# Patient Record
Sex: Male | Born: 1945 | Race: White | Hispanic: No | Marital: Married | Smoking: Former smoker
Health system: Southern US, Community
[De-identification: ages and names within clinical notes are randomized; demographics above are authoritative.]

## PROBLEM LIST (undated history)

## (undated) DIAGNOSIS — M1990 Unspecified osteoarthritis, unspecified site: Secondary | ICD-10-CM

## (undated) DIAGNOSIS — I639 Cerebral infarction, unspecified: Secondary | ICD-10-CM

## (undated) DIAGNOSIS — R519 Headache, unspecified: Secondary | ICD-10-CM

## (undated) DIAGNOSIS — J45909 Unspecified asthma, uncomplicated: Secondary | ICD-10-CM

## (undated) DIAGNOSIS — H269 Unspecified cataract: Secondary | ICD-10-CM

## (undated) DIAGNOSIS — T7840XA Allergy, unspecified, initial encounter: Secondary | ICD-10-CM

## (undated) HISTORY — PX: SHOULDER ARTHROSCOPY: SHX128

## (undated) HISTORY — PX: SKIN GRAFT FULL THICKNESS TRUNK: SUR1300

## (undated) HISTORY — DX: Unspecified asthma, uncomplicated: J45.909

## (undated) HISTORY — DX: Allergy, unspecified, initial encounter: T78.40XA

## (undated) HISTORY — DX: Unspecified cataract: H26.9

## (undated) HISTORY — DX: Cerebral infarction, unspecified: I63.9

## (undated) HISTORY — DX: Unspecified osteoarthritis, unspecified site: M19.90

## (undated) HISTORY — DX: Headache, unspecified: R51.9

## (undated) HISTORY — PX: VASECTOMY: SHX75

## (undated) HISTORY — PX: EYE SURGERY: SHX253

## (undated) HISTORY — PX: FRACTURE SURGERY: SHX138

---

## 2003-11-28 HISTORY — PX: BACK SURGERY: SHX140

## 2013-12-08 DIAGNOSIS — L84 Corns and callosities: Secondary | ICD-10-CM | POA: Diagnosis not present

## 2013-12-08 DIAGNOSIS — B351 Tinea unguium: Secondary | ICD-10-CM | POA: Diagnosis not present

## 2014-01-16 DIAGNOSIS — Z125 Encounter for screening for malignant neoplasm of prostate: Secondary | ICD-10-CM | POA: Diagnosis not present

## 2014-01-16 DIAGNOSIS — Z139 Encounter for screening, unspecified: Secondary | ICD-10-CM | POA: Diagnosis not present

## 2014-01-16 DIAGNOSIS — J310 Chronic rhinitis: Secondary | ICD-10-CM | POA: Diagnosis not present

## 2014-01-16 DIAGNOSIS — IMO0002 Reserved for concepts with insufficient information to code with codable children: Secondary | ICD-10-CM | POA: Diagnosis not present

## 2014-01-16 DIAGNOSIS — Z0000 Encounter for general adult medical examination without abnormal findings: Secondary | ICD-10-CM | POA: Diagnosis not present

## 2014-01-16 DIAGNOSIS — J45909 Unspecified asthma, uncomplicated: Secondary | ICD-10-CM | POA: Diagnosis not present

## 2014-01-16 DIAGNOSIS — D126 Benign neoplasm of colon, unspecified: Secondary | ICD-10-CM | POA: Diagnosis not present

## 2014-01-27 DIAGNOSIS — Z872 Personal history of diseases of the skin and subcutaneous tissue: Secondary | ICD-10-CM | POA: Diagnosis not present

## 2014-01-27 DIAGNOSIS — L723 Sebaceous cyst: Secondary | ICD-10-CM | POA: Diagnosis not present

## 2014-01-27 DIAGNOSIS — J45909 Unspecified asthma, uncomplicated: Secondary | ICD-10-CM | POA: Diagnosis not present

## 2014-01-27 DIAGNOSIS — D1801 Hemangioma of skin and subcutaneous tissue: Secondary | ICD-10-CM | POA: Diagnosis not present

## 2014-02-09 DIAGNOSIS — B351 Tinea unguium: Secondary | ICD-10-CM | POA: Diagnosis not present

## 2014-02-09 DIAGNOSIS — L84 Corns and callosities: Secondary | ICD-10-CM | POA: Diagnosis not present

## 2014-02-09 DIAGNOSIS — I70209 Unspecified atherosclerosis of native arteries of extremities, unspecified extremity: Secondary | ICD-10-CM | POA: Diagnosis not present

## 2014-02-20 DIAGNOSIS — H35369 Drusen (degenerative) of macula, unspecified eye: Secondary | ICD-10-CM | POA: Diagnosis not present

## 2014-02-20 DIAGNOSIS — H2510 Age-related nuclear cataract, unspecified eye: Secondary | ICD-10-CM | POA: Diagnosis not present

## 2014-02-20 DIAGNOSIS — H3530 Unspecified macular degeneration: Secondary | ICD-10-CM | POA: Diagnosis not present

## 2014-04-13 DIAGNOSIS — I70209 Unspecified atherosclerosis of native arteries of extremities, unspecified extremity: Secondary | ICD-10-CM | POA: Diagnosis not present

## 2014-04-13 DIAGNOSIS — J45909 Unspecified asthma, uncomplicated: Secondary | ICD-10-CM | POA: Diagnosis not present

## 2014-04-13 DIAGNOSIS — L84 Corns and callosities: Secondary | ICD-10-CM | POA: Diagnosis not present

## 2014-04-13 DIAGNOSIS — L905 Scar conditions and fibrosis of skin: Secondary | ICD-10-CM | POA: Diagnosis not present

## 2014-04-13 DIAGNOSIS — L723 Sebaceous cyst: Secondary | ICD-10-CM | POA: Diagnosis not present

## 2014-04-13 DIAGNOSIS — B351 Tinea unguium: Secondary | ICD-10-CM | POA: Diagnosis not present

## 2014-06-15 DIAGNOSIS — I70209 Unspecified atherosclerosis of native arteries of extremities, unspecified extremity: Secondary | ICD-10-CM | POA: Diagnosis not present

## 2014-06-15 DIAGNOSIS — B351 Tinea unguium: Secondary | ICD-10-CM | POA: Diagnosis not present

## 2014-06-15 DIAGNOSIS — L84 Corns and callosities: Secondary | ICD-10-CM | POA: Diagnosis not present

## 2014-07-09 DIAGNOSIS — J45909 Unspecified asthma, uncomplicated: Secondary | ICD-10-CM | POA: Diagnosis not present

## 2014-07-09 DIAGNOSIS — Z0000 Encounter for general adult medical examination without abnormal findings: Secondary | ICD-10-CM | POA: Diagnosis not present

## 2014-08-04 DIAGNOSIS — J45909 Unspecified asthma, uncomplicated: Secondary | ICD-10-CM | POA: Diagnosis not present

## 2014-08-18 DIAGNOSIS — B351 Tinea unguium: Secondary | ICD-10-CM | POA: Diagnosis not present

## 2014-08-18 DIAGNOSIS — M79609 Pain in unspecified limb: Secondary | ICD-10-CM | POA: Diagnosis not present

## 2014-09-12 DIAGNOSIS — Z23 Encounter for immunization: Secondary | ICD-10-CM | POA: Diagnosis not present

## 2014-10-20 DIAGNOSIS — B351 Tinea unguium: Secondary | ICD-10-CM | POA: Diagnosis not present

## 2014-10-20 DIAGNOSIS — M79675 Pain in left toe(s): Secondary | ICD-10-CM | POA: Diagnosis not present

## 2014-10-20 DIAGNOSIS — M79674 Pain in right toe(s): Secondary | ICD-10-CM | POA: Diagnosis not present

## 2014-12-22 DIAGNOSIS — M79674 Pain in right toe(s): Secondary | ICD-10-CM | POA: Diagnosis not present

## 2014-12-22 DIAGNOSIS — B351 Tinea unguium: Secondary | ICD-10-CM | POA: Diagnosis not present

## 2014-12-22 DIAGNOSIS — M79675 Pain in left toe(s): Secondary | ICD-10-CM | POA: Diagnosis not present

## 2015-01-18 DIAGNOSIS — Z125 Encounter for screening for malignant neoplasm of prostate: Secondary | ICD-10-CM | POA: Diagnosis not present

## 2015-01-18 DIAGNOSIS — Z0000 Encounter for general adult medical examination without abnormal findings: Secondary | ICD-10-CM | POA: Diagnosis not present

## 2015-01-18 DIAGNOSIS — Z139 Encounter for screening, unspecified: Secondary | ICD-10-CM | POA: Diagnosis not present

## 2015-01-18 DIAGNOSIS — D126 Benign neoplasm of colon, unspecified: Secondary | ICD-10-CM | POA: Diagnosis not present

## 2015-01-18 DIAGNOSIS — M5417 Radiculopathy, lumbosacral region: Secondary | ICD-10-CM | POA: Diagnosis not present

## 2015-01-18 DIAGNOSIS — Z23 Encounter for immunization: Secondary | ICD-10-CM | POA: Diagnosis not present

## 2015-01-18 DIAGNOSIS — J4530 Mild persistent asthma, uncomplicated: Secondary | ICD-10-CM | POA: Diagnosis not present

## 2015-02-22 DIAGNOSIS — H35363 Drusen (degenerative) of macula, bilateral: Secondary | ICD-10-CM | POA: Diagnosis not present

## 2015-02-23 DIAGNOSIS — B351 Tinea unguium: Secondary | ICD-10-CM | POA: Diagnosis not present

## 2015-02-23 DIAGNOSIS — M79675 Pain in left toe(s): Secondary | ICD-10-CM | POA: Diagnosis not present

## 2015-02-23 DIAGNOSIS — M79674 Pain in right toe(s): Secondary | ICD-10-CM | POA: Diagnosis not present

## 2015-02-25 DIAGNOSIS — K635 Polyp of colon: Secondary | ICD-10-CM | POA: Diagnosis not present

## 2015-02-25 DIAGNOSIS — D124 Benign neoplasm of descending colon: Secondary | ICD-10-CM | POA: Diagnosis not present

## 2015-02-25 DIAGNOSIS — D120 Benign neoplasm of cecum: Secondary | ICD-10-CM | POA: Diagnosis not present

## 2015-02-25 DIAGNOSIS — Z86010 Personal history of colonic polyps: Secondary | ICD-10-CM | POA: Diagnosis not present

## 2015-02-25 DIAGNOSIS — K648 Other hemorrhoids: Secondary | ICD-10-CM | POA: Diagnosis not present

## 2015-02-25 DIAGNOSIS — D123 Benign neoplasm of transverse colon: Secondary | ICD-10-CM | POA: Diagnosis not present

## 2015-05-04 DIAGNOSIS — B353 Tinea pedis: Secondary | ICD-10-CM | POA: Diagnosis not present

## 2015-05-04 DIAGNOSIS — B351 Tinea unguium: Secondary | ICD-10-CM | POA: Diagnosis not present

## 2015-05-04 DIAGNOSIS — M79675 Pain in left toe(s): Secondary | ICD-10-CM | POA: Diagnosis not present

## 2015-05-04 DIAGNOSIS — M79674 Pain in right toe(s): Secondary | ICD-10-CM | POA: Diagnosis not present

## 2015-07-06 DIAGNOSIS — M79675 Pain in left toe(s): Secondary | ICD-10-CM | POA: Diagnosis not present

## 2015-07-06 DIAGNOSIS — M79674 Pain in right toe(s): Secondary | ICD-10-CM | POA: Diagnosis not present

## 2015-07-06 DIAGNOSIS — B351 Tinea unguium: Secondary | ICD-10-CM | POA: Diagnosis not present

## 2015-07-06 DIAGNOSIS — B353 Tinea pedis: Secondary | ICD-10-CM | POA: Diagnosis not present

## 2015-08-17 DIAGNOSIS — H60501 Unspecified acute noninfective otitis externa, right ear: Secondary | ICD-10-CM | POA: Diagnosis not present

## 2015-08-18 DIAGNOSIS — Z23 Encounter for immunization: Secondary | ICD-10-CM | POA: Diagnosis not present

## 2015-09-01 DIAGNOSIS — Z139 Encounter for screening, unspecified: Secondary | ICD-10-CM | POA: Diagnosis not present

## 2015-09-01 DIAGNOSIS — Z6835 Body mass index (BMI) 35.0-35.9, adult: Secondary | ICD-10-CM | POA: Diagnosis not present

## 2015-09-01 DIAGNOSIS — H6121 Impacted cerumen, right ear: Secondary | ICD-10-CM | POA: Diagnosis not present

## 2015-09-01 DIAGNOSIS — H60331 Swimmer's ear, right ear: Secondary | ICD-10-CM | POA: Diagnosis not present

## 2015-10-25 ENCOUNTER — Ambulatory Visit (INDEPENDENT_AMBULATORY_CARE_PROVIDER_SITE_OTHER): Payer: Medicare Other

## 2015-10-25 ENCOUNTER — Encounter: Payer: Self-pay | Admitting: Family Medicine

## 2015-10-25 ENCOUNTER — Ambulatory Visit (INDEPENDENT_AMBULATORY_CARE_PROVIDER_SITE_OTHER): Payer: Medicare Other | Admitting: Family Medicine

## 2015-10-25 VITALS — BP 106/79 | HR 73 | Temp 98.3°F | Ht 70.0 in | Wt 252.4 lb

## 2015-10-25 DIAGNOSIS — M1711 Unilateral primary osteoarthritis, right knee: Secondary | ICD-10-CM | POA: Diagnosis not present

## 2015-10-25 DIAGNOSIS — M179 Osteoarthritis of knee, unspecified: Secondary | ICD-10-CM | POA: Diagnosis not present

## 2015-10-25 DIAGNOSIS — N400 Enlarged prostate without lower urinary tract symptoms: Secondary | ICD-10-CM | POA: Insufficient documentation

## 2015-10-25 DIAGNOSIS — K219 Gastro-esophageal reflux disease without esophagitis: Secondary | ICD-10-CM | POA: Insufficient documentation

## 2015-10-25 DIAGNOSIS — M7522 Bicipital tendinitis, left shoulder: Secondary | ICD-10-CM | POA: Diagnosis not present

## 2015-10-25 DIAGNOSIS — J45909 Unspecified asthma, uncomplicated: Secondary | ICD-10-CM | POA: Insufficient documentation

## 2015-10-25 IMAGING — CR DG KNEE 1-2V*R*
2 series · 2 of 2 positions shown · non-contrast
Comparison: None.

CLINICAL DATA: Right knee pain.  No known injury.

EXAM:
RIGHT KNEE - 1-2 VIEW

[view not recorded (1 of 2)]
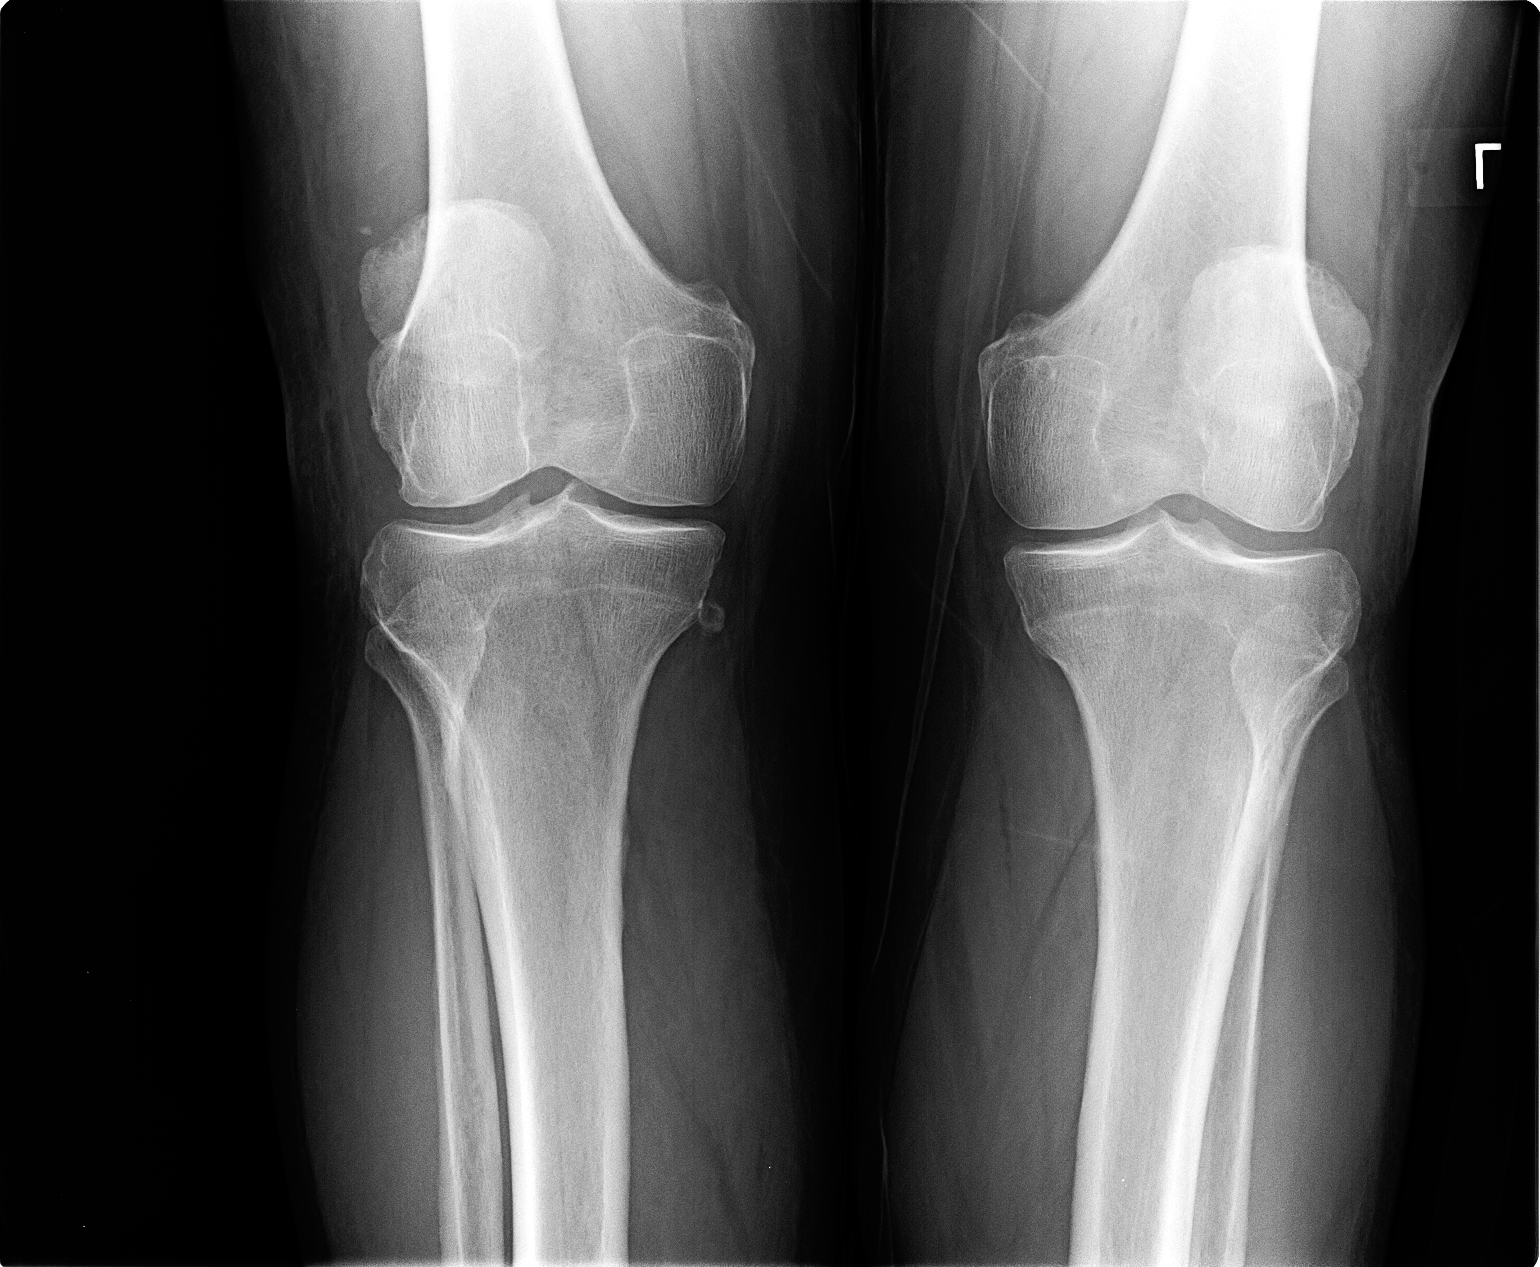

[view not recorded (2 of 2)]
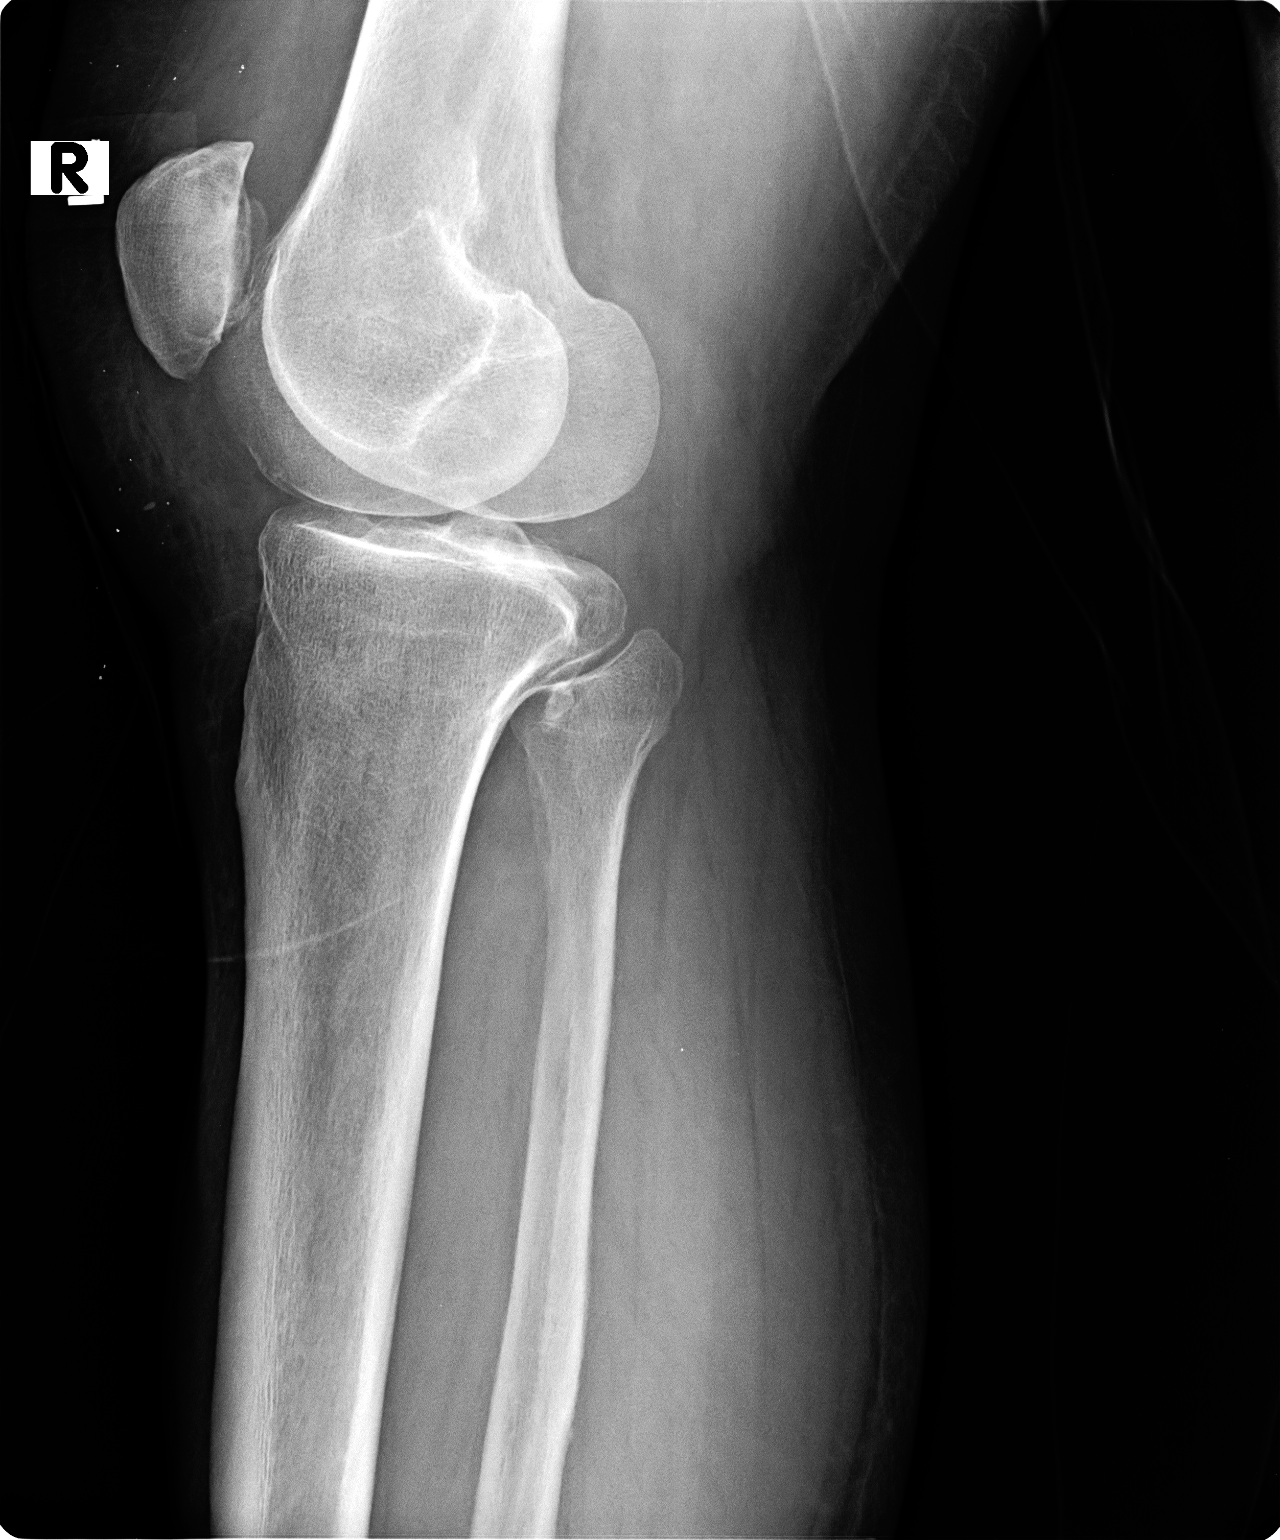

[2 of 2 positions shown; findings below may reference images not displayed]

FINDINGS: No fracture. No bone lesion. The femoral tibial joint space
compartments are relatively well preserved.

There is mild subchondral sclerosis and cystic change along the
dorsal margin of the patella. Mild edema suggested in the
infrapatellar fat.

No convincing joint effusion. Nonspecific calcification lies along
the posterior medial margin of the proximal tibia, likely vascular.
IMPRESSION: 1. Mild arthropathic changes of the patellofemoral joint space
compartment.
2. No fracture or bone lesion.  No convincing joint effusion.

## 2015-10-25 MED ORDER — METHYLPREDNISOLONE ACETATE 80 MG/ML IJ SUSP
40.0000 mg | Freq: Once | INTRAMUSCULAR | Status: AC
  Administered 2015-10-25: 40 mg via INTRAMUSCULAR

## 2015-10-25 NOTE — Progress Notes (Signed)
BP 106/79 mmHg  Pulse 73  Temp(Src) 98.3 F (36.8 C) (Oral)  Ht 5\' 10"  (1.778 m)  Wt 252 lb 6.4 oz (114.488 kg)  BMI 36.22 kg/m2   Subjective:    [name], male    DOB: [birth year], 69 y.o.   MRN: [id number]  HPI: Craig Hurley is a 69 y.o. male presenting on 10/25/2015 for Establish Care; Knee Pain; and Shoulder Pain   HPI Right knee pain Patient has had off and on recurrent right knee pain that is been worsening more recently. He had about 10 years ago an arthroscopic procedure on that knee for his meniscus. Since that point he has been having troubles with his knee off and on. Now he is having troubles with ambulating and pressure behind his knee and on the lateral side of his knee. The pain is worse with going down stairs or getting up and down from a sitting position. He denies any fevers or chills or skin changes.  Shoulder pain Patient has anterior left shoulder pain that started 1 week ago and was worsening and had trouble lifting his arm up over his head. He was taking his Celebrex and or meloxicam and it has improved. He denies any skin injuries use, denies any fevers or chills.  Relevant past medical, surgical, family and social history reviewed and updated as indicated. Interim medical history since our last visit reviewed. Allergies and medications reviewed and updated.  Review of Systems  Constitutional: Negative for fever and chills.  HENT: Negative for ear discharge and ear pain.   Eyes: Negative for discharge and visual disturbance.  Respiratory: Negative for shortness of breath and wheezing.   Cardiovascular: Negative for chest pain and leg swelling.  Gastrointestinal: Negative for abdominal pain, diarrhea and constipation.  Genitourinary: Negative for difficulty urinating.  Musculoskeletal: Positive for myalgias, joint swelling and arthralgias. Negative for back pain and gait problem.  Skin: Negative for color change and rash.    Neurological: Negative for syncope, light-headedness and headaches.  All other systems reviewed and are negative.   Per HPI unless specifically indicated above  Social History   Social History  . Marital Status: Married    Spouse Name: N/A  . Number of Children: N/A  . Years of Education: N/A   Occupational History  . Not on file.   Social History Main Topics  . Smoking status: Current Some Day Smoker    Types: Cigars  . Smokeless tobacco: Current User    Types: Snuff  . Alcohol Use: No  . Drug Use: No  . Sexual Activity: Yes   Other Topics Concern  . Not on file   Social History Narrative  . No narrative on file    Past Surgical History  Procedure Laterality Date  . Fracture surgery Right   . Shoulder arthroscopy Right   . Back surgery  2005    lumbar  . Vasectomy      Family History  Problem Relation Age of Onset  . Adopted: Yes  . Cancer Mother     colon cancer      Medication List       This list is accurate as of: 10/25/15  9:28 AM.  Always use your most recent med list.               albuterol 108 (90 BASE) MCG/ACT inhaler  Commonly known as:  PROVENTIL HFA;VENTOLIN HFA  Inhale 2 puffs into the lungs every 4 (four) hours as  needed for wheezing or shortness of breath.     cetirizine 10 MG tablet  Commonly known as:  ZYRTEC  Take 10 mg by mouth daily.     doxazosin 4 MG tablet  Commonly known as:  CARDURA  Take 4 mg by mouth daily.     EYE VITAMINS PO  Take by mouth.     fluticasone 50 MCG/ACT nasal spray  Commonly known as:  FLONASE  Place 2 sprays into both nostrils daily.     Fluticasone-Salmeterol 250-50 MCG/DOSE Aepb  Commonly known as:  ADVAIR  Inhale 1 puff into the lungs 2 (two) times daily.     meloxicam 15 MG tablet  Commonly known as:  MOBIC  Take 15 mg by mouth daily.     montelukast 10 MG tablet  Commonly known as:  SINGULAIR  Take 10 mg by mouth at bedtime.     omeprazole 20 MG capsule  Commonly known as:   PRILOSEC  Take 20 mg by mouth daily.     vitamin A 8000 UNIT capsule  Take 8,000 Units by mouth daily.           Objective:    BP 106/79 mmHg  Pulse 73  Temp(Src) 98.3 F (36.8 C) (Oral)  Ht 5\' 10"  (1.778 m)  Wt 252 lb 6.4 oz (114.488 kg)  BMI 36.22 kg/m2  Wt Readings from Last 3 Encounters:  10/25/15 252 lb 6.4 oz (114.488 kg)    Physical Exam  Constitutional: He is oriented to person, place, and time. He appears well-developed and well-nourished. No distress.  Eyes: Conjunctivae and EOM are normal. Pupils are equal, round, and reactive to light. Right eye exhibits no discharge. No scleral icterus.  Neck: Neck supple. No thyromegaly present.  Cardiovascular: Normal rate, regular rhythm, normal heart sounds and intact distal pulses.   No murmur heard. Pulmonary/Chest: Effort normal and breath sounds normal. No respiratory distress. He has no wheezes.  Musculoskeletal: Normal range of motion. He exhibits no edema.       Right shoulder: He exhibits normal range of motion, no tenderness, no bony tenderness, no swelling, no effusion, no crepitus, no deformity, normal pulse and normal strength.       Left shoulder: He exhibits tenderness (tenderness over the long head of the biceps tendon anteriorly.). He exhibits normal range of motion, no bony tenderness, no swelling, no effusion, no deformity, no spasm and normal pulse.  Lymphadenopathy:    He has no cervical adenopathy.  Neurological: He is alert and oriented to person, place, and time. Coordination normal.  Skin: Skin is warm and dry. No rash noted. He is not diaphoretic.  Psychiatric: He has a normal mood and affect. His behavior is normal.  Vitals reviewed.   Knee x-ray: Bilateral knee showed joint space narrowing and osteophytes consistent with osteoarthritis. Narrowing is worse on medial aspect.  Knee injection: Risk factors of bleeding and infection discussed with patient and patient is agreeable towards injection.  Patient prepped with Betadine. Lateral approach towards injection used. Injected 40 mg of Depo-Medrol and 1 mL of 2% lidocaine. Patient tolerated procedure well and no side effects from noted. Minimal to no bleeding. Simple bandage applied after.  No results found for this or any previous visit.    Assessment & Plan:   Problem List Items Addressed This Visit    None    Visit Diagnoses    Osteoarthritis of right knee, unspecified osteoarthritis type    -  Primary  We'll do x-ray, if no major abnormalities we'll do an injection.    Relevant Medications    meloxicam (MOBIC) 15 MG tablet    methylPREDNISolone acetate (DEPO-MEDROL) injection 40 mg    Other Relevant Orders    DG Knee 1-2 Views Right    Biceps tendinitis, left        Biceps tendinitis has improved, continue taking anti-inflammatory        Follow up plan: Return in about 2 weeks (around 11/08/2015), or if symptoms worsen or fail to improve, for Establish care and do screening labs, come in fasting.  Caryl Pina, MD [facility] 10/25/2015, 9:28 AM

## 2015-11-08 ENCOUNTER — Ambulatory Visit (INDEPENDENT_AMBULATORY_CARE_PROVIDER_SITE_OTHER): Payer: Medicare Other | Admitting: Family Medicine

## 2015-11-08 ENCOUNTER — Encounter: Payer: Self-pay | Admitting: Family Medicine

## 2015-11-08 VITALS — BP 107/73 | HR 81 | Temp 97.4°F | Ht 70.0 in | Wt 244.4 lb

## 2015-11-08 DIAGNOSIS — E785 Hyperlipidemia, unspecified: Secondary | ICD-10-CM

## 2015-11-08 DIAGNOSIS — K219 Gastro-esophageal reflux disease without esophagitis: Secondary | ICD-10-CM | POA: Diagnosis not present

## 2015-11-08 DIAGNOSIS — N400 Enlarged prostate without lower urinary tract symptoms: Secondary | ICD-10-CM | POA: Diagnosis not present

## 2015-11-08 DIAGNOSIS — B370 Candidal stomatitis: Secondary | ICD-10-CM

## 2015-11-08 DIAGNOSIS — Z125 Encounter for screening for malignant neoplasm of prostate: Secondary | ICD-10-CM

## 2015-11-08 DIAGNOSIS — R7309 Other abnormal glucose: Secondary | ICD-10-CM | POA: Diagnosis not present

## 2015-11-08 DIAGNOSIS — J439 Emphysema, unspecified: Secondary | ICD-10-CM | POA: Diagnosis not present

## 2015-11-08 DIAGNOSIS — Z1159 Encounter for screening for other viral diseases: Secondary | ICD-10-CM | POA: Diagnosis not present

## 2015-11-08 DIAGNOSIS — M1711 Unilateral primary osteoarthritis, right knee: Secondary | ICD-10-CM

## 2015-11-08 DIAGNOSIS — M179 Osteoarthritis of knee, unspecified: Secondary | ICD-10-CM | POA: Diagnosis not present

## 2015-11-08 MED ORDER — CETIRIZINE HCL 10 MG PO TABS: 10.0000 mg | ORAL_TABLET | Freq: Every day | ORAL | Status: DC

## 2015-11-08 MED ORDER — MONTELUKAST SODIUM 10 MG PO TABS: 10.0000 mg | ORAL_TABLET | Freq: Every day | ORAL | Status: DC

## 2015-11-08 MED ORDER — FLUTICASONE-SALMETEROL 250-50 MCG/DOSE IN AEPB: 1.0000 | INHALATION_SPRAY | Freq: Two times a day (BID) | RESPIRATORY_TRACT | Status: DC

## 2015-11-08 MED ORDER — DOXAZOSIN MESYLATE 4 MG PO TABS: 4.0000 mg | ORAL_TABLET | Freq: Every day | ORAL | Status: DC

## 2015-11-08 MED ORDER — FLUTICASONE PROPIONATE 50 MCG/ACT NA SUSP: 2.0000 | Freq: Every day | NASAL | Status: DC

## 2015-11-08 MED ORDER — OMEPRAZOLE 20 MG PO CPDR: 20.0000 mg | DELAYED_RELEASE_CAPSULE | Freq: Every day | ORAL | Status: DC

## 2015-11-08 MED ORDER — FLUCONAZOLE 100 MG PO TABS: 100.0000 mg | ORAL_TABLET | Freq: Every day | ORAL | Status: DC

## 2015-11-08 MED ORDER — ALBUTEROL SULFATE HFA 108 (90 BASE) MCG/ACT IN AERS: 2.0000 | INHALATION_SPRAY | RESPIRATORY_TRACT | Status: DC | PRN

## 2015-11-08 MED ORDER — CELECOXIB 200 MG PO CAPS: 200.0000 mg | ORAL_CAPSULE | Freq: Every day | ORAL | Status: DC

## 2015-11-08 NOTE — Progress Notes (Signed)
BP 107/73 mmHg  Pulse 81  Temp(Src) 97.4 F (36.3 C) (Oral)  Ht 5' 10"  (1.778 m)  Wt 244 lb 6.4 oz (110.859 kg)  BMI 35.07 kg/m2   Subjective:    [name], male    DOB: 21-Sep-1946, 69 y.o.   MRN: [id number]  HPI: Craig Hurley is a 69 y.o. male presenting on 11/08/2015 for Establish Care and Labwork   HPI COPD Patient has been diagnosed with COPD/asthma about 5 years ago. He has been a chronic smoker his whole life. He has both Advair and rescue inhaler. Currently he is using his rescue inhaler less than once a month. He denies any regular nighttime symptoms or daytime wheezing or coughing spells. He does have a cough intermittently that is nonproductive but it does not limit him. He denies any shortness of breath or chest pain.  GERD He has had severe GERD in the past and has been on omeprazole for this. It has been controlled on omeprazole and he denies any symptoms of abdominal pain or heartburn currently.  BPH He takes doxazosin for his BPH he gets up twice a night to urinate but feels like his stream is doing really well and is not having issues with starting or stopping it. He does not feel like he can't empty his bladder.  Hyperlipidemia Patient has been diagnosed with hyperlipidemia in the past, he is not currently on any medications for it. He is due for a lab check today and we will check his levels and see where he is at.  Right knee pain Patient has persistent right knee pain that is improved after the injection but is still there all the time. The sharp pain is mostly resolved but the dull achy pain persists. He feels like he is at a point where he is ready to go see the orthopedic surgeon again. In his history he has had 2 arthroscopic procedures on that knee as well.  Mouth sores Patient did not know that he had sores in his mouth and does not feel any pain from this. He did not know they were there before exam.  Relevant past medical,  surgical, family and social history reviewed and updated as indicated. Interim medical history since our last visit reviewed. Allergies and medications reviewed and updated.  Review of Systems  Constitutional: Negative for fever and chills.  HENT: Negative for congestion, ear discharge, ear pain, mouth sores and sore throat.   Eyes: Negative for discharge and visual disturbance.  Respiratory: Negative for cough, chest tightness, shortness of breath and wheezing.   Cardiovascular: Negative for chest pain and leg swelling.  Gastrointestinal: Negative for abdominal pain, diarrhea and constipation.  Genitourinary: Negative for difficulty urinating.  Musculoskeletal: Positive for myalgias, joint swelling and arthralgias (some improvement after injection but still persists with dull achy pain). Negative for back pain and gait problem.  Skin: Negative for color change and rash.  Neurological: Negative for dizziness, syncope, light-headedness and headaches.  All other systems reviewed and are negative.   Per HPI unless specifically indicated above     Medication List       This list is accurate as of: 11/08/15  9:53 AM.  Always use your most recent med list.               acetaminophen 325 MG tablet  Commonly known as:  TYLENOL  Take 650 mg by mouth every 6 (six) hours as needed.     albuterol 108 (90  BASE) MCG/ACT inhaler  Commonly known as:  PROVENTIL HFA;VENTOLIN HFA  Inhale 2 puffs into the lungs every 4 (four) hours as needed for wheezing or shortness of breath.     celecoxib 200 MG capsule  Commonly known as:  CELEBREX  Take 1 capsule (200 mg total) by mouth daily.     cetirizine 10 MG tablet  Commonly known as:  ZYRTEC  Take 1 tablet (10 mg total) by mouth daily.     doxazosin 4 MG tablet  Commonly known as:  CARDURA  Take 1 tablet (4 mg total) by mouth daily.     EYE VITAMINS PO  Take by mouth.     fluconazole 100 MG tablet  Commonly known as:  DIFLUCAN  Take 1  tablet (100 mg total) by mouth daily. Take 2 on first day and then one per day     fluticasone 50 MCG/ACT nasal spray  Commonly known as:  FLONASE  Place 2 sprays into both nostrils daily.     Fluticasone-Salmeterol 250-50 MCG/DOSE Aepb  Commonly known as:  ADVAIR  Inhale 1 puff into the lungs 2 (two) times daily.     meloxicam 15 MG tablet  Commonly known as:  MOBIC  Take 15 mg by mouth daily.     montelukast 10 MG tablet  Commonly known as:  SINGULAIR  Take 1 tablet (10 mg total) by mouth at bedtime.     omeprazole 20 MG capsule  Commonly known as:  PRILOSEC  Take 1 capsule (20 mg total) by mouth daily.     vitamin A 8000 UNIT capsule  Take 8,000 Units by mouth daily.           Objective:    BP 107/73 mmHg  Pulse 81  Temp(Src) 97.4 F (36.3 C) (Oral)  Ht 5' 10"  (1.778 m)  Wt 244 lb 6.4 oz (110.859 kg)  BMI 35.07 kg/m2  Wt Readings from Last 3 Encounters:  11/08/15 244 lb 6.4 oz (110.859 kg)  10/25/15 252 lb 6.4 oz (114.488 kg)    Physical Exam  Constitutional: He is oriented to person, place, and time. He appears well-developed and well-nourished. No distress.  HENT:  Right Ear: Tympanic membrane, external ear and ear canal normal.  Left Ear: Tympanic membrane, external ear and ear canal normal.  Nose: Nose normal.  Mouth/Throat: Uvula is midline. Oropharyngeal exudate (appears to have oral thrush on his upper palate, likely from Advair.) present.  Has bilateral hearing aids  Eyes: Conjunctivae and EOM are normal. Pupils are equal, round, and reactive to light. Right eye exhibits no discharge. No scleral icterus.  Neck: Neck supple. No thyromegaly present.  Cardiovascular: Normal rate, regular rhythm, normal heart sounds and intact distal pulses.   No murmur heard. Pulmonary/Chest: Effort normal and breath sounds normal. No respiratory distress. He has no wheezes.  Musculoskeletal: Normal range of motion. He exhibits no edema.       Right shoulder: He  exhibits normal range of motion, no tenderness, no bony tenderness, no swelling, no effusion, no crepitus, no deformity, normal pulse and normal strength.       Left shoulder: He exhibits tenderness (tenderness over the long head of the biceps tendon anteriorly.). He exhibits normal range of motion, no bony tenderness, no swelling, no effusion, no deformity, no spasm and normal pulse.  Lymphadenopathy:    He has no cervical adenopathy.  Neurological: He is alert and oriented to person, place, and time. Coordination normal.  Skin: Skin is warm  and dry. No rash noted. He is not diaphoretic.  Psychiatric: He has a normal mood and affect. His behavior is normal.  Vitals reviewed.   No results found for this or any previous visit.    Assessment & Plan:   Problem List Items Addressed This Visit      Respiratory   COPD (chronic obstructive pulmonary disease) with emphysema (HCC)   Relevant Medications   montelukast (SINGULAIR) 10 MG tablet   Fluticasone-Salmeterol (ADVAIR) 250-50 MCG/DOSE AEPB   fluticasone (FLONASE) 50 MCG/ACT nasal spray   cetirizine (ZYRTEC) 10 MG tablet   albuterol (PROVENTIL HFA;VENTOLIN HFA) 108 (90 BASE) MCG/ACT inhaler     Digestive   GERD (gastroesophageal reflux disease)   Relevant Medications   omeprazole (PRILOSEC) 20 MG capsule     Genitourinary   BPH (benign prostatic hyperplasia)   Relevant Orders   PSA, total and free    Other Visit Diagnoses    Screening PSA (prostate specific antigen)    -  Primary    Hyperlipidemia        Relevant Medications    doxazosin (CARDURA) 4 MG tablet    Other Relevant Orders    CMP14+EGFR    Lipid panel    PSA, total and free    CBC with Differential/Platelet    Need for hepatitis C screening test        Relevant Orders    Hepatitis C antibody    Osteoarthritis of right knee, unspecified osteoarthritis type        Relevant Medications    acetaminophen (TYLENOL) 325 MG tablet    celecoxib (CELEBREX) 200 MG  capsule    Other Relevant Orders    Ambulatory referral to Orthopedic Surgery    Oral thrush        Relevant Medications    fluconazole (DIFLUCAN) 100 MG tablet         Follow up plan: Return in about 6 months (around 05/08/2016), or if symptoms worsen or fail to improve, for recheck COPD GERD.  Counseling provided for all of the vaccine components Orders Placed This Encounter  Procedures  . CMP14+EGFR  . Lipid panel  . PSA, total and free  . CBC with Differential/Platelet  . Hepatitis C antibody  . Ambulatory referral to [place] Tristina Sahagian, MD [facility] 11/08/2015, 9:53 AM

## 2015-11-09 DIAGNOSIS — M1711 Unilateral primary osteoarthritis, right knee: Secondary | ICD-10-CM | POA: Diagnosis not present

## 2015-11-09 DIAGNOSIS — M7631 Iliotibial band syndrome, right leg: Secondary | ICD-10-CM | POA: Diagnosis not present

## 2015-11-09 DIAGNOSIS — M7050 Other bursitis of knee, unspecified knee: Secondary | ICD-10-CM | POA: Diagnosis not present

## 2015-11-09 LAB — CMP14+EGFR
A/G RATIO: 1.7 (ref 1.1–2.5)
ALBUMIN: 4.2 g/dL (ref 3.5–4.8)
ALT: 11 IU/L (ref 0–44)
AST: 11 IU/L (ref 0–40)
Alkaline Phosphatase: 98 IU/L (ref 39–117)
BUN / CREAT RATIO: 18 (ref 10–22)
BUN: 18 mg/dL (ref 8–27)
Bilirubin Total: 0.4 mg/dL (ref 0.0–1.2)
CALCIUM: 9.2 mg/dL (ref 8.6–10.2)
CO2: 24 mmol/L (ref 18–29)
CREATININE: 0.98 mg/dL (ref 0.76–1.27)
Chloride: 97 mmol/L (ref 96–106)
GFR, EST AFRICAN AMERICAN: 90 mL/min/{1.73_m2} (ref >59)
GFR, EST NON AFRICAN AMERICAN: 78 mL/min/{1.73_m2} (ref >59)
GLOBULIN, TOTAL: 2.5 g/dL (ref 1.5–4.5)
Glucose: 108 mg/dL — ABNORMAL HIGH (ref 65–99)
Potassium: 4.7 mmol/L (ref 3.5–5.2)
SODIUM: 137 mmol/L (ref 134–144)
TOTAL PROTEIN: 6.7 g/dL (ref 6.0–8.5)

## 2015-11-09 LAB — LIPID PANEL
CHOL/HDL RATIO: 3.7 ratio (ref 0.0–5.0)
Cholesterol, Total: 185 mg/dL (ref 100–199)
HDL: 50 mg/dL (ref >39)
LDL CALC: 120 mg/dL — AB (ref 0–99)
TRIGLYCERIDES: 74 mg/dL (ref 0–149)
VLDL Cholesterol Cal: 15 mg/dL (ref 5–40)

## 2015-11-09 LAB — CBC WITH DIFFERENTIAL/PLATELET
Basophils Absolute: 0 10*3/uL (ref 0.0–0.2)
Basos: 0 %
EOS (ABSOLUTE): 0.1 10*3/uL (ref 0.0–0.4)
Eos: 1 %
HEMATOCRIT: 45.9 % (ref 37.5–51.0)
HEMOGLOBIN: 15.8 g/dL (ref 12.6–17.7)
IMMATURE GRANS (ABS): 0 10*3/uL (ref 0.0–0.1)
IMMATURE GRANULOCYTES: 0 %
LYMPHS ABS: 1.5 10*3/uL (ref 0.7–3.1)
Lymphs: 20 %
MCH: 28.3 pg (ref 26.6–33.0)
MCHC: 34.4 g/dL (ref 31.5–35.7)
MCV: 82 fL (ref 79–97)
MONOCYTES: 9 %
MONOS ABS: 0.7 10*3/uL (ref 0.1–0.9)
NEUTROS ABS: 5.2 10*3/uL (ref 1.4–7.0)
Neutrophils: 70 %
PLATELETS: 219 10*3/uL (ref 150–379)
RBC: 5.58 x10E6/uL (ref 4.14–5.80)
RDW: 14.8 % (ref 12.3–15.4)
WBC: 7.5 10*3/uL (ref 3.4–10.8)

## 2015-11-09 LAB — HEPATITIS C ANTIBODY: Hep C Virus Ab: <0.1 s/co ratio (ref 0.0–0.9)

## 2015-11-09 LAB — PSA, TOTAL AND FREE
PROSTATE SPECIFIC AG, SERUM: 1.7 ng/mL (ref 0.0–4.0)
PSA FREE PCT: 28.8 %
PSA, Free: 0.49 ng/mL

## 2015-11-10 ENCOUNTER — Telehealth: Payer: Self-pay

## 2015-11-10 NOTE — Telephone Encounter (Signed)
Insurance denied Celecoxib non formulary   Formulary are Naproxen, diclofenac and nabumetone

## 2015-11-10 NOTE — Telephone Encounter (Signed)
Send naproxen 250 mg 3 times a day when necessary. Send enough for a month with 1 refill

## 2015-11-11 LAB — HGB A1C W/O EAG: HEMOGLOBIN A1C: 5.9 % — AB (ref 4.8–5.6)

## 2015-11-11 LAB — SPECIMEN STATUS REPORT

## 2015-11-11 MED ORDER — NAPROXEN 250 MG PO TABS: 250.0000 mg | ORAL_TABLET | Freq: Three times a day (TID) | ORAL | Status: DC

## 2015-11-11 NOTE — Addendum Note (Signed)
Addended by: Marylin Crosby on: 11/11/2015 09:16 AM   Modules accepted: Orders

## 2015-11-23 ENCOUNTER — Ambulatory Visit: Payer: Medicare Other | Attending: Orthopedic Surgery | Admitting: Physical Therapy

## 2015-11-23 DIAGNOSIS — M25561 Pain in right knee: Secondary | ICD-10-CM | POA: Diagnosis present

## 2015-11-23 NOTE — Therapy (Signed)
[facility] [place], [place], [address] Phone: [phone]   Fax:  [phone]  Physical Therapy Evaluation  Patient Details  Name: Craig Hurley MRN: [id number] Date of Birth: [birth year] Referring Provider: Rod Can MD.  Encounter Date: 11/23/2015      PT End of Session - 11/23/15 1017    Visit Number 1   Number of Visits 12   Date for PT Re-Evaluation 01/04/16   PT Start Time 0945   Activity Tolerance Patient tolerated treatment well   Behavior During Therapy [facility] for tasks assessed/performed      Past Medical History  Diagnosis Date  . Asthma     Past Surgical History  Procedure Laterality Date  . Fracture surgery Right   . Shoulder arthroscopy Right   . Back surgery  2005    lumbar  . Vasectomy      There were no vitals filed for this visit.  Visit Diagnosis:  Right knee pain - Plan: PT plan of care cert/re-cert      Subjective Assessment - 11/23/15 0957    Subjective Pain at rest 3-4/10.   Patient Stated Goals Get out of pain.   Currently in Pain? Yes   Pain Score 4    Pain Location Knee   Pain Orientation Right   Pain Descriptors / Indicators Aching   Pain Type Chronic pain   Pain Onset More than a month ago   Pain Frequency Constant   Aggravating Factors  Prolonged driving makes it worse.   Pain Relieving Factors Rest.            [facility] PT Assessment - 11/23/15 0001    Assessment   Medical Diagnosis Pes Anersine bursitis.   Referring Provider Rod Can MD.   Onset Date/Surgical Date --  Ongoing.   Precautions   Precautions None   Restrictions   Weight Bearing Restrictions No   Balance Screen   Has the patient fallen in the past 6 months No   Has the patient had a decrease in activity level because of a fear of falling?  No   Is the patient reluctant to leave their home because of a fear of falling?  No   Home [employer] residence   Prior Function   Level of Independence Independent   Posture/Postural Control   Posture/Postural Control No significant limitations   ROM / Strength   AROM / PROM / Strength AROM;Strength   AROM   Overall AROM Comments Normal right knee active range of motion.   Strength   Overall Strength Comments Normal right LE strength.   Palpation   Palpation comment Tender to palpation over right lateral IT bursa and pain reported in area of right popliteal fossa.   Ambulation/Gait   Gait Comments WNL.                   [facility] Adult PT Treatment/Exercise - 11/23/15 0001    Modalities   Modalities Electrical Stimulation   Electrical Stimulation   Electrical Stimulation Location Right distal lateral knee.   Electrical Stimulation Action pre-mod e'stim x 20 minutes 80-150 HZ.   Electrical Stimulation Goals Pain                  PT Short Term Goals - 11/23/15 1014    PT SHORT TERM GOAL #1   Title Ind with a HEP.   Time 2   Period Weeks   Status New  PT Long Term Goals - 12-07-2015 1015    PT LONG TERM GOAL #1   Title Perform ADL's with pain not > 3/10.   Time 4   Period Weeks   Status New   PT LONG TERM GOAL #2   Title Go up and down a flight of stairs with pain not > 3/10.   Time 4   Period Weeks   Status New   PT LONG TERM GOAL #3   Title Drive 45 minutes with right knee pain-level not > 3-4/10.   Time 4   Period Weeks   Status New               Plan - Dec 07, 2015 0959    Clinical Impression Statement The patient has a long history of right knee pain.  His resting pain-level is a 4/10 today but can rise to a 6-7/10 after a long drive and then getting up.  He states he has had back problems in the past and feels some numbness on the outsode ofhis right knee at times.   Pt will benefit from skilled therapeutic intervention in order to improve on the following deficits Pain;Decreased activity tolerance   Rehab Potential Excellent   PT  Frequency 3x / week   PT Duration 4 weeks   PT Treatment/Interventions ADLs/Self Care Home Management;Electrical Stimulation;Cryotherapy;Moist Heat;Ultrasound;Therapeutic activities;Therapeutic exercise;Manual techniques;Patient/family education   PT Next Visit Plan IASTM/STW/M to right lateral distal knee f/b Combo e'stim/U/S and pain-free right quadriceps strengthening.   Consulted and Agree with Plan of Care Patient          G-Codes - 12-07-2015 1016    Functional Assessment Tool Used FOTO.   Functional Limitation Mobility: Walking and moving around   Mobility: Walking and Moving Around Current Status [phone]) At least 40 percent but less than 60 percent impaired, limited or restricted   Mobility: Walking and Moving Around Goal Status [phone]) At least 1 percent but less than 20 percent impaired, limited or restricted       Problem List Patient Active Problem List   Diagnosis Date Noted  . COPD (chronic obstructive pulmonary disease) with emphysema ([facility]) 11/08/2015  . BPH (benign prostatic hyperplasia) 10/25/2015  . GERD (gastroesophageal reflux disease) 10/25/2015    Jaydalee Bardwell, [place] MPT December 07, 2015, 10:33 AM  [facility] [place], [place], [address] Phone: [phone]   Fax:  [phone]  Name: Craig Hurley MRN: [id number] Date of Birth: [birth year]

## 2015-11-30 ENCOUNTER — Ambulatory Visit: Payer: Medicare Other | Attending: Orthopedic Surgery | Admitting: Physical Therapy

## 2015-11-30 DIAGNOSIS — M25561 Pain in right knee: Secondary | ICD-10-CM | POA: Diagnosis not present

## 2015-11-30 NOTE — Therapy (Signed)
[facility] [place], [place], [address] Phone: [phone]   Fax:  [phone]  Physical Therapy Treatment  Patient Details  Name: Craig Hurley MRN: [id number] Date of Birth: 11-28-1945 Referring Provider: Rod Can MD.  Encounter Date: 11/30/2015      PT End of Session - 11/30/15 1113    Visit Number 2   Number of Visits 12   Date for PT Re-Evaluation 01/04/16   PT Start Time 1030   PT Stop Time 1127   PT Time Calculation (min) 57 min   Activity Tolerance Patient tolerated treatment well   Behavior During Therapy [facility] for tasks assessed/performed      Past Medical History  Diagnosis Date  . Asthma     Past Surgical History  Procedure Laterality Date  . Fracture surgery Right   . Shoulder arthroscopy Right   . Back surgery  2005    lumbar  . Vasectomy      There were no vitals filed for this visit.  Visit Diagnosis:  Right knee pain      Subjective Assessment - 11/30/15 1110    Subjective Myt kne is sore to touch but is better.   Patient Stated Goals Get out of pain.   Pain Score 2    Pain Location Knee   Pain Orientation Right   Pain Descriptors / Indicators Aching   Pain Type Chronic pain   Pain Onset More than a month ago                         [facility] Adult PT Treatment/Exercise - 11/30/15 0001    Exercises   Exercises Knee/Hip   Knee/Hip Exercises: Aerobic   Nustep Level 3 x 15 minutes.   Modalities   Modalities Electrical Stimulation;Ultrasound   Electrical Stimulation   Electrical Stimulation Location Right distal lateral knee.   Electrical Stimulation Action Pre-mod constant at 80-150 HZ x 20 minutes.   Electrical Stimulation Goals Pain   Ultrasound   Ultrasound Location Right lateral distal knee.   Ultrasound Parameters 1.50 W/CM2 at 1.50 W/CM2 x 8 minutes.                  PT Short Term Goals - 11/23/15 1014    PT SHORT TERM GOAL #1   Title  Ind with a HEP.   Time 2   Period Weeks   Status New           PT Long Term Goals - 11/23/15 1015    PT LONG TERM GOAL #1   Title Perform ADL's with pain not > 3/10.   Time 4   Period Weeks   Status New   PT LONG TERM GOAL #2   Title Go up and down a flight of stairs with pain not > 3/10.   Time 4   Period Weeks   Status New   PT LONG TERM GOAL #3   Title Drive 45 minutes with right knee pain-level not > 3-4/10.   Time 4   Period Weeks   Status New               Problem List Patient Active Problem List   Diagnosis Date Noted  . COPD (chronic obstructive pulmonary disease) with emphysema ([place]) 11/08/2015  . BPH (benign prostatic hyperplasia) 10/25/2015  . GERD (gastroesophageal reflux disease) 10/25/2015    Glendel Jaggers, [place] MPT 11/30/2015, 11:47 AM  [facility] [address]  [place], [place], [address] Phone: [phone]   Fax:  [phone]  Name: Craig Hurley MRN: [id number] Date of Birth: Aug 15, 1946

## 2015-12-02 DIAGNOSIS — L110 Acquired keratosis follicularis: Secondary | ICD-10-CM | POA: Diagnosis not present

## 2015-12-02 DIAGNOSIS — M79672 Pain in left foot: Secondary | ICD-10-CM | POA: Diagnosis not present

## 2015-12-02 DIAGNOSIS — M2042 Other hammer toe(s) (acquired), left foot: Secondary | ICD-10-CM | POA: Diagnosis not present

## 2015-12-06 ENCOUNTER — Encounter: Payer: Medicare Other | Admitting: Physical Therapy

## 2015-12-09 ENCOUNTER — Ambulatory Visit: Payer: Medicare Other | Admitting: *Deleted

## 2015-12-09 DIAGNOSIS — M25561 Pain in right knee: Secondary | ICD-10-CM | POA: Diagnosis not present

## 2015-12-09 NOTE — Therapy (Signed)
[facility] [place], [place], [address] Phone: [phone]   Fax:  [phone]  Physical Therapy Treatment  Patient Details  Name: Craig Hurley MRN: [id number] Date of Birth: January 02, 1946 Referring Provider: Rod Can MD.  Encounter Date: 12/09/2015      PT End of Session - 12/09/15 1035    Visit Number 3   Number of Visits 12   Date for PT Re-Evaluation 01/04/16   PT Start Time 1030   PT Stop Time 1119   PT Time Calculation (min) 49 min      Past Medical History  Diagnosis Date  . Asthma     Past Surgical History  Procedure Laterality Date  . Fracture surgery Right   . Shoulder arthroscopy Right   . Back surgery  2005    lumbar  . Vasectomy      There were no vitals filed for this visit.  Visit Diagnosis:  Right knee pain      Subjective Assessment - 12/09/15 1033    Subjective Myt kne is sore to touch but is better. Did good after last Rx   Patient Stated Goals Get out of pain.   Currently in Pain? Yes   Pain Score 2    Pain Location Knee   Pain Orientation Right   Pain Descriptors / Indicators Aching   Pain Type Chronic pain   Pain Onset More than a month ago   Pain Frequency Constant   Aggravating Factors  prolonged driving   Pain Relieving Factors Rest, PT Rxs                         [facility] Adult PT Treatment/Exercise - 12/09/15 0001    Exercises   Exercises Knee/Hip   Knee/Hip Exercises: Aerobic   Nustep Level 4 x 15 minutes.   Modalities   Modalities Dispensing optician Stimulation Location Right distal lateral knee.premod x 15 mins 80-_0    Ultrasound   Ultrasound Location RT knee lateral  aspect   Ultrasound Parameters 1.5 w/cm2 x 10 mins   Ultrasound Goals Pain   Vasopneumatic   Number Minutes Vasopneumatic  15 minutes   Vasopnuematic Location  Knee   Vasopneumatic Pressure Medium   Vasopneumatic Temperature  36                  PT Short Term Goals - 12/09/15 1112    PT SHORT TERM GOAL #1   Title Ind with a HEP.   Time 2   Period Weeks   Status On-going           PT Long Term Goals - 12/09/15 1112    PT LONG TERM GOAL #1   Title Perform ADL's with pain not > 3/10.   Time 4   Period Weeks   Status Achieved   PT LONG TERM GOAL #2   Title Go up and down a flight of stairs with pain not > 3/10.   Period Weeks   Status Achieved   PT LONG TERM GOAL #3   Title Drive 45 minutes with right knee pain-level not > 3-4/10.   Time 4   Period Weeks   Status On-going               Plan - 12/09/15 1102    Clinical Impression Statement Pt did fairly well again with Rx and was less tender around Lateral aspect RT knee. Today's  pain level is the lowest it has been in a week. He feels he has Met 2 out of three LTGs today. Re-assess next week.   Pt will benefit from skilled therapeutic intervention in order to improve on the following deficits Pain;Decreased activity tolerance   Rehab Potential Excellent   PT Frequency 3x / week   PT Duration 4 weeks   PT Treatment/Interventions ADLs/Self Care Home Management;Electrical Stimulation;Cryotherapy;Moist Heat;Ultrasound;Therapeutic activities;Therapeutic exercise;Manual techniques;Patient/family education   PT Next Visit Plan IASTM/STW/M to right lateral distal knee f/b Combo e'stim/U/S and pain-free right quadriceps strengthening.   Consulted and Agree with Plan of Care Patient        Problem List Patient Active Problem List   Diagnosis Date Noted  . COPD (chronic obstructive pulmonary disease) with emphysema ([place]) 11/08/2015  . BPH (benign prostatic hyperplasia) 10/25/2015  . GERD (gastroesophageal reflux disease) 10/25/2015    RAMSEUR,CHRIS, PTA 12/09/2015, 11:19 AM  [facility] [place], [place], [address] Phone: [phone]   Fax:   [phone]  Name: Sammy Cassar MRN: [id number] Date of Birth: 05-01-1946

## 2015-12-14 ENCOUNTER — Ambulatory Visit: Payer: Medicare Other | Admitting: *Deleted

## 2015-12-14 DIAGNOSIS — M25561 Pain in right knee: Secondary | ICD-10-CM | POA: Diagnosis not present

## 2015-12-14 NOTE — Therapy (Signed)
[facility] [place], [place], [address] Phone: [phone]   Fax:  [phone]  Physical Therapy Treatment  Patient Details  Name: Craig Hurley MRN: [id number] Date of Birth: September 20, 1946 Referring Provider: Rod Can MD.  Encounter Date: 12/14/2015      PT End of Session - 12/14/15 1204    Visit Number 4   Number of Visits 12   Date for PT Re-Evaluation 01/04/16   PT Start Time 0945   PT Stop Time 1031   PT Time Calculation (min) 46 min      Past Medical History  Diagnosis Date  . Asthma     Past Surgical History  Procedure Laterality Date  . Fracture surgery Right   . Shoulder arthroscopy Right   . Back surgery  2005    lumbar  . Vasectomy      There were no vitals filed for this visit.  Visit Diagnosis:  Right knee pain      Subjective Assessment - 12/14/15 1209    Subjective RT knee was sore for a few days after last Rx Doing good now and am ready to DC   Pain Score 1    Pain Location Knee   Pain Orientation Right   Pain Descriptors / Indicators Aching   Pain Type Chronic pain   Pain Onset More than a month ago   Pain Frequency Intermittent   Aggravating Factors  Prolonged driving   Pain Relieving Factors PT Rxs                         [facility] Adult PT Treatment/Exercise - 12/14/15 0001    Exercises   Exercises Knee/Hip   Modalities   Modalities Electrical Stimulation;Ultrasound;Vasopneumatic   Electrical Stimulation   Electrical Stimulation Location Right distal lateral knee.premod x 15 mins 80-_0    Electrical Stimulation Goals Pain   Ultrasound   Ultrasound Location RT knee    Ultrasound Parameters 1.5 w/cm2 x 8 mins   Ultrasound Goals Pain   Vasopneumatic   Number Minutes Vasopneumatic  15 minutes   Vasopnuematic Location  Knee   Vasopneumatic Pressure Low   Vasopneumatic Temperature  36                  PT Short Term Goals - 12/14/15 1030     PT SHORT TERM GOAL #1   Title Ind with a HEP.   Period Weeks   Status Achieved           PT Long Term Goals - 12/14/15 1030    PT LONG TERM GOAL #1   Title Perform ADL's with pain not > 3/10.   Time 4   Period Weeks   Status Achieved   PT LONG TERM GOAL #2   Title Go up and down a flight of stairs with pain not > 3/10.   Period Weeks   Status Achieved   PT LONG TERM GOAL #3   Title Drive 45 minutes with right knee pain-level not > 3-4/10.   Time 4   Period Weeks   Status Achieved               Plan - 12/14/15 1207    Clinical Impression Statement Pt has met all goals and is ready to DC from PT   Pt will benefit from skilled therapeutic intervention in order to improve on the following deficits Pain;Decreased activity tolerance   Rehab Potential Excellent   PT  Frequency 3x / week   PT Duration 4 weeks   PT Treatment/Interventions ADLs/Self Care Home Management;Electrical Stimulation;Cryotherapy;Moist Heat;Ultrasound;Therapeutic activities;Therapeutic exercise;Manual techniques;Patient/family education   PT Next Visit Plan DC to home program   Consulted and Agree with Plan of Care Patient          G-Codes - 12-24-2015 January 19, 1210    Functional Assessment Tool Used  DC FOTO 39% limited and DC Gcode       Problem List Patient Active Problem List   Diagnosis Date Noted  . COPD (chronic obstructive pulmonary disease) with emphysema ([place]) 11/08/2015  . BPH (benign prostatic hyperplasia) 10/25/2015  . GERD (gastroesophageal reflux disease) 10/25/2015    Craig Hurley,Craig Hurley, Craig Hurley 12/24/2015, 12:13 PM  [facility] [place], [place], [address] Phone: [phone]   Fax:  [phone]  Name: Craig Hurley MRN: [id number] Date of Birth: Feb 06, 1946

## 2015-12-15 NOTE — Therapy (Signed)
[facility] [place], [place], [address] Phone: [phone]   Fax:  [phone]  Physical Therapy Treatment  Patient Details  Name: Craig Hurley MRN: [id number] Date of Birth: [birth year] Referring Provider: Rod Can MD.  Encounter Date: 12/14/2015      PT End of Session - 12/14/15 1204    Visit Number 4   Number of Visits 12   Date for PT Re-Evaluation 01/04/16   PT Start Time 0945   PT Stop Time 1031   PT Time Calculation (min) 46 min      Past Medical History  Diagnosis Date  . Asthma     Past Surgical History  Procedure Laterality Date  . Fracture surgery Right   . Shoulder arthroscopy Right   . Back surgery  2005    lumbar  . Vasectomy      There were no vitals filed for this visit.  Visit Diagnosis:  Right knee pain      Subjective Assessment - 12/14/15 1209    Subjective RT knee was sore for a few days after last Rx Doing good now and am ready to DC   Pain Score 1    Pain Location Knee   Pain Orientation Right   Pain Descriptors / Indicators Aching   Pain Type Chronic pain   Pain Onset More than a month ago   Pain Frequency Intermittent   Aggravating Factors  Prolonged driving   Pain Relieving Factors PT Rxs                                   PT Short Term Goals - 12/14/15 1030    PT SHORT TERM GOAL #1   Title Ind with a HEP.   Period Weeks   Status Achieved           PT Long Term Goals - 12/14/15 1030    PT LONG TERM GOAL #1   Title Perform ADL's with pain not > 3/10.   Time 4   Period Weeks   Status Achieved   PT LONG TERM GOAL #2   Title Go up and down a flight of stairs with pain not > 3/10.   Period Weeks   Status Achieved   PT LONG TERM GOAL #3   Title Drive 45 minutes with right knee pain-level not > 3-4/10.   Time 4   Period Weeks   Status Achieved               Plan - 12/14/15 1207    Clinical Impression Statement  Pt has met all goals and is ready to DC from PT   Pt will benefit from skilled therapeutic intervention in order to improve on the following deficits Pain;Decreased activity tolerance   Rehab Potential Excellent   PT Frequency 3x / week   PT Duration 4 weeks   PT Treatment/Interventions ADLs/Self Care Home Management;Electrical Stimulation;Cryotherapy;Moist Heat;Ultrasound;Therapeutic activities;Therapeutic exercise;Manual techniques;Patient/family education   PT Next Visit Plan DC to home program   Consulted and Agree with Plan of Care Patient          G-Codes - 01-14-2016 1336    Functional Assessment Tool Used  DC FOTO 39% limited and DC Gcode    Functional Limitation Mobility: Walking and moving around   Mobility: Walking and Moving Around Current Status ([id number]) At least 20 percent but less than 40 percent impaired, limited or restricted  Mobility: Walking and Moving Around Goal Status [phone]) At least 1 percent but less than 20 percent impaired, limited or restricted   Mobility: Walking and Moving Around Discharge Status [phone]) At least 20 percent but less than 40 percent impaired, limited or restricted      Problem List Patient Active Problem List   Diagnosis Date Noted  . COPD (chronic obstructive pulmonary disease) with emphysema ([place]) 11/08/2015  . BPH (benign prostatic hyperplasia) 10/25/2015  . GERD (gastroesophageal reflux disease) 10/25/2015   PHYSICAL THERAPY DISCHARGE SUMMARY  Visits from Start of Care: 4  Current functional level related to goals / functional outcomes: Please see above.   Remaining deficits: Al goals met.   Education / Equipment: HEP.  Plan: Patient agrees to discharge.  Patient goals were met. Patient is being discharged due to meeting the stated rehab goals.  ?????      Masson Nalepa, [place] MPT 12/15/2015, 1:37 PM  [facility] [address] [place], [place], [address] Phone: [phone]   Fax:   [phone]  Name: Craig Hurley MRN: [id number] Date of Birth: Aug 04, 1946

## 2016-01-18 ENCOUNTER — Telehealth: Payer: Self-pay | Admitting: Family Medicine

## 2016-01-28 DIAGNOSIS — Z0289 Encounter for other administrative examinations: Secondary | ICD-10-CM

## 2016-02-10 ENCOUNTER — Telehealth: Payer: Self-pay | Admitting: Family Medicine

## 2016-02-14 NOTE — Telephone Encounter (Signed)
Ready, pt aware 

## 2016-02-15 MED ORDER — MELOXICAM 15 MG PO TABS: 15.0000 mg | ORAL_TABLET | Freq: Every day | ORAL | Status: DC

## 2016-05-02 ENCOUNTER — Other Ambulatory Visit: Payer: Self-pay | Admitting: Family Medicine

## 2016-05-06 ENCOUNTER — Encounter: Payer: Self-pay | Admitting: Family Medicine

## 2016-05-08 ENCOUNTER — Ambulatory Visit (INDEPENDENT_AMBULATORY_CARE_PROVIDER_SITE_OTHER): Payer: Medicare Other | Admitting: Family Medicine

## 2016-05-08 ENCOUNTER — Encounter: Payer: Self-pay | Admitting: Family Medicine

## 2016-05-08 VITALS — BP 110/76 | HR 74 | Temp 98.2°F | Ht 70.0 in | Wt 235.6 lb

## 2016-05-08 DIAGNOSIS — K219 Gastro-esophageal reflux disease without esophagitis: Secondary | ICD-10-CM | POA: Diagnosis not present

## 2016-05-08 DIAGNOSIS — J439 Emphysema, unspecified: Secondary | ICD-10-CM | POA: Diagnosis not present

## 2016-05-08 DIAGNOSIS — M129 Arthropathy, unspecified: Secondary | ICD-10-CM

## 2016-05-08 DIAGNOSIS — M19012 Primary osteoarthritis, left shoulder: Secondary | ICD-10-CM

## 2016-05-08 MED ORDER — DOXAZOSIN MESYLATE 4 MG PO TABS: 4.0000 mg | ORAL_TABLET | Freq: Every day | ORAL | Status: DC

## 2016-05-08 MED ORDER — OMEPRAZOLE 20 MG PO CPDR: 20.0000 mg | DELAYED_RELEASE_CAPSULE | Freq: Every day | ORAL | Status: DC

## 2016-05-08 MED ORDER — MELOXICAM 15 MG PO TABS: 15.0000 mg | ORAL_TABLET | Freq: Every day | ORAL | Status: DC

## 2016-05-08 MED ORDER — CETIRIZINE HCL 10 MG PO TABS: 10.0000 mg | ORAL_TABLET | Freq: Every day | ORAL | Status: AC

## 2016-05-08 MED ORDER — MONTELUKAST SODIUM 10 MG PO TABS: 10.0000 mg | ORAL_TABLET | Freq: Every day | ORAL | Status: DC

## 2016-05-08 MED ORDER — FLUTICASONE-SALMETEROL 250-50 MCG/DOSE IN AEPB: 1.0000 | INHALATION_SPRAY | Freq: Two times a day (BID) | RESPIRATORY_TRACT | Status: DC

## 2016-05-08 MED ORDER — ALBUTEROL SULFATE HFA 108 (90 BASE) MCG/ACT IN AERS: 2.0000 | INHALATION_SPRAY | RESPIRATORY_TRACT | Status: DC | PRN

## 2016-05-08 NOTE — Progress Notes (Signed)
BP 110/76 mmHg  Pulse 74  Temp(Src) 98.2 F (36.8 C) (Oral)  Ht 5\' 10"  (1.778 m)  Wt 235 lb 9.6 oz (106.867 kg)  BMI 33.80 kg/m2   Subjective:    [name], male    DOB: 1946/02/06, 70 y.o.   MRN: [id number]  HPI: Craig Hurley is a 70 y.o. male presenting on 05/08/2016 for Medication refills   HPI COPD recheck Patient is coming in today for a COPD recheck. He is currently on Advair 1 puff 2 times daily and has a rescue inhaler. He says he is only having his rescue inhaler about once every 2 months. He feels like he is doing very well and only has an occasional cough or wheeze. He denies any fevers or chills or shortness of breath. He denies any nighttime symptoms and has had recurrently.  GERD recheck Patient is currently taking omeprazole and it's doing well on it and denies any major symptoms. He is even taking an anti-inflammatory and is not having any issues with that with his stomach. He denies any blood in the stool  Left shoulder arthritis Patient is taking meloxicam for his left shoulder arthritis and it does help most of time when he takes it. Sometimes it builds up to the point where it is causing him some more issues and is feeling a lot of stiffness. He knows that he had a lot of arthritis in his right shoulder had a surgery on it and does not want to go forward with doing the surgery for the left shoulder.  Relevant past medical, surgical, family and social history reviewed and updated as indicated. Interim medical history since our last visit reviewed. Allergies and medications reviewed and updated.  Review of Systems  Constitutional: Negative for fever.  HENT: Negative for ear discharge and ear pain.   Eyes: Negative for discharge and visual disturbance.  Respiratory: Positive for cough and wheezing. Negative for shortness of breath.   Cardiovascular: Negative for chest pain and leg swelling.  Gastrointestinal: Negative for abdominal pain,  diarrhea and constipation.  Genitourinary: Negative for difficulty urinating.  Musculoskeletal: Positive for arthralgias. Negative for back pain and gait problem.  Skin: Negative for rash.  Neurological: Negative for syncope, light-headedness and headaches.  All other systems reviewed and are negative.   Per HPI unless specifically indicated above     Medication List       This list is accurate as of: 05/08/16  3:15 PM.  Always use your most recent med list.               acetaminophen 325 MG tablet  Commonly known as:  TYLENOL  Take 650 mg by mouth every 6 (six) hours as needed.     albuterol 108 (90 Base) MCG/ACT inhaler  Commonly known as:  PROVENTIL HFA;VENTOLIN HFA  Inhale 2 puffs into the lungs every 4 (four) hours as needed for wheezing or shortness of breath.     cetirizine 10 MG tablet  Commonly known as:  ZYRTEC  Take 1 tablet (10 mg total) by mouth daily.     doxazosin 4 MG tablet  Commonly known as:  CARDURA  Take 1 tablet (4 mg total) by mouth daily.     EYE VITAMINS PO  Take by mouth.     Fluticasone-Salmeterol 250-50 MCG/DOSE Aepb  Commonly known as:  ADVAIR  Inhale 1 puff into the lungs 2 (two) times daily.     meloxicam 15 MG tablet  Commonly  known as:  MOBIC  Take 1 tablet (15 mg total) by mouth daily. Needs to be seen for further refills     montelukast 10 MG tablet  Commonly known as:  SINGULAIR  Take 1 tablet (10 mg total) by mouth at bedtime.     omeprazole 20 MG capsule  Commonly known as:  PRILOSEC  Take 1 capsule (20 mg total) by mouth daily.     vitamin A 8000 UNIT capsule  Take 8,000 Units by mouth daily.           Objective:    BP 110/76 mmHg  Pulse 74  Temp(Src) 98.2 F (36.8 C) (Oral)  Ht 5\' 10"  (1.778 m)  Wt 235 lb 9.6 oz (106.867 kg)  BMI 33.80 kg/m2  Wt Readings from Last 3 Encounters:  05/08/16 235 lb 9.6 oz (106.867 kg)  11/08/15 244 lb 6.4 oz (110.859 kg)  10/25/15 252 lb 6.4 oz (114.488 kg)    Physical  Exam  Constitutional: He is oriented to person, place, and time. He appears well-developed and well-nourished. No distress.  Eyes: Conjunctivae and EOM are normal. Pupils are equal, round, and reactive to light. Right eye exhibits no discharge. No scleral icterus.  Neck: Neck supple. No thyromegaly present.  Cardiovascular: Normal rate, regular rhythm, normal heart sounds and intact distal pulses.   No murmur heard. Pulmonary/Chest: Effort normal and breath sounds normal. No respiratory distress. He has no wheezes. He has no rales.  Abdominal: Soft. Bowel sounds are normal. He exhibits no distension. There is no tenderness. There is no rebound and no guarding.  Musculoskeletal: Normal range of motion. He exhibits tenderness (left shoulder pain). He exhibits no edema.  Lymphadenopathy:    He has no cervical adenopathy.  Neurological: He is alert and oriented to person, place, and time. Coordination normal.  Skin: Skin is warm and dry. No rash noted. He is not diaphoretic.  Psychiatric: He has a normal mood and affect. His behavior is normal.  Nursing note and vitals reviewed.     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   Relevant Medications   montelukast (SINGULAIR) 10 MG tablet   Fluticasone-Salmeterol (ADVAIR) 250-50 MCG/DOSE AEPB   cetirizine (ZYRTEC) 10 MG tablet   albuterol (PROVENTIL HFA;VENTOLIN HFA) 108 (90 Base) MCG/ACT inhaler     Digestive   GERD (gastroesophageal reflux disease)   Relevant Medications   omeprazole (PRILOSEC) 20 MG capsule    Other Visit Diagnoses    Arthritis of left shoulder region        Relevant Medications    meloxicam (MOBIC) 15 MG tablet        Follow up plan: Return in about 6 months (around 11/07/2016), or if symptoms worsen or fail to improve, for Recheck COPD and GERD.  Counseling provided for all of the vaccine  components No orders of the defined types were placed in this encounter.    Caryl Pina, MD [facility] 05/08/2016, 3:15 PM

## 2016-07-19 DIAGNOSIS — H2513 Age-related nuclear cataract, bilateral: Secondary | ICD-10-CM | POA: Diagnosis not present

## 2016-07-19 DIAGNOSIS — H353131 Nonexudative age-related macular degeneration, bilateral, early dry stage: Secondary | ICD-10-CM | POA: Diagnosis not present

## 2016-07-19 DIAGNOSIS — H52223 Regular astigmatism, bilateral: Secondary | ICD-10-CM | POA: Diagnosis not present

## 2016-07-19 DIAGNOSIS — H5203 Hypermetropia, bilateral: Secondary | ICD-10-CM | POA: Diagnosis not present

## 2016-10-25 ENCOUNTER — Encounter: Payer: Self-pay | Admitting: Family Medicine

## 2016-10-25 ENCOUNTER — Ambulatory Visit (INDEPENDENT_AMBULATORY_CARE_PROVIDER_SITE_OTHER): Payer: Medicare Other

## 2016-10-25 ENCOUNTER — Ambulatory Visit (INDEPENDENT_AMBULATORY_CARE_PROVIDER_SITE_OTHER): Payer: Medicare Other | Admitting: Family Medicine

## 2016-10-25 VITALS — BP 125/80 | HR 86 | Temp 97.0°F | Ht 70.0 in | Wt 249.4 lb

## 2016-10-25 DIAGNOSIS — M25561 Pain in right knee: Secondary | ICD-10-CM

## 2016-10-25 DIAGNOSIS — E6609 Other obesity due to excess calories: Secondary | ICD-10-CM | POA: Diagnosis not present

## 2016-10-25 DIAGNOSIS — Z131 Encounter for screening for diabetes mellitus: Secondary | ICD-10-CM

## 2016-10-25 DIAGNOSIS — K219 Gastro-esophageal reflux disease without esophagitis: Secondary | ICD-10-CM

## 2016-10-25 DIAGNOSIS — Z6835 Body mass index (BMI) 35.0-35.9, adult: Secondary | ICD-10-CM

## 2016-10-25 DIAGNOSIS — J439 Emphysema, unspecified: Secondary | ICD-10-CM | POA: Diagnosis not present

## 2016-10-25 DIAGNOSIS — Z23 Encounter for immunization: Secondary | ICD-10-CM | POA: Diagnosis not present

## 2016-10-25 DIAGNOSIS — N401 Enlarged prostate with lower urinary tract symptoms: Secondary | ICD-10-CM

## 2016-10-25 DIAGNOSIS — R350 Frequency of micturition: Secondary | ICD-10-CM | POA: Diagnosis not present

## 2016-10-25 DIAGNOSIS — E669 Obesity, unspecified: Secondary | ICD-10-CM | POA: Insufficient documentation

## 2016-10-25 IMAGING — DX DG KNEE 1-2V*R*
2 series · 2 of 2 positions shown · non-contrast
Comparison: None.

CLINICAL DATA: Acute RIGHT knee pain for 2 weeks worse going down
stairs and twisting, no known injury question osteoarthritis

EXAM:
RIGHT KNEE - 1-2 VIEW

[knee ap]
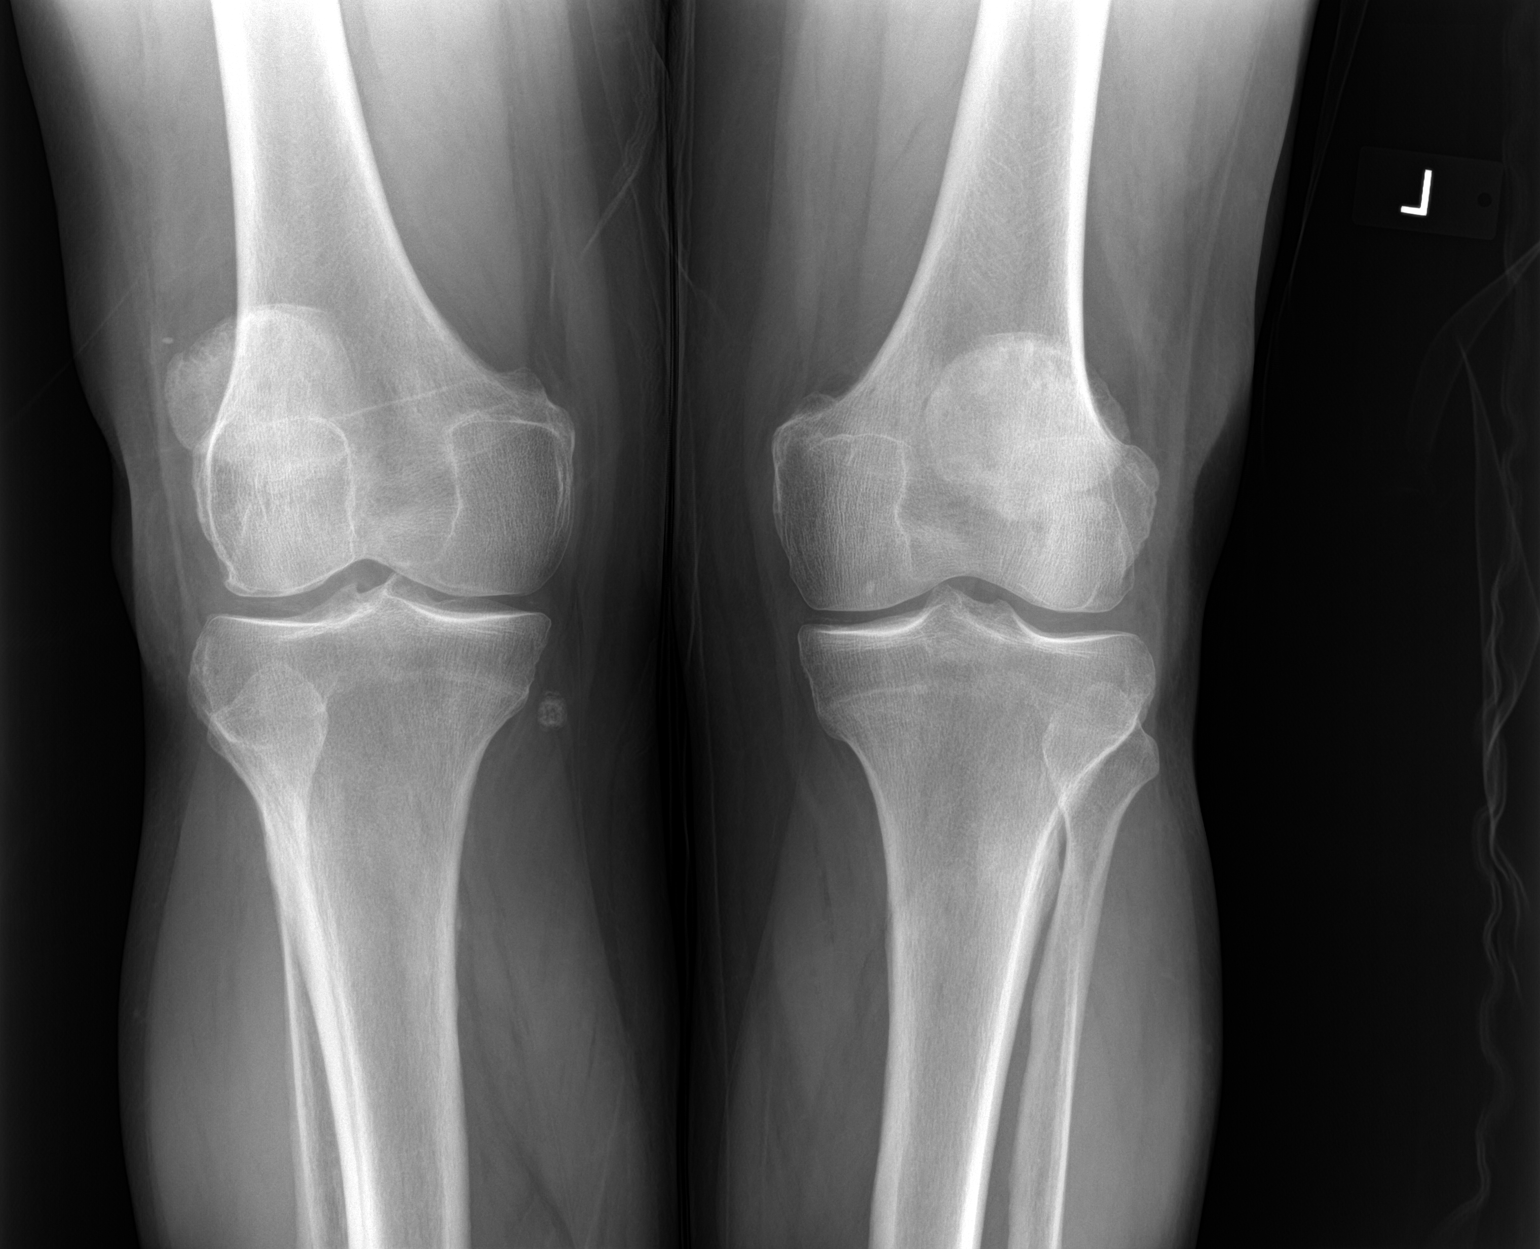

[knee lat]
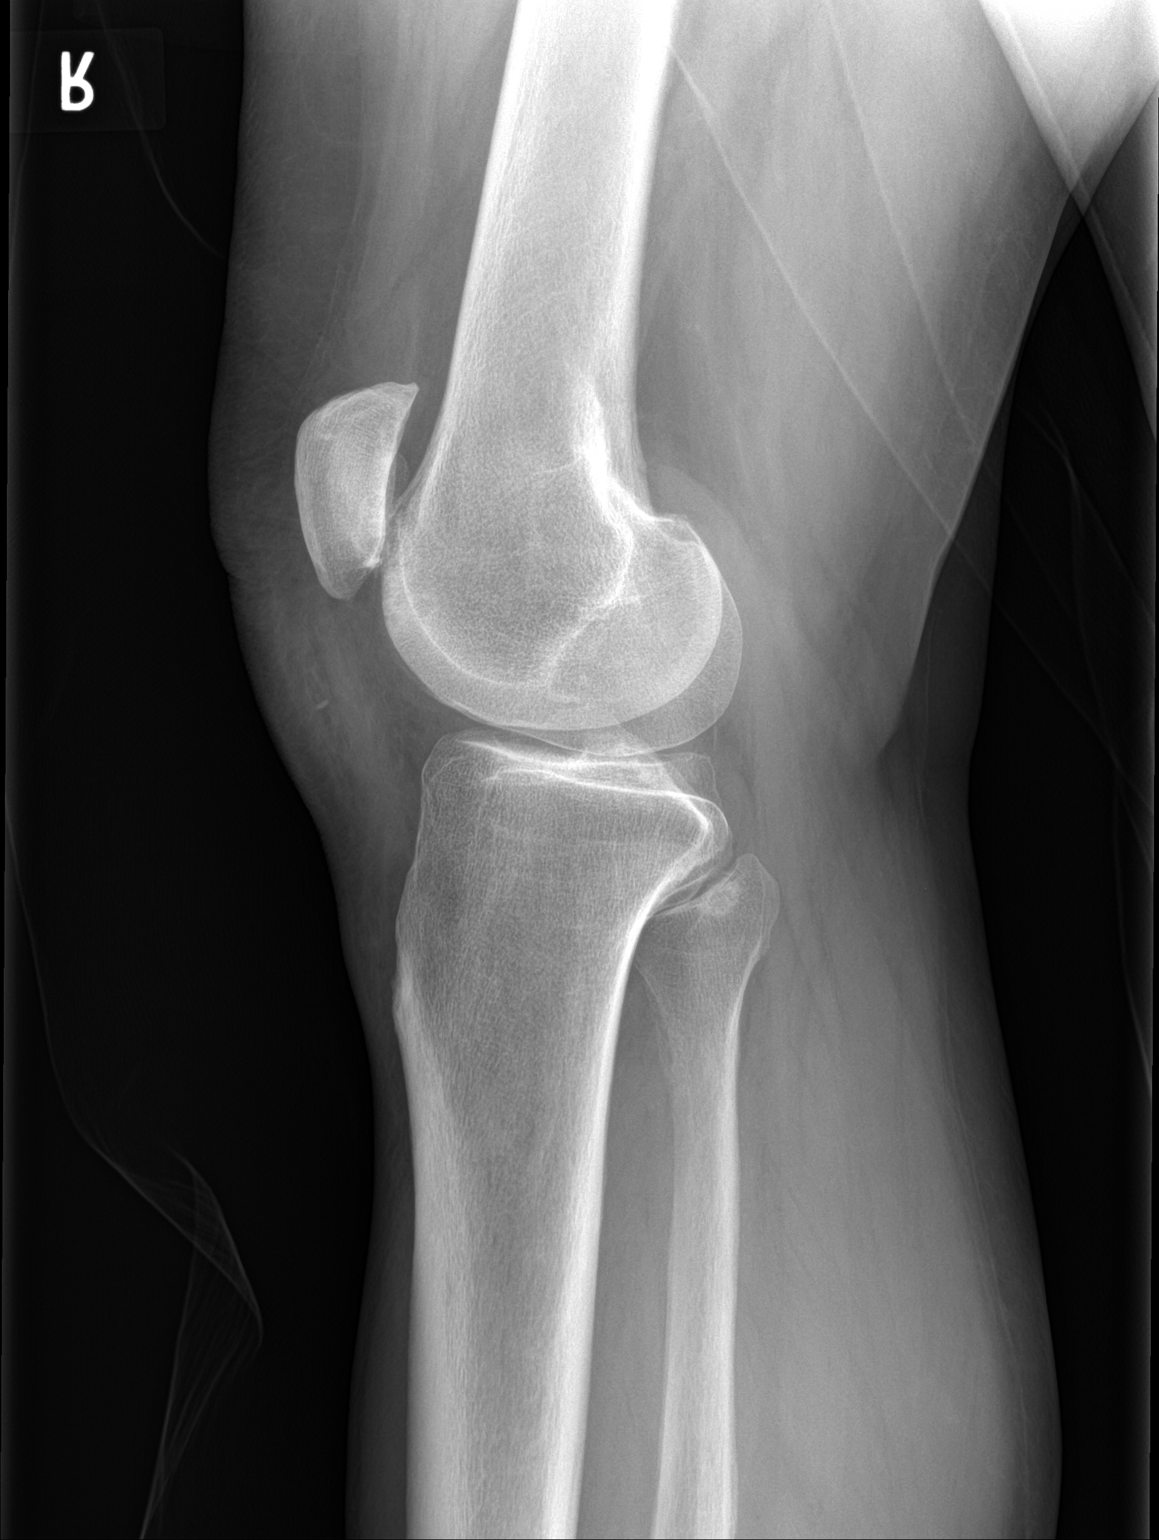

[2 of 2 positions shown; findings below may reference images not displayed]

FINDINGS: Mild osseous demineralization.

Minimal scattered joint space narrowing.

Mild chondrocalcinosis question CPPD/ pseudogout.

No acute fracture, dislocation, or bone destruction.

Anterior soft tissue swelling at the infrapatellar region of RIGHT
knee.

No knee joint effusion.

Small soft tissue calcification posterior medially at the level of
the proximal tibial metaphysis.

Comparison AP standing view of the LEFT knee demonstrates mild joint
space narrowing and chondrocalcinosis as well.
IMPRESSION: Osseous demineralization with mild degenerative changes and question
CPPD/pseudogout.

RIGHT infrapatellar soft tissue swelling anteriorly.

No acute osseous abnormalities.

## 2016-10-25 NOTE — Progress Notes (Signed)
BP 125/80   Pulse 86   Temp 97 F (36.1 C) (Oral)   Ht _0  (1.778 m)   Wt 249 lb 6 oz (113.1 kg)   BMI 35.78 kg/m    Subjective:    [name], male    DOB: [birth year], 70 y.o.   MRN: [id number]  HPI: Craig Hurley is a 70 y.o. male presenting on 10/25/2016 for COPD (6 month followup); Gastroesophageal Reflux; and Right knee pain (began 2 weeks ago, no known injury)   HPI Right knee pain Patient comes in Complaining of right knee pain is been going on for the past 2 weeks. He says the pain is mostly on the medial side of his knee is worse with specific movements, specifically going up or down stairs or twisting on that knee causes him a lot more issues. He feels sharp twinge when he has that and sometimes feels like the knee is going to give way on him. He denies any popping or catching of the knee. He denies any erythema or warmth or fevers or chills. He is currently taking meloxicam and has continued to take it when he has his knee pain. He says it may be getting slightly better over the past couple weeks but he cannot help.  COPD recheck Patient is coming in for a COPD recheck. He is currently using his albuterol inhaler about once every 2 or 3 weeks and not needing it more frequently. He says his breathing is been doing a lot better. He denies any shortness of breath or wheezing. He denies any nighttime symptoms.  GERD recheck Patient says his acid reflux is controlled on omeprazole and denies any symptoms from it. He is very happy with the medication.  Relevant past medical, surgical, family and social history reviewed and updated as indicated. Interim medical history since our last visit reviewed. Allergies and medications reviewed and updated.  Review of Systems  Constitutional: Negative for chills and fever.  Respiratory: Negative for shortness of breath and wheezing.   Cardiovascular: Negative for chest pain and leg swelling.  Gastrointestinal:  Negative for abdominal pain, blood in stool, constipation, diarrhea, nausea and vomiting.  Genitourinary: Positive for frequency.  Musculoskeletal: Positive for arthralgias. Negative for back pain, gait problem and joint swelling.  Skin: Negative for color change and rash.  Neurological: Negative for dizziness, weakness, light-headedness, numbness and headaches.  All other systems reviewed and are negative.   Per HPI unless specifically indicated above      Objective:    BP 125/80   Pulse 86   Temp 97 F (36.1 C) (Oral)   Ht _1  (1.778 m)   Wt 249 lb 6 oz (113.1 kg)   BMI 35.78 kg/m   Wt Readings from Last 3 Encounters:  10/25/16 249 lb 6 oz (113.1 kg)  05/08/16 235 lb 9.6 oz (106.9 kg)  11/08/15 244 lb 6.4 oz (110.9 kg)    Physical Exam  Constitutional: He is oriented to person, place, and time. He appears well-developed and well-nourished. No distress.  Eyes: Conjunctivae are normal. Right eye exhibits no discharge. Left eye exhibits no discharge. No scleral icterus.  Cardiovascular: Normal rate, regular rhythm, normal heart sounds and intact distal pulses.   No murmur heard. Pulmonary/Chest: Effort normal and breath sounds normal. No respiratory distress. He has no wheezes.  Abdominal: Soft. Bowel sounds are normal. He exhibits no distension. There is no tenderness. There is no rebound.  Musculoskeletal: Normal range of motion. He  exhibits no edema.       Right knee: He exhibits normal range of motion, no swelling, no ecchymosis, no deformity, no erythema, normal alignment, no LCL laxity, no bony tenderness, normal meniscus and no MCL laxity. Tenderness found. Medial joint line (Significant more pain with full extension.) tenderness noted.  Neurological: He is alert and oriented to person, place, and time. Coordination normal.  Skin: Skin is warm and dry. No rash noted. He is not diaphoretic.  Psychiatric: He has a normal mood and affect. His behavior is normal.  Nursing  note and vitals reviewed.   X-ray: Mild medial compartment narrowing, await final read by radiologist     Assessment & Plan:   Problem List Items Addressed This Visit      Respiratory   COPD (chronic obstructive pulmonary disease) with emphysema ([place]) - Primary    Controlled, rarely has to use albuterol inhaler        Digestive   GERD (gastroesophageal reflux disease)   Relevant Orders   CMP14+EGFR   CBC with Differential/Platelet     Genitourinary   BPH (benign prostatic hyperplasia)    Infrequent symptoms, will check PSA      Relevant Orders   PSA, total and free     Other   Obesity   Relevant Orders   Lipid panel    Other Visit Diagnoses    Diabetes mellitus screening       Relevant Orders   CMP14+EGFR   Acute pain of right knee       Been going on for 2 weeks, worse with downstairs and twisting. Takes meloxicam. X-ray, patient to continue meloxicam and call back if worsens   Relevant Orders   DG Knee 1-2 Views Right   Encounter for immunization       Relevant Orders   Flu Vaccine QUAD 36+ mos IM (Completed)       Follow up plan: Return if symptoms worsen or fail to improve.  Counseling provided for all of the vaccine components Orders Placed This Encounter  Procedures  . DG Knee 1-2 Views Right  . CMP14+EGFR  . Lipid panel  . PSA, total and free  . CBC with Differential/Platelet    Caryl Pina, MD [facility] 10/25/2016, 10:41 AM

## 2016-10-25 NOTE — Assessment & Plan Note (Signed)
Controlled, rarely has to use albuterol inhaler

## 2016-10-25 NOTE — Assessment & Plan Note (Signed)
Infrequent symptoms, will check PSA

## 2016-10-26 LAB — CMP14+EGFR
A/G RATIO: 1.8 (ref 1.2–2.2)
ALBUMIN: 4.3 g/dL (ref 3.5–4.8)
ALT: 17 IU/L (ref 0–44)
AST: 13 IU/L (ref 0–40)
Alkaline Phosphatase: 70 IU/L (ref 39–117)
BUN / CREAT RATIO: 18 (ref 10–24)
BUN: 18 mg/dL (ref 8–27)
Bilirubin Total: 0.3 mg/dL (ref 0.0–1.2)
CALCIUM: 9 mg/dL (ref 8.6–10.2)
CO2: 23 mmol/L (ref 18–29)
Chloride: 101 mmol/L (ref 96–106)
Creatinine, Ser: 0.99 mg/dL (ref 0.76–1.27)
GFR, EST AFRICAN AMERICAN: 88 mL/min/{1.73_m2} (ref >59)
GFR, EST NON AFRICAN AMERICAN: 76 mL/min/{1.73_m2} (ref >59)
GLOBULIN, TOTAL: 2.4 g/dL (ref 1.5–4.5)
Glucose: 96 mg/dL (ref 65–99)
POTASSIUM: 4.5 mmol/L (ref 3.5–5.2)
SODIUM: 141 mmol/L (ref 134–144)
TOTAL PROTEIN: 6.7 g/dL (ref 6.0–8.5)

## 2016-10-26 LAB — CBC WITH DIFFERENTIAL/PLATELET
BASOS ABS: 0 10*3/uL (ref 0.0–0.2)
BASOS: 0 %
EOS (ABSOLUTE): 0.1 10*3/uL (ref 0.0–0.4)
Eos: 1 %
Hematocrit: 43.9 % (ref 37.5–51.0)
Hemoglobin: 15.5 g/dL (ref 12.6–17.7)
IMMATURE GRANS (ABS): 0 10*3/uL (ref 0.0–0.1)
IMMATURE GRANULOCYTES: 0 %
LYMPHS: 25 %
Lymphocytes Absolute: 1.7 10*3/uL (ref 0.7–3.1)
MCH: 28.4 pg (ref 26.6–33.0)
MCHC: 35.3 g/dL (ref 31.5–35.7)
MCV: 81 fL (ref 79–97)
MONOCYTES: 9 %
Monocytes Absolute: 0.6 10*3/uL (ref 0.1–0.9)
NEUTROS PCT: 65 %
Neutrophils Absolute: 4.3 10*3/uL (ref 1.4–7.0)
PLATELETS: 199 10*3/uL (ref 150–379)
RBC: 5.45 x10E6/uL (ref 4.14–5.80)
RDW: 14.6 % (ref 12.3–15.4)
WBC: 6.7 10*3/uL (ref 3.4–10.8)

## 2016-10-26 LAB — LIPID PANEL
CHOL/HDL RATIO: 3.2 ratio (ref 0.0–5.0)
Cholesterol, Total: 199 mg/dL (ref 100–199)
HDL: 62 mg/dL (ref >39)
LDL Calculated: 122 mg/dL — ABNORMAL HIGH (ref 0–99)
Triglycerides: 73 mg/dL (ref 0–149)
VLDL Cholesterol Cal: 15 mg/dL (ref 5–40)

## 2016-10-26 LAB — PSA, TOTAL AND FREE
PSA FREE: 0.5 ng/mL
PSA, Free Pct: 31.3 %
Prostate Specific Ag, Serum: 1.6 ng/mL (ref 0.0–4.0)

## 2017-01-28 ENCOUNTER — Other Ambulatory Visit: Payer: Self-pay | Admitting: Family Medicine

## 2017-01-28 DIAGNOSIS — M19012 Primary osteoarthritis, left shoulder: Secondary | ICD-10-CM

## 2017-02-08 ENCOUNTER — Encounter: Payer: Self-pay | Admitting: Family Medicine

## 2017-02-08 ENCOUNTER — Ambulatory Visit (INDEPENDENT_AMBULATORY_CARE_PROVIDER_SITE_OTHER): Payer: Medicare Other | Admitting: Family Medicine

## 2017-02-08 VITALS — BP 112/76 | HR 80 | Temp 97.5°F | Ht 70.0 in | Wt 253.0 lb

## 2017-02-08 DIAGNOSIS — L03011 Cellulitis of right finger: Secondary | ICD-10-CM | POA: Diagnosis not present

## 2017-02-08 DIAGNOSIS — L2481 Irritant contact dermatitis due to metals: Secondary | ICD-10-CM | POA: Diagnosis not present

## 2017-02-08 MED ORDER — SULFAMETHOXAZOLE-TRIMETHOPRIM 800-160 MG PO TABS: 1.0000 | ORAL_TABLET | Freq: Two times a day (BID) | ORAL | 0 refills | Status: DC

## 2017-02-08 MED ORDER — PREDNISONE 20 MG PO TABS: ORAL_TABLET | ORAL | 0 refills | Status: DC

## 2017-02-08 NOTE — Progress Notes (Signed)
BP 112/76   Pulse 80   Temp 97.5 F (36.4 C) (Oral)   Ht 5\' 10"  (1.778 m)   Wt 253 lb (114.8 kg)   BMI 36.30 kg/m    Subjective:    [name], male    DOB: [birth year], 71 y.o.   MRN: [id number]  HPI: Craig Hurley is a 71 y.o. male presenting on 02/08/2017 for Lesion on right hand (3rd and 5th digit) and Rash (x 1 month, began after using a polishing cloth on his ring, not sure if it came from that)   HPI Rash and redness around third fourth and fifth fingers on right hand. Patient comes in today because is been fighting a rash and redness and swelling on his third fourth and fifth fingers of his right hand for the past month and feels like it is worsening recently. He says that he did not have any problems with the ring that he was on the hand until he polished and ever since he polished it using attack cough and no chemicals he has been having this rash and reaction that is very itchy and is swollen starting from that ring finger and extending over to his pinky finger and his middle finger on that hand. He is also had some purulent discharge more recently over the past week out of 2 spots on the dorsum of his middle finger and fifth finger. He denies any fevers or chills. He denies any drainage today but says that it drained a couple days ago and he was able to express some from it. He denies any loss of range of motion or sensation.  Relevant past medical, surgical, family and social history reviewed and updated as indicated. Interim medical history since our last visit reviewed. Allergies and medications reviewed and updated.  Review of Systems  Constitutional: Negative for chills and fever.  Eyes: Negative for discharge.  Respiratory: Negative for shortness of breath and wheezing.   Cardiovascular: Negative for chest pain and leg swelling.  Musculoskeletal: Negative for back pain and gait problem.  Skin: Positive for color change, rash and wound.  All  other systems reviewed and are negative.   Per HPI unless specifically indicated above    Objective:    BP 112/76   Pulse 80   Temp 97.5 F (36.4 C) (Oral)   Ht 5\' 10"  (1.778 m)   Wt 253 lb (114.8 kg)   BMI 36.30 kg/m   Wt Readings from Last 3 Encounters:  02/08/17 253 lb (114.8 kg)  10/25/16 249 lb 6 oz (113.1 kg)  05/08/16 235 lb 9.6 oz (106.9 kg)    Physical Exam  Constitutional: He is oriented to person, place, and time. He appears well-developed and well-nourished. No distress.  Eyes: Conjunctivae are normal. No scleral icterus.  Cardiovascular: Normal rate, regular rhythm, normal heart sounds and intact distal pulses.   No murmur heard. Pulmonary/Chest: Effort normal and breath sounds normal. No respiratory distress. He has no wheezes. He has no rales.  Musculoskeletal: Normal range of motion. He exhibits no edema.  Neurological: He is alert and oriented to person, place, and time. Coordination normal.  Skin: Skin is warm and dry. Lesion and rash noted. He is not diaphoretic.  Patient has a rash that is scaly in nature extending over the dorsum of his third fourth and fifth fingers and overlying MCP joints. He also has it on the palmar side of his hand extending down slightly on to his palm as  well. On the dorsum side of his third and fifth fingers he has areas of induration with crusting overlying them. There is a small amount of warmth and redness surrounding those spots but the rest looks very irritated and excoriated.  Psychiatric: He has a normal mood and affect. His behavior is normal.  Nursing note and vitals reviewed.     Assessment & Plan:   Problem List Items Addressed This Visit    None    Visit Diagnoses    Irritant contact dermatitis due to metals    -  Primary   Relevant Medications   predniSONE (DELTASONE) 20 MG tablet   Cellulitis of finger of right hand       Relevant Medications   sulfamethoxazole-trimethoprim (BACTRIM DS,SEPTRA DS) 800-160 MG  tablet       Follow up plan: Return if symptoms worsen or fail to improve.  Counseling provided for all of the vaccine components No orders of the defined types were placed in this encounter.   Caryl Pina, MD [facility] 02/08/2017, 10:00 AM

## 2017-03-06 ENCOUNTER — Ambulatory Visit: Payer: Medicare Other

## 2017-04-24 ENCOUNTER — Encounter: Payer: Self-pay | Admitting: Family Medicine

## 2017-04-24 ENCOUNTER — Ambulatory Visit (INDEPENDENT_AMBULATORY_CARE_PROVIDER_SITE_OTHER): Payer: Medicare Other | Admitting: Family Medicine

## 2017-04-24 VITALS — BP 118/79 | HR 76 | Temp 97.2°F | Ht 70.0 in | Wt 254.0 lb

## 2017-04-24 DIAGNOSIS — J439 Emphysema, unspecified: Secondary | ICD-10-CM | POA: Diagnosis not present

## 2017-04-24 DIAGNOSIS — K219 Gastro-esophageal reflux disease without esophagitis: Secondary | ICD-10-CM

## 2017-04-24 MED ORDER — UMECLIDINIUM BROMIDE 62.5 MCG/INH IN AEPB: 1.0000 | INHALATION_SPRAY | Freq: Every day | RESPIRATORY_TRACT | 3 refills | Status: DC

## 2017-04-24 NOTE — Assessment & Plan Note (Signed)
Will add an anti-cholinergic because patient's Advair isn't quite making it through the day.

## 2017-04-24 NOTE — Progress Notes (Signed)
BP 118/79   Pulse 76   Temp 97.2 F (36.2 C) (Oral)   Ht 5\' 10"  (1.778 m)   Wt 254 lb (115.2 kg)   BMI 36.45 kg/m    Subjective:    [name], male    DOB: 01/28/1946, 71 y.o.   MRN: [id number]  HPI: Craig Hurley is a 71 y.o. male presenting on 04/24/2017 for COPD (followup; wife has noticed more wheezing recently) and Gastroesophageal Reflux   HPI COPD Patient has been having increased issues with the COPD and breathing issues. Wife says he gets really wheezy around the time that he is going to take his next Advair and then it improves about an hour later. This happens both in the morning and in the evening. They feel like it's just not lasting long enough for the dose is not enough. He is still taking his Singulair and is switched from Zyrtec to Claritin currently. He denies any fevers or chills or shortness of breath but mainly just getting wheezy and has had a little bit more coughing over the past month  GERD medication refill  Relevant past medical, surgical, family and social history reviewed and updated as indicated. Interim medical history since our last visit reviewed. Allergies and medications reviewed and updated.  Review of Systems  Constitutional: Negative for chills and fever.  HENT: Negative for congestion, ear discharge, ear pain, postnasal drip, rhinorrhea, sinus pressure, sneezing, sore throat and voice change.   Eyes: Negative for pain, discharge, redness and visual disturbance.  Respiratory: Positive for cough and wheezing. Negative for shortness of breath.   Cardiovascular: Negative for chest pain and leg swelling.  Musculoskeletal: Negative for gait problem.  Skin: Negative for rash.  All other systems reviewed and are negative.   Per HPI unless specifically indicated above     Objective:    BP 118/79   Pulse 76   Temp 97.2 F (36.2 C) (Oral)   Ht 5\' 10"  (1.778 m)   Wt 254 lb (115.2 kg)   BMI 36.45 kg/m   Wt Readings  from Last 3 Encounters:  04/24/17 254 lb (115.2 kg)  02/08/17 253 lb (114.8 kg)  10/25/16 249 lb 6 oz (113.1 kg)    Physical Exam  Constitutional: He is oriented to person, place, and time. He appears well-developed and well-nourished. No distress.  HENT:  Right Ear: External ear normal.  Left Ear: External ear normal.  Nose: Nose normal.  Mouth/Throat: Oropharynx is clear and moist. No oropharyngeal exudate.  Eyes: Conjunctivae are normal. No scleral icterus.  Neck: Neck supple. No thyromegaly present.  Cardiovascular: Normal rate, regular rhythm, normal heart sounds and intact distal pulses.   No murmur heard. Pulmonary/Chest: Effort normal and breath sounds normal. No respiratory distress. He has no wheezes (No wheezes on exam today). He has no rales.  Musculoskeletal: Normal range of motion. He exhibits no edema.  Lymphadenopathy:    He has no cervical adenopathy.  Neurological: He is alert and oriented to person, place, and time. Coordination normal.  Skin: Skin is warm and dry. No rash noted. He is not diaphoretic.  Psychiatric: He has a normal mood and affect. His behavior is normal.  Nursing note and vitals reviewed.      Assessment & Plan:   Problem List Items Addressed This Visit      Respiratory   COPD (chronic obstructive pulmonary disease) with emphysema ([place]) - Primary    Will add an anti-cholinergic because patient's Advair isn't  quite making it through the day.      Relevant Medications   umeclidinium bromide (INCRUSE ELLIPTA) 62.5 MCG/INH AEPB     Digestive   GERD (gastroesophageal reflux disease)       Follow up plan: Return in about 6 months (around 10/25/2017), or if symptoms worsen or fail to improve, for Recheck COPD.  Counseling provided for all of the vaccine components No orders of the defined types were placed in this encounter.   Caryl Pina, MD [facility] 04/24/2017, 10:42 AM

## 2017-05-26 ENCOUNTER — Other Ambulatory Visit: Payer: Self-pay | Admitting: Family Medicine

## 2017-05-26 DIAGNOSIS — J439 Emphysema, unspecified: Secondary | ICD-10-CM

## 2017-05-28 ENCOUNTER — Other Ambulatory Visit: Payer: Self-pay | Admitting: Family Medicine

## 2017-05-28 DIAGNOSIS — K219 Gastro-esophageal reflux disease without esophagitis: Secondary | ICD-10-CM

## 2017-05-28 DIAGNOSIS — J439 Emphysema, unspecified: Secondary | ICD-10-CM

## 2017-07-05 ENCOUNTER — Ambulatory Visit (INDEPENDENT_AMBULATORY_CARE_PROVIDER_SITE_OTHER): Payer: Medicare Other | Admitting: *Deleted

## 2017-07-05 VITALS — BP 102/70 | HR 67 | Ht 70.0 in | Wt 251.0 lb

## 2017-07-05 DIAGNOSIS — Z0000 Encounter for general adult medical examination without abnormal findings: Secondary | ICD-10-CM | POA: Diagnosis not present

## 2017-07-05 NOTE — Patient Instructions (Signed)
  Craig Hurley , Thank you for taking time to come for your Medicare Wellness Visit. I appreciate your ongoing commitment to your health goals. Please review the following plan we discussed and let me know if I can assist you in the future.   These are the goals we discussed: Goals    . Exercise 3x per week (30 min per time)    . Have 3 meals a day       This is a list of the screening recommended for you and due dates:  Health Maintenance  Topic Date Due  . Flu Shot  06/27/2017  . Tetanus Vaccine  01/07/2018  . Colon Cancer Screening  05/24/2025  .  Hepatitis C: One time screening is recommended by Center for Disease Control  (CDC) for  adults born from [birth year] through 1966.   Completed  . Pneumonia vaccines  Addressed

## 2017-07-06 NOTE — Progress Notes (Signed)
Subjective:   Sahib Pella is a 71 y.o. male who presents for an Initial Medicare Annual Wellness Visit.  Review of Systems  Kypton Eltringham is a 71 year old male who comes in today for his Initial Medicare Annual Wellness exam. Patient retired from driving a truck. He enjoys racing, fishing, and hunting. Patient states he does not exercise. Seated strengthening exercise handout given. Patient states his diet is semi-healthy. Patient is not involved in the community or church. He actually just moved to  2 years ago. He is from [place]. He lives in [place] with his wife of 27 years. They have 2 sons. He does have stairs in the home and 1 dog. Fall hazards were discussed with patient. Patient has not had any hospitalizations or surgeries in the last year.       Objective:    Today's Vitals   07/05/17 1655  BP: 102/70  Pulse: 67  Weight: 251 lb (113.9 kg)  Height: 5\' 10"  (1.778 m)   Body mass index is 36.01 kg/m.  Current Medications (verified) Outpatient Encounter Prescriptions as of 07/05/2017  Medication Sig  . acetaminophen (TYLENOL) 325 MG tablet Take 650 mg by mouth every 6 (six) hours as needed.  Marland Kitchen ADVAIR DISKUS 250-50 MCG/DOSE AEPB INHALE ONE DOSE BY MOUTH TWICE DAILY  . albuterol (PROVENTIL HFA;VENTOLIN HFA) 108 (90 Base) MCG/ACT inhaler Inhale 2 puffs into the lungs every 4 (four) hours as needed for wheezing or shortness of breath.  . cetirizine (ZYRTEC) 10 MG tablet Take 1 tablet (10 mg total) by mouth daily.  Marland Kitchen doxazosin (CARDURA) 4 MG tablet TAKE ONE TABLET BY MOUTH ONCE DAILY  . meloxicam (MOBIC) 15 MG tablet TAKE ONE TABLET BY MOUTH ONCE DAILY  . montelukast (SINGULAIR) 10 MG tablet TAKE ONE TABLET BY MOUTH AT BEDTIME  . Multiple Vitamins-Minerals (EYE VITAMINS PO) Take by mouth.  Marland Kitchen omeprazole (PRILOSEC) 20 MG capsule TAKE ONE CAPSULE BY MOUTH ONCE DAILY  . TURMERIC CURCUMIN PO Take 1 Dose by mouth 2 (two) times daily.  Marland Kitchen umeclidinium bromide (INCRUSE  ELLIPTA) 62.5 MCG/INH AEPB Inhale 1 puff into the lungs daily.  . vitamin A 8000 UNIT capsule Take 8,000 Units by mouth daily.   No facility-administered encounter medications on file as of 07/05/2017.     Allergies (verified) Penicillins   History: Past Medical History:  Diagnosis Date  . Arthritis   . Asthma    Past Surgical History:  Procedure Laterality Date  . BACK SURGERY  2005   lumbar  . FRACTURE SURGERY Right   . SHOULDER ARTHROSCOPY Right   . SKIN GRAFT FULL THICKNESS TRUNK    . VASECTOMY     Family History  Problem Relation Age of Onset  . Adopted: Yes  . Cancer Mother        colon cancer   Social History   Occupational History  . Not on file.   Social History Main Topics  . Smoking status: Former Smoker    Types: Cigars  . Smokeless tobacco: Current User    Types: Snuff  . Alcohol use No  . Drug use: No  . Sexual activity: Yes   Tobacco Counseling Ready to quit: Not Answered Counseling given: Not Answered Patient does not smoke.   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 N  Comment wears glasses  Difficulty concentrating or  making decisions? Y  Comment Patient states he gets mixed up pretty easy somedays   Walking or climbing stairs? N  Dressing or bathing? N  Doing errands, shopping? N  Some recent data might be hidden  Patient does wear hearing aids in both ears. Patient does wear glasses Patient and wife both state that he does get mixed up easy.    Immunizations and Health Maintenance Immunization History  Administered Date(s) Administered  . Influenza,inj,Quad PF,36+ Mos 10/25/2016   Health Maintenance Due  Topic Date Due  . INFLUENZA VACCINE  06/27/2017  Patient will have a flu vaccine when they become available  Patient Care Team: Dettinger, Fransisca Kaufmann, MD as PCP - General (Family  Medicine)  Indicate any recent Medical Services you may have received from other than [facility] providers in the past year (date may be approximate).    Assessment:   This is a routine wellness examination for [place].   Hearing/Vision screen No exam data present Patient does some hearing aid in both ears.  Patient does wear glasses Dietary issues and exercise activities discussed:    Goals    . Exercise 3x per week (30 min per time)    . Have 3 meals a day      Depression Screen PHQ 2/9 Scores 07/05/2017 04/24/2017 02/08/2017 10/25/2016  PHQ - 2 Score 0 0 0 0    Fall Risk Fall Risk  07/05/2017 04/24/2017 02/08/2017 10/25/2016 05/08/2016  Falls in the past year? No No No No No    Cognitive Function: MMSE - Mini Mental State Exam 07/05/2017  Orientation to time 4  Orientation to Place 5  Registration 3  Attention/ Calculation 5  Recall 2  Language- name 2 objects 2  Language- repeat 1  Language- follow 3 step command 3  Language- read & follow direction 1  Write a sentence 1  Copy design 1  Total score 28    Patient scored a 28 out of 30. Patient and wife both stated that he does have difficulty remembering things on occasion and he does get mixed up easily.     Screening Tests Health Maintenance  Topic Date Due  . INFLUENZA VACCINE  06/27/2017  . TETANUS/TDAP  01/07/2018  . COLONOSCOPY  05/24/2025  . Hepatitis C Screening  Completed  . PNA vac Low Risk Adult  Addressed   Patient will request records from physicians office in [place] so we can verify dates of Vaccines.     Plan:   Patient will obtain a Flu Vaccine when they become available. Patient will check on vaccines. Patient will follow up with Dr. Warrick Parisian as scheduled.   I have personally reviewed and noted the following in the patient's chart:   . Medical and social history . Use of alcohol, tobacco or illicit drugs  . Current medications and supplements . Functional ability and status . Nutritional  status . Physical activity . Advanced directives . List of other physicians . Hospitalizations, surgeries, and ER visits in previous 12 months . Vitals . Screenings to include cognitive, depression, and falls . Referrals and appointments  In addition, I have reviewed and discussed with patient certain preventive protocols, quality metrics, and best practice recommendations. A written personalized care plan for preventive services as well as general preventive health recommendations were provided to patient.     Gareth Morgan, LPN   03/12/3844

## 2017-07-20 DIAGNOSIS — H43393 Other vitreous opacities, bilateral: Secondary | ICD-10-CM | POA: Diagnosis not present

## 2017-07-20 DIAGNOSIS — H353131 Nonexudative age-related macular degeneration, bilateral, early dry stage: Secondary | ICD-10-CM | POA: Diagnosis not present

## 2017-07-20 DIAGNOSIS — H04123 Dry eye syndrome of bilateral lacrimal glands: Secondary | ICD-10-CM | POA: Diagnosis not present

## 2017-07-20 DIAGNOSIS — H2513 Age-related nuclear cataract, bilateral: Secondary | ICD-10-CM | POA: Diagnosis not present

## 2017-09-28 ENCOUNTER — Ambulatory Visit (INDEPENDENT_AMBULATORY_CARE_PROVIDER_SITE_OTHER): Payer: Medicare Other | Admitting: Family Medicine

## 2017-09-28 ENCOUNTER — Encounter: Payer: Self-pay | Admitting: Family Medicine

## 2017-09-28 VITALS — BP 121/68 | HR 96 | Temp 98.2°F | Ht 70.0 in | Wt 250.6 lb

## 2017-09-28 DIAGNOSIS — Z23 Encounter for immunization: Secondary | ICD-10-CM | POA: Diagnosis not present

## 2017-09-28 DIAGNOSIS — K5900 Constipation, unspecified: Secondary | ICD-10-CM | POA: Diagnosis not present

## 2017-09-28 DIAGNOSIS — K642 Third degree hemorrhoids: Secondary | ICD-10-CM | POA: Diagnosis not present

## 2017-09-28 MED ORDER — POLYETHYLENE GLYCOL 3350 17 GM/SCOOP PO POWD: 17.0000 g | Freq: Every day | ORAL | 1 refills | Status: DC | PRN

## 2017-09-28 MED ORDER — SODIUM POLYSTYRENE SULFONATE 50 GM/200ML RE SUSP: 50.0000 g | Freq: Once | RECTAL | 0 refills | Status: AC

## 2017-09-28 NOTE — Progress Notes (Signed)
BP 121/68   Pulse 96   Temp 98.2 F (36.8 C) (Oral)   Ht 5\' 10"  (1.778 m)   Wt 250 lb 9.6 oz (113.7 kg)   BMI 35.96 kg/m    Subjective:    [name], male    DOB: November 08, 1946, 71 y.o.   MRN: [id number]  HPI: Craig Hurley is a 71 y.o. male presenting on 09/28/2017 for Constipation and Hemorrhoids   HPI Constipation and hemorrhoids Patient has been having constipation this been bothering him over the past few months and has been gradually getting worse.  He says that he has been using an over-the-counter Dulcolax twice a day and it helps some but it is not clearing amount he feels like things are backing up still and now he has a very painful large what he feels like flap of skin down there that does bleed sometimes that is preventing him from wanting to have a bowel movement and making things worse.  He denies any abdominal pain but does have bloating.  He denies any fevers or chills or shortness of breath or wheezing.  He denies any blood in the stool  Relevant past medical, surgical, family and social history reviewed and updated as indicated. Interim medical history since our last visit reviewed. Allergies and medications reviewed and updated.  Review of Systems  Constitutional: Negative for chills and fever.  Respiratory: Negative for shortness of breath and wheezing.   Cardiovascular: Negative for chest pain and leg swelling.  Gastrointestinal: Positive for abdominal distention, anal bleeding and constipation. Negative for abdominal pain, blood in stool, diarrhea and nausea.  Musculoskeletal: Negative for back pain and gait problem.  Skin: Negative for rash.  All other systems reviewed and are negative.   Per HPI unless specifically indicated above   Allergies as of 09/28/2017      Reactions   Penicillins Swelling      Medication List       Accurate as of 09/28/17  2:52 PM. Always use your most recent med list.          acetaminophen 325 MG  tablet Commonly known as:  TYLENOL Take 650 mg by mouth every 6 (six) hours as needed.   ADVAIR DISKUS 250-50 MCG/DOSE Aepb Generic drug:  Fluticasone-Salmeterol INHALE ONE DOSE BY MOUTH TWICE DAILY   albuterol 108 (90 Base) MCG/ACT inhaler Commonly known as:  PROVENTIL HFA;VENTOLIN HFA Inhale 2 puffs into the lungs every 4 (four) hours as needed for wheezing or shortness of breath.   cetirizine 10 MG tablet Commonly known as:  ZYRTEC Take 1 tablet (10 mg total) by mouth daily.   doxazosin 4 MG tablet Commonly known as:  CARDURA TAKE ONE TABLET BY MOUTH ONCE DAILY   EYE VITAMINS PO Take by mouth.   meloxicam 15 MG tablet Commonly known as:  MOBIC TAKE ONE TABLET BY MOUTH ONCE DAILY   montelukast 10 MG tablet Commonly known as:  SINGULAIR TAKE ONE TABLET BY MOUTH AT BEDTIME   omeprazole 20 MG capsule Commonly known as:  PRILOSEC TAKE ONE CAPSULE BY MOUTH ONCE DAILY   polyethylene glycol powder powder Commonly known as:  GLYCOLAX/MIRALAX Take 17 g by mouth daily as needed.   sodium polystyrene sulfonate 50 GM/200ML enema Place 200 mLs (50 g total) rectally once.   TURMERIC CURCUMIN PO Take 1 Dose by mouth 2 (two) times daily.   umeclidinium bromide 62.5 MCG/INH Aepb Commonly known as:  INCRUSE ELLIPTA Inhale 1 puff into the lungs  daily.   vitamin A 8000 UNIT capsule Take 8,000 Units by mouth daily.          Objective:    BP 121/68   Pulse 96   Temp 98.2 F (36.8 C) (Oral)   Ht 5\' 10"  (1.778 m)   Wt 250 lb 9.6 oz (113.7 kg)   BMI 35.96 kg/m   Wt Readings from Last 3 Encounters:  09/28/17 250 lb 9.6 oz (113.7 kg)  07/05/17 251 lb (113.9 kg)  04/24/17 254 lb (115.2 kg)    Physical Exam  Constitutional: He is oriented to person, place, and time. He appears well-developed and well-nourished. No distress.  Eyes: Conjunctivae are normal. No scleral icterus.  Abdominal: Soft. There is no hepatosplenomegaly. There is no tenderness. There is no CVA  tenderness.  Genitourinary: Rectal exam shows external hemorrhoid (Large external hemorrhoid that is not thrombosed or bleeding currently, will likely need banding).  Musculoskeletal: Normal range of motion.  Neurological: He is alert and oriented to person, place, and time. Coordination normal.  Skin: Skin is warm and dry. No rash noted. He is not diaphoretic.  Psychiatric: He has a normal mood and affect. His behavior is normal.  Nursing note and vitals reviewed.     Assessment & Plan:   Problem List Items Addressed This Visit    None    Visit Diagnoses    Grade III hemorrhoids    -  Primary   Relevant Orders   Ambulatory referral to General Surgery   Constipation, unspecified constipation type       Relevant Medications   polyethylene glycol powder (GLYCOLAX/MIRALAX) powder   Encounter for immunization       Relevant Orders   Flu vaccine HIGH DOSE PF (Completed)       Follow up plan: Return if symptoms worsen or fail to improve.  Counseling provided for all of the vaccine components Orders Placed This Encounter  Procedures  . Flu vaccine HIGH DOSE PF  . Ambulatory referral to [place] Levert Heslop, MD [facility] 09/28/2017, 2:52 PM

## 2017-09-29 ENCOUNTER — Other Ambulatory Visit: Payer: Self-pay | Admitting: Family Medicine

## 2017-09-29 DIAGNOSIS — J439 Emphysema, unspecified: Secondary | ICD-10-CM

## 2017-10-23 ENCOUNTER — Ambulatory Visit: Payer: Self-pay | Admitting: Surgery

## 2017-10-23 DIAGNOSIS — K641 Second degree hemorrhoids: Secondary | ICD-10-CM | POA: Diagnosis not present

## 2017-10-23 DIAGNOSIS — Z9889 Other specified postprocedural states: Secondary | ICD-10-CM | POA: Diagnosis not present

## 2017-10-23 DIAGNOSIS — K420 Umbilical hernia with obstruction, without gangrene: Secondary | ICD-10-CM | POA: Diagnosis not present

## 2017-10-23 DIAGNOSIS — Z86010 Personal history of colonic polyps: Secondary | ICD-10-CM | POA: Diagnosis not present

## 2017-10-23 DIAGNOSIS — K644 Residual hemorrhoidal skin tags: Secondary | ICD-10-CM | POA: Diagnosis not present

## 2017-10-29 ENCOUNTER — Encounter: Payer: Self-pay | Admitting: Family Medicine

## 2017-10-29 ENCOUNTER — Ambulatory Visit (INDEPENDENT_AMBULATORY_CARE_PROVIDER_SITE_OTHER): Payer: Medicare Other | Admitting: Family Medicine

## 2017-10-29 VITALS — BP 122/75 | HR 71 | Temp 97.0°F | Ht 70.0 in | Wt 251.0 lb

## 2017-10-29 DIAGNOSIS — J439 Emphysema, unspecified: Secondary | ICD-10-CM

## 2017-10-29 DIAGNOSIS — M19012 Primary osteoarthritis, left shoulder: Secondary | ICD-10-CM | POA: Diagnosis not present

## 2017-10-29 DIAGNOSIS — N401 Enlarged prostate with lower urinary tract symptoms: Secondary | ICD-10-CM | POA: Diagnosis not present

## 2017-10-29 DIAGNOSIS — E6609 Other obesity due to excess calories: Secondary | ICD-10-CM

## 2017-10-29 DIAGNOSIS — Z131 Encounter for screening for diabetes mellitus: Secondary | ICD-10-CM

## 2017-10-29 DIAGNOSIS — R350 Frequency of micturition: Secondary | ICD-10-CM

## 2017-10-29 DIAGNOSIS — R739 Hyperglycemia, unspecified: Secondary | ICD-10-CM | POA: Diagnosis not present

## 2017-10-29 DIAGNOSIS — Z6835 Body mass index (BMI) 35.0-35.9, adult: Secondary | ICD-10-CM | POA: Diagnosis not present

## 2017-10-29 DIAGNOSIS — K219 Gastro-esophageal reflux disease without esophagitis: Secondary | ICD-10-CM

## 2017-10-29 MED ORDER — ALBUTEROL SULFATE HFA 108 (90 BASE) MCG/ACT IN AERS: 2.0000 | INHALATION_SPRAY | RESPIRATORY_TRACT | 5 refills | Status: DC | PRN

## 2017-10-29 MED ORDER — FLUTICASONE-SALMETEROL 250-50 MCG/DOSE IN AEPB: 1.0000 | INHALATION_SPRAY | Freq: Two times a day (BID) | RESPIRATORY_TRACT | 11 refills | Status: DC

## 2017-10-29 MED ORDER — MONTELUKAST SODIUM 10 MG PO TABS: 10.0000 mg | ORAL_TABLET | Freq: Every day | ORAL | 3 refills | Status: DC

## 2017-10-29 MED ORDER — MELOXICAM 15 MG PO TABS: 15.0000 mg | ORAL_TABLET | Freq: Every day | ORAL | 3 refills | Status: DC

## 2017-10-29 MED ORDER — OMEPRAZOLE 20 MG PO CPDR: 20.0000 mg | DELAYED_RELEASE_CAPSULE | Freq: Two times a day (BID) | ORAL | 3 refills | Status: DC

## 2017-10-29 MED ORDER — DOXAZOSIN MESYLATE 4 MG PO TABS: 4.0000 mg | ORAL_TABLET | Freq: Every day | ORAL | 3 refills | Status: DC

## 2017-10-29 MED ORDER — UMECLIDINIUM BROMIDE 62.5 MCG/INH IN AEPB: 1.0000 | INHALATION_SPRAY | Freq: Every day | RESPIRATORY_TRACT | 3 refills | Status: DC

## 2017-10-29 NOTE — Progress Notes (Signed)
BP 122/75   Pulse 71   Temp (!) 97 F (36.1 C) (Oral)   Ht 5' 10"  (1.778 m)   Wt 251 lb (113.9 kg)   BMI 36.01 kg/m    Subjective:    [name], male    DOB: 01-14-1946, 71 y.o.   MRN: [id number]  HPI: Craig Hurley is a 71 y.o. male presenting on 10/29/2017 for COPD (6 mo) and Gastroesophageal Reflux (Omeprazole no longer helping, having heartburn frequently)   HPI COPD recheck Patient is coming in for a COPD recheck.  He says he is doing very well in his breathing and denies any issues such as shortness of breath or wheezing.  He still has a chronic cough.  He says he is very content with his breathing and doing well overall.  GERD Patient is currently on omeprazole but says that the dose that he is on currently is not working and would like increased.  he denies any belching or burping but does have heartburn and indigestion. She denies any blood in her stool or lightheadedness or dizziness.  BPH with urinary frequency Patient follows up with urology for BPH but wants to know if he can get a PSA with [place] with his blood work and will continue to follow-up with them.  He says the symptoms have been relatively controlled and mild at this point  Left shoulder pain and achiness Patient describes achiness and pain in his left shoulder that bothers him off and off and is mild mostly but is starting to pick up in frequency especially with the weather changes.  He is currently using Tylenol it works most of the time but is wondering if there is something stronger that he can use for that left shoulder for now.  He denies any redness or warmth or swelling or loss of range of motion.  He just mostly has achiness especially when he gets up in the morning.  Relevant past medical, surgical, family and social history reviewed and updated as indicated. Interim medical history since our last visit reviewed. Allergies and medications reviewed and updated.  Review of Systems    Constitutional: Negative for chills and fever.  Eyes: Negative for discharge.  Respiratory: Negative for shortness of breath and wheezing.   Cardiovascular: Negative for chest pain and leg swelling.  Gastrointestinal: Positive for abdominal pain. Negative for abdominal distention, constipation, diarrhea, nausea and vomiting.  Genitourinary: Positive for frequency. Negative for decreased urine volume, dysuria, hematuria, penile pain and urgency.  Musculoskeletal: Positive for arthralgias. Negative for back pain and gait problem.  Skin: Negative for rash.  All other systems reviewed and are negative.   Per HPI unless specifically indicated above      Objective:    BP 122/75   Pulse 71   Temp (!) 97 F (36.1 C) (Oral)   Ht 5' 10"  (1.778 m)   Wt 251 lb (113.9 kg)   BMI 36.01 kg/m   Wt Readings from Last 3 Encounters:  10/29/17 251 lb (113.9 kg)  09/28/17 250 lb 9.6 oz (113.7 kg)  07/05/17 251 lb (113.9 kg)    Physical Exam  Constitutional: He is oriented to person, place, and time. He appears well-developed and well-nourished. No distress.  Eyes: Conjunctivae are normal. No scleral icterus.  Neck: Neck supple. No thyromegaly present.  Cardiovascular: Normal rate, regular rhythm, normal heart sounds and intact distal pulses.  No murmur heard. Pulmonary/Chest: Effort normal and breath sounds normal. No respiratory distress. He has  no wheezes.  Abdominal: Soft. Bowel sounds are normal. He exhibits no distension. There is no tenderness. There is no rebound and no guarding.  Musculoskeletal: Normal range of motion. He exhibits no edema.       Left shoulder: He exhibits tenderness (Tenderness with range of motion of the left shoulder). He exhibits normal range of motion and no deformity.  Lymphadenopathy:    He has no cervical adenopathy.  Neurological: He is alert and oriented to person, place, and time. Coordination normal.  Skin: Skin is warm and dry. No rash noted. He is not  diaphoretic.  Psychiatric: He has a normal mood and affect. His behavior is normal.  Nursing note and vitals reviewed.       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   Relevant Medications   umeclidinium bromide (INCRUSE ELLIPTA) 62.5 MCG/INH AEPB   montelukast (SINGULAIR) 10 MG tablet   Fluticasone-Salmeterol (ADVAIR DISKUS) 250-50 MCG/DOSE AEPB   albuterol (PROVENTIL HFA;VENTOLIN HFA) 108 (90 Base) MCG/ACT inhaler     Digestive   GERD (gastroesophageal reflux disease)   Relevant Medications   omeprazole (PRILOSEC) 20 MG capsule   Other Relevant Orders   CBC with Differential/Platelet (Completed)     Genitourinary   BPH (benign prostatic hyperplasia)   Relevant Orders   PSA, total and free (Completed)     Other   Obesity   Relevant Orders   Lipid panel (Completed)    Other Visit Diagnoses    Arthritis of left shoulder region       Relevant Medications   meloxicam (MOBIC) 15 MG tablet   Diabetes mellitus screening       Relevant Orders   CMP14+EGFR (Completed)       Follow up plan: Return in about 6 months (around 04/29/2018), or if symptoms worsen or fail to improve, for Recheck COPD and GERD.  Counseling provided for all of the vaccine components Orders Placed This Encounter  Procedures  . CBC with Differential/Platelet  . CMP14+EGFR  . Lipid panel  . PSA, total and free    Caryl Pina, MD [facility] 10/29/2017, 9:57 AM

## 2017-10-30 LAB — CBC WITH DIFFERENTIAL/PLATELET
BASOS: 0 %
Basophils Absolute: 0 10*3/uL (ref 0.0–0.2)
EOS (ABSOLUTE): 0.1 10*3/uL (ref 0.0–0.4)
EOS: 1 %
HEMATOCRIT: 44.8 % (ref 37.5–51.0)
Hemoglobin: 15.3 g/dL (ref 13.0–17.7)
IMMATURE GRANULOCYTES: 0 %
Immature Grans (Abs): 0 10*3/uL (ref 0.0–0.1)
LYMPHS: 20 %
Lymphocytes Absolute: 1.6 10*3/uL (ref 0.7–3.1)
MCH: 29.1 pg (ref 26.6–33.0)
MCHC: 34.2 g/dL (ref 31.5–35.7)
MCV: 85 fL (ref 79–97)
MONOS ABS: 0.6 10*3/uL (ref 0.1–0.9)
Monocytes: 7 %
NEUTROS PCT: 72 %
Neutrophils Absolute: 5.6 10*3/uL (ref 1.4–7.0)
PLATELETS: 226 10*3/uL (ref 150–379)
RBC: 5.26 x10E6/uL (ref 4.14–5.80)
RDW: 14.5 % (ref 12.3–15.4)
WBC: 7.8 10*3/uL (ref 3.4–10.8)

## 2017-10-30 LAB — CMP14+EGFR
A/G RATIO: 1.5 (ref 1.2–2.2)
ALT: 19 IU/L (ref 0–44)
AST: 15 IU/L (ref 0–40)
Albumin: 4.1 g/dL (ref 3.5–4.8)
Alkaline Phosphatase: 68 IU/L (ref 39–117)
BUN/Creatinine Ratio: 16 (ref 10–24)
BUN: 18 mg/dL (ref 8–27)
Bilirubin Total: 0.2 mg/dL (ref 0.0–1.2)
CALCIUM: 9.7 mg/dL (ref 8.6–10.2)
CO2: 24 mmol/L (ref 20–29)
CREATININE: 1.13 mg/dL (ref 0.76–1.27)
Chloride: 100 mmol/L (ref 96–106)
GFR calc Af Amer: 75 mL/min/{1.73_m2} (ref >59)
GFR, EST NON AFRICAN AMERICAN: 65 mL/min/{1.73_m2} (ref >59)
Globulin, Total: 2.8 g/dL (ref 1.5–4.5)
Glucose: 103 mg/dL — ABNORMAL HIGH (ref 65–99)
Potassium: 4.7 mmol/L (ref 3.5–5.2)
Sodium: 140 mmol/L (ref 134–144)
TOTAL PROTEIN: 6.9 g/dL (ref 6.0–8.5)

## 2017-10-30 LAB — LIPID PANEL
CHOL/HDL RATIO: 3 ratio (ref 0.0–5.0)
Cholesterol, Total: 173 mg/dL (ref 100–199)
HDL: 57 mg/dL (ref >39)
LDL CALC: 97 mg/dL (ref 0–99)
TRIGLYCERIDES: 95 mg/dL (ref 0–149)
VLDL CHOLESTEROL CAL: 19 mg/dL (ref 5–40)

## 2017-10-30 LAB — PSA, TOTAL AND FREE
PSA FREE PCT: 27 %
PSA, Free: 0.62 ng/mL
Prostate Specific Ag, Serum: 2.3 ng/mL (ref 0.0–4.0)

## 2017-11-02 LAB — SPECIMEN STATUS REPORT

## 2017-11-02 LAB — HGB A1C W/O EAG: Hgb A1c MFr Bld: 6.1 % — ABNORMAL HIGH (ref 4.8–5.6)

## 2018-05-01 ENCOUNTER — Ambulatory Visit (INDEPENDENT_AMBULATORY_CARE_PROVIDER_SITE_OTHER): Payer: Medicare Other | Admitting: Family Medicine

## 2018-05-01 ENCOUNTER — Encounter: Payer: Self-pay | Admitting: Family Medicine

## 2018-05-01 VITALS — BP 130/76 | HR 72 | Temp 96.9°F | Ht 70.0 in | Wt 243.0 lb

## 2018-05-01 DIAGNOSIS — J439 Emphysema, unspecified: Secondary | ICD-10-CM

## 2018-05-01 DIAGNOSIS — E6609 Other obesity due to excess calories: Secondary | ICD-10-CM

## 2018-05-01 DIAGNOSIS — Z6834 Body mass index (BMI) 34.0-34.9, adult: Secondary | ICD-10-CM | POA: Diagnosis not present

## 2018-05-01 DIAGNOSIS — L602 Onychogryphosis: Secondary | ICD-10-CM

## 2018-05-01 DIAGNOSIS — K219 Gastro-esophageal reflux disease without esophagitis: Secondary | ICD-10-CM

## 2018-05-01 DIAGNOSIS — R7303 Prediabetes: Secondary | ICD-10-CM | POA: Diagnosis not present

## 2018-05-01 DIAGNOSIS — M47812 Spondylosis without myelopathy or radiculopathy, cervical region: Secondary | ICD-10-CM | POA: Diagnosis not present

## 2018-05-01 LAB — BAYER DCA HB A1C WAIVED: HB A1C (BAYER DCA - WAIVED): 5.5 % (ref <7.0)

## 2018-05-01 NOTE — Progress Notes (Signed)
BP 130/76   Pulse 72   Temp (!) 96.9 F (36.1 C) (Oral)   Ht 5' 10"  (1.778 m)   Wt 243 lb (110.2 kg)   BMI 34.87 kg/m    Subjective:    [name], male    DOB: February 15, 1946, 72 y.o.   MRN: [id number]  HPI: Draden Cottingham is a 72 y.o. male presenting on 05/01/2018 for 6 month follow up; Neck Pain; and Fungus on fingernails   HPI Prediabetes Patient comes in today for recheck of his diabetes. Patient has been currently taking no medications but we have been monitoring, his A1c was 6.1 on last blood work. Patient is not currently on an ACE inhibitor/ARB. Patient has not seen an ophthalmologist this year. Patient denies any issues with their feet.   COPD Patient is coming in for COPD recheck today.  He is currently on Advair and Incruse.  He has a mild chronic cough but denies any major coughing spells or wheezing spells.  He has 0nighttime symptoms per week and 1daytime symptoms per week currently.   GERD Patient is currently on omeprazole.  She denies any major symptoms or abdominal pain or belching or burping. She denies any blood in her stool or lightheadedness or dizziness.   Patient also comes in complaining of whitish discoloration and thickening of fingernails on both hands that is gradually been creeping up over the past 3 months.  He says he has had issues with fungus in his toenails for a long time but is the first time he has had any issues with his fingernails.  He also complains of grooves developing into the center of some of his fingernails as well.  Relevant past medical, surgical, family and social history reviewed and updated as indicated. Interim medical history since our last visit reviewed. Allergies and medications reviewed and updated.  Review of Systems  Constitutional: Negative for chills and fever.  Eyes: Negative for discharge.  Respiratory: Negative for shortness of breath and wheezing.   Cardiovascular: Negative for chest pain and leg  swelling.  Musculoskeletal: Negative for back pain and gait problem.  Skin: Negative for rash.  Neurological: Negative for dizziness, weakness, light-headedness and numbness.  All other systems reviewed and are negative.   Per HPI unless specifically indicated above   Allergies as of 05/01/2018      Reactions   Penicillins Swelling      Medication List        Accurate as of 05/01/18 10:01 AM. Always use your most recent med list.          acetaminophen 325 MG tablet Commonly known as:  TYLENOL Take 650 mg by mouth every 6 (six) hours as needed.   albuterol 108 (90 Base) MCG/ACT inhaler Commonly known as:  PROVENTIL HFA;VENTOLIN HFA Inhale 2 puffs into the lungs every 4 (four) hours as needed for wheezing or shortness of breath.   cetirizine 10 MG tablet Commonly known as:  ZYRTEC Take 1 tablet (10 mg total) by mouth daily.   doxazosin 4 MG tablet Commonly known as:  CARDURA Take 1 tablet (4 mg total) by mouth daily.   EYE VITAMINS PO Take by mouth.   Fluticasone-Salmeterol 250-50 MCG/DOSE Aepb Commonly known as:  ADVAIR DISKUS Inhale 1 puff into the lungs 2 (two) times daily.   meloxicam 15 MG tablet Commonly known as:  MOBIC Take 1 tablet (15 mg total) by mouth daily.   montelukast 10 MG tablet Commonly known as:  SINGULAIR  Take 1 tablet (10 mg total) by mouth at bedtime.   omeprazole 20 MG capsule Commonly known as:  PRILOSEC Take 1 capsule (20 mg total) by mouth 2 (two) times daily before a meal.   polyethylene glycol powder powder Commonly known as:  GLYCOLAX/MIRALAX Take 17 g by mouth daily as needed.   TURMERIC CURCUMIN PO Take 1 Dose by mouth 2 (two) times daily.   umeclidinium bromide 62.5 MCG/INH Aepb Commonly known as:  INCRUSE ELLIPTA Inhale 1 puff into the lungs daily.   vitamin A 8000 UNIT capsule Take 8,000 Units by mouth daily.          Objective:    BP 130/76   Pulse 72   Temp (!) 96.9 F (36.1 C) (Oral)   Ht 5' 10"  (1.778  m)   Wt 243 lb (110.2 kg)   BMI 34.87 kg/m   Wt Readings from Last 3 Encounters:  05/01/18 243 lb (110.2 kg)  10/29/17 251 lb (113.9 kg)  09/28/17 250 lb 9.6 oz (113.7 kg)    Physical Exam  Constitutional: He is oriented to person, place, and time. He appears well-developed and well-nourished. No distress.  Eyes: Conjunctivae are normal. No scleral icterus.  Neck: Neck supple. No thyromegaly present.  Cardiovascular: Normal rate, regular rhythm, normal heart sounds and intact distal pulses.  No murmur heard. Pulmonary/Chest: Effort normal and breath sounds normal. No respiratory distress. He has no wheezes.  Musculoskeletal: Normal range of motion. He exhibits no edema.  Lymphadenopathy:    He has no cervical adenopathy.  Neurological: He is alert and oriented to person, place, and time. Coordination normal.  Skin: Skin is warm and dry. No rash noted. He is not diaphoretic.  Thickening and whitish discoloration of most of his fingernails on both hands, deep pitting in groups in both of his thumb nails  Psychiatric: He has a normal mood and affect. His behavior is normal.  Nursing note and vitals reviewed.   Results for orders placed or performed in visit on 10/29/17  CBC with Differential/Platelet  Result Value Ref Range   WBC 7.8 3.4 - 10.8 x10E3/uL   RBC 5.26 4.14 - 5.80 x10E6/uL   Hemoglobin 15.3 13.0 - 17.7 g/dL   Hematocrit 44.8 37.5 - 51.0 %   MCV 85 79 - 97 fL   MCH 29.1 26.6 - 33.0 pg   MCHC 34.2 31.5 - 35.7 g/dL   RDW 14.5 12.3 - 15.4 %   Platelets 226 150 - 379 x10E3/uL   Neutrophils 72 Not Estab. %   Lymphs 20 Not Estab. %   Monocytes 7 Not Estab. %   Eos 1 Not Estab. %   Basos 0 Not Estab. %   Neutrophils Absolute 5.6 1.4 - 7.0 x10E3/uL   Lymphocytes Absolute 1.6 0.7 - 3.1 x10E3/uL   Monocytes Absolute 0.6 0.1 - 0.9 x10E3/uL   EOS (ABSOLUTE) 0.1 0.0 - 0.4 x10E3/uL   Basophils Absolute 0.0 0.0 - 0.2 x10E3/uL   Immature Granulocytes 0 Not Estab. %   Immature  Grans (Abs) 0.0 0.0 - 0.1 x10E3/uL  CMP14+EGFR  Result Value Ref Range   Glucose 103 (H) 65 - 99 mg/dL   BUN 18 8 - 27 mg/dL   Creatinine, Ser 1.13 0.76 - 1.27 mg/dL   GFR calc non Af Amer 65 >59 mL/min/1.73   GFR calc Af Amer 75 >59 mL/min/1.73   BUN/Creatinine Ratio 16 10 - 24   Sodium 140 134 - 144 mmol/L   Potassium 4.7  3.5 - 5.2 mmol/L   Chloride 100 96 - 106 mmol/L   CO2 24 20 - 29 mmol/L   Calcium 9.7 8.6 - 10.2 mg/dL   Total Protein 6.9 6.0 - 8.5 g/dL   Albumin 4.1 3.5 - 4.8 g/dL   Globulin, Total 2.8 1.5 - 4.5 g/dL   Albumin/Globulin Ratio 1.5 1.2 - 2.2   Bilirubin Total 0.2 0.0 - 1.2 mg/dL   Alkaline Phosphatase 68 39 - 117 IU/L   AST 15 0 - 40 IU/L   ALT 19 0 - 44 IU/L  Lipid panel  Result Value Ref Range   Cholesterol, Total 173 100 - 199 mg/dL   Triglycerides 95 0 - 149 mg/dL   HDL 57 >39 mg/dL   VLDL Cholesterol Cal 19 5 - 40 mg/dL   LDL Calculated 97 0 - 99 mg/dL   Chol/HDL Ratio 3.0 0.0 - 5.0 ratio  PSA, total and free  Result Value Ref Range   Prostate Specific Ag, Serum 2.3 0.0 - 4.0 ng/mL   PSA, Free 0.62 N/A ng/mL   PSA, Free Pct 27.0 %  Hgb A1c w/o eAG  Result Value Ref Range   Hgb A1c MFr Bld 6.1 (H) 4.8 - 5.6 %  Specimen status report  Result Value Ref Range   specimen status report Comment       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     Digestive   GERD (gastroesophageal reflux disease)   Relevant Orders   CBC with Differential/Platelet     Other   Obesity   Prediabetes   Relevant Orders   Bayer DCA Hb A1c Waived   CMP14+EGFR    Other Visit Diagnoses    Osteoarthritis of cervical spine, unspecified spinal osteoarthritis complication status       Currently manageable with medications, if worsens then we will pursue further evaluation had previous bone spurs removed already   Nail thickening       Relevant Orders     Ambulatory referral to Dermatology      Concerning nails, could be fungal versus psoriasis, will send to dermatology for evaluation Follow up plan: Return in about 6 months (around 10/31/2018), or if symptoms worsen or fail to improve, for Recheck prediabetes and COPD.  Counseling provided for all of the vaccine components Orders Placed This Encounter  Procedures  . Bayer DCA Hb A1c Waived  . CBC with Differential/Platelet  . CMP14+EGFR  . Ambulatory referral to Dermatology    Caryl Pina, MD [facility] 05/01/2018, 10:01 AM

## 2018-05-02 LAB — CMP14+EGFR
A/G RATIO: 1.7 (ref 1.2–2.2)
ALT: 11 IU/L (ref 0–44)
AST: 14 IU/L (ref 0–40)
Albumin: 4.2 g/dL (ref 3.5–4.8)
Alkaline Phosphatase: 75 IU/L (ref 39–117)
BUN/Creatinine Ratio: 18 (ref 10–24)
BUN: 21 mg/dL (ref 8–27)
Bilirubin Total: 0.3 mg/dL (ref 0.0–1.2)
CO2: 24 mmol/L (ref 20–29)
Calcium: 9.1 mg/dL (ref 8.6–10.2)
Chloride: 102 mmol/L (ref 96–106)
Creatinine, Ser: 1.14 mg/dL (ref 0.76–1.27)
GFR calc Af Amer: 74 mL/min/{1.73_m2} (ref >59)
GFR calc non Af Amer: 64 mL/min/{1.73_m2} (ref >59)
Globulin, Total: 2.5 g/dL (ref 1.5–4.5)
Glucose: 102 mg/dL — ABNORMAL HIGH (ref 65–99)
POTASSIUM: 4.4 mmol/L (ref 3.5–5.2)
Sodium: 140 mmol/L (ref 134–144)
Total Protein: 6.7 g/dL (ref 6.0–8.5)

## 2018-05-02 LAB — CBC WITH DIFFERENTIAL/PLATELET
BASOS: 0 %
Basophils Absolute: 0 10*3/uL (ref 0.0–0.2)
EOS (ABSOLUTE): 0.1 10*3/uL (ref 0.0–0.4)
EOS: 2 %
HEMATOCRIT: 45.6 % (ref 37.5–51.0)
Hemoglobin: 15.6 g/dL (ref 13.0–17.7)
IMMATURE GRANS (ABS): 0 10*3/uL (ref 0.0–0.1)
IMMATURE GRANULOCYTES: 0 %
LYMPHS: 20 %
Lymphocytes Absolute: 1.4 10*3/uL (ref 0.7–3.1)
MCH: 28.1 pg (ref 26.6–33.0)
MCHC: 34.2 g/dL (ref 31.5–35.7)
MCV: 82 fL (ref 79–97)
MONOCYTES: 7 %
Monocytes Absolute: 0.5 10*3/uL (ref 0.1–0.9)
NEUTROS PCT: 71 %
Neutrophils Absolute: 4.9 10*3/uL (ref 1.4–7.0)
PLATELETS: 226 10*3/uL (ref 150–450)
RBC: 5.55 x10E6/uL (ref 4.14–5.80)
RDW: 15.3 % (ref 12.3–15.4)
WBC: 7 10*3/uL (ref 3.4–10.8)

## 2018-05-15 DIAGNOSIS — B351 Tinea unguium: Secondary | ICD-10-CM | POA: Diagnosis not present

## 2018-05-15 DIAGNOSIS — B352 Tinea manuum: Secondary | ICD-10-CM | POA: Diagnosis not present

## 2018-07-08 DIAGNOSIS — B351 Tinea unguium: Secondary | ICD-10-CM | POA: Diagnosis not present

## 2018-07-09 ENCOUNTER — Ambulatory Visit: Payer: Medicare Other | Admitting: *Deleted

## 2018-07-11 ENCOUNTER — Ambulatory Visit (INDEPENDENT_AMBULATORY_CARE_PROVIDER_SITE_OTHER): Payer: Medicare Other | Admitting: *Deleted

## 2018-07-11 VITALS — BP 107/68 | HR 78 | Ht 70.0 in | Wt 247.0 lb

## 2018-07-11 DIAGNOSIS — Z0000 Encounter for general adult medical examination without abnormal findings: Secondary | ICD-10-CM | POA: Diagnosis not present

## 2018-07-11 DIAGNOSIS — Z23 Encounter for immunization: Secondary | ICD-10-CM

## 2018-07-11 NOTE — Patient Instructions (Signed)
  [name] , Thank you for taking time to come for your Medicare Wellness Visit. I appreciate your ongoing commitment to your health goals. Please review the following plan we discussed and let me know if I can assist you in the future.   These are the goals we discussed: Goals    . Exercise 3x per week (30 min per time)    . Exercise 3x per week (30 min per time)    . Have 3 meals a day       This is a list of the screening recommended for you and due dates:  Health Maintenance  Topic Date Due  . Tetanus Vaccine  01/07/2018  . Flu Shot  06/27/2018  . Colon Cancer Screening  05/24/2025  .  Hepatitis C: One time screening is recommended by Center for Disease Control  (CDC) for  adults born from [birth year] through 1966.   Completed  . Pneumonia vaccines  Addressed

## 2018-07-11 NOTE — Progress Notes (Signed)
Subjective:   Craig Hurley is a 72 y.o. male who presents for Medicare Annual/Subsequent preventive examination.  Patient lives in [place] with his wife of 34 years. 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 They have 2 sons. He enjoys racing, fishing, and hunting. Patient states he does not exercise. Patient states his diet is semi-healthy. He does have stairs going into the home and they do have handrails. He has 1 dog in the home. Fall hazards were discussed with patient. He has not had any hospitalizations or surgeries in the last year.   Review of Systems:  Patient states his health is about the same as it was last year at this time.   Cardiac Risk Factors include: advanced age (>62men, >8 women);male gender;obesity (BMI >30kg/m2)     Objective:    Vitals: BP 107/68   Pulse 78   Ht 5\' 10"  (1.778 m)   Wt 247 lb (112 kg)   BMI 35.44 kg/m   Body mass index is 35.44 kg/m.  Advanced Directives 07/11/2018 07/05/2017 11/23/2015  Does Patient Have a Medical Advance Directive? Yes No No  Type of Paramedic of [place];Living will - -  Copy of [place] in Chart? No - copy requested - -  Would patient like information on creating a medical advance directive? Yes (MAU/Ambulatory/Procedural Areas - Information given) Yes (MAU/Ambulatory/Procedural Areas - Information given) -    Tobacco Social History   Tobacco Use  Smoking Status Former Smoker  . Types: Cigars  Smokeless Tobacco Current User  . Types: Snuff     Ready to quit: Not Answered Counseling given: Not Answered  Patient does not smoke he quit smoking cigars.  Past Medical History:  Diagnosis Date  . Allergy   . Arthritis   . Asthma    Past Surgical History:  Procedure Laterality Date  . BACK SURGERY  2005   lumbar  . FRACTURE SURGERY Right   . SHOULDER ARTHROSCOPY Right   . SKIN GRAFT FULL THICKNESS TRUNK    . VASECTOMY     Family History  Adopted: Yes  Problem  Relation Age of Onset  . Cancer Mother        colon cancer   Social History   Socioeconomic History  . Marital status: Married    Spouse name: Not on file  . Number of children: 2  . Years of education: 35  . Highest education level: 12th grade  Occupational History  . Not on file  Social Needs  . Financial resource strain: Not hard at all  . Food insecurity:    Worry: Never true    Inability: Never true  . Transportation needs:    Medical: No    Non-medical: No  Tobacco Use  . Smoking status: Former Smoker    Types: Cigars  . Smokeless tobacco: Current User    Types: Snuff  Substance and Sexual Activity  . Alcohol use: No  . Drug use: No  . Sexual activity: Yes  Lifestyle  . Physical activity:    Days per week: 0 days    Minutes per session: 0 min  . Stress: Not at all  Relationships  . Social connections:    Talks on phone: More than three times a week    Gets together: Once a week    Attends religious service: Never    Active member of club or organization: No    Attends meetings of clubs or organizations: Never  Relationship status: Married  Other Topics Concern  . Not on file  Social History Narrative  . Not on file    Outpatient Encounter Medications as of 07/11/2018  Medication Sig  . acetaminophen (TYLENOL) 325 MG tablet Take 650 mg by mouth every 6 (six) hours as needed.  Marland Kitchen albuterol (PROVENTIL HFA;VENTOLIN HFA) 108 (90 Base) MCG/ACT inhaler Inhale 2 puffs into the lungs every 4 (four) hours as needed for wheezing or shortness of breath.  . cetirizine (ZYRTEC) 10 MG tablet Take 1 tablet (10 mg total) by mouth daily.  Marland Kitchen doxazosin (CARDURA) 4 MG tablet Take 1 tablet (4 mg total) by mouth daily.  . Fluticasone-Salmeterol (ADVAIR DISKUS) 250-50 MCG/DOSE AEPB Inhale 1 puff into the lungs 2 (two) times daily.  . meloxicam (MOBIC) 15 MG tablet Take 1 tablet (15 mg total) by mouth daily.  . montelukast (SINGULAIR) 10 MG tablet Take 1 tablet (10 mg total) by  mouth at bedtime.  . Multiple Vitamins-Minerals (EYE VITAMINS PO) Take by mouth.  Marland Kitchen omeprazole (PRILOSEC) 20 MG capsule Take 1 capsule (20 mg total) by mouth 2 (two) times daily before a meal.  . polyethylene glycol powder (GLYCOLAX/MIRALAX) powder Take 17 g by mouth daily as needed.  . TURMERIC CURCUMIN PO Take 1 Dose by mouth 2 (two) times daily.  Marland Kitchen umeclidinium bromide (INCRUSE ELLIPTA) 62.5 MCG/INH AEPB Inhale 1 puff into the lungs daily.  . vitamin A 8000 UNIT capsule Take 8,000 Units by mouth daily.  . fluconazole (DIFLUCAN) 150 MG tablet    No facility-administered encounter medications on file as of 07/11/2018.     Activities of Daily Living In your present state of health, do you have any difficulty performing the following activities: 07/11/2018  Hearing? N  Comment Wears hearing aids   Vision? N  Comment wears glasses   Difficulty concentrating or making decisions? N  Walking or climbing stairs? Y  Comment at times due to arthritis   Dressing or bathing? N  Doing errands, shopping? N  Preparing Food and eating ? N  Using the Toilet? N  In the past six months, have you accidently leaked urine? N  Do you have problems with loss of bowel control? N  Managing your Medications? N  Managing your Finances? N  Housekeeping or managing your Housekeeping? N  Some recent data might be hidden    Patient Care Team: Dettinger, Fransisca Kaufmann, MD as PCP - General (Family Medicine)   Assessment:   This is a routine wellness examination for [name].  Exercise Activities and Dietary recommendations Current Exercise Habits: The patient does not participate in regular exercise at present, Exercise limited by: orthopedic condition(s)  Goals    . Exercise 3x per week (30 min per time)    . Exercise 3x per week (30 min per time)    . Have 3 meals a day       Fall Risk Fall Risk  07/11/2018 05/01/2018 10/29/2017 09/28/2017 07/05/2017  Falls in the past year? No No No No No   Is the patient's  home free of loose throw rugs in walkways, pet beds, electrical cords, etc?   yes      Grab bars in the bathroom? no      Handrails on the stairs?   yes      Adequate lighting?   yes  Timed Get Up and Go Performed:  Depression Screen PHQ 2/9 Scores 07/11/2018 05/01/2018 10/29/2017 09/28/2017  PHQ - 2 Score 0 0 0 0  Cognitive Function MMSE - Mini Mental State Exam 07/11/2018 07/05/2017  Orientation to time 4 4  Orientation to Place 5 5  Registration 3 3  Attention/ Calculation 4 5  Recall 3 2  Language- name 2 objects 2 2  Language- repeat 1 1  Language- follow 3 step command 3 3  Language- read & follow direction 1 1  Write a sentence 1 1  Copy design 1 1  Total score 28 28        Immunization History  Administered Date(s) Administered  . H1N1 01/14/2009  . Influenza Split 12/25/2005, 10/12/2009, 11/07/2010, 07/18/2011, 11/14/2012, 08/30/2013  . Influenza, High Dose Seasonal PF 09/28/2017  . Influenza,inj,Quad PF,6+ Mos 09/12/2014, 08/18/2015, 10/25/2016  . Pneumococcal Conjugate-13 01/18/2015  . Pneumococcal Polysaccharide-23 01/12/2011  . Td 11/22/1998, 07/11/2018  . Tdap 01/08/2008  . Zoster 01/17/2012    Qualifies for Shingles Vaccine? yes  Screening Tests Health Maintenance  Topic Date Due  . INFLUENZA VACCINE  06/27/2018  . COLONOSCOPY  05/24/2025  . TETANUS/TDAP  07/11/2028  . Hepatitis C Screening  Completed  . PNA vac Low Risk Adult  Addressed   Cancer Screenings: Lung: Low Dose CT Chest recommended if Age 11-80 years, 30 pack-year currently smoking OR have quit w/in 15years. Patient does not qualify. Colorectal:   Additional Screenings:  Hepatitis C Screening:      Plan:    Patient to follow up with Dr. Warrick Parisian as planned. Patient will get flu vaccine when they become available.    I have personally reviewed and noted the following in the patient's chart:   . Medical and social history . Use of alcohol, tobacco or illicit drugs  . Current  medications and supplements . Functional ability and status . Nutritional status . Physical activity . Advanced directives . List of other physicians . Hospitalizations, surgeries, and ER visits in previous 12 months . Vitals . Screenings to include cognitive, depression, and falls . Referrals and appointments  In addition, I have reviewed and discussed with patient certain preventive protocols, quality metrics, and best practice recommendations. A written personalized care plan for preventive services as well as general preventive health recommendations were provided to patient.     Gareth Morgan, LPN  [id number]

## 2018-07-31 DIAGNOSIS — H2513 Age-related nuclear cataract, bilateral: Secondary | ICD-10-CM | POA: Diagnosis not present

## 2018-07-31 DIAGNOSIS — H353131 Nonexudative age-related macular degeneration, bilateral, early dry stage: Secondary | ICD-10-CM | POA: Diagnosis not present

## 2018-07-31 DIAGNOSIS — H04123 Dry eye syndrome of bilateral lacrimal glands: Secondary | ICD-10-CM | POA: Diagnosis not present

## 2018-07-31 DIAGNOSIS — H43393 Other vitreous opacities, bilateral: Secondary | ICD-10-CM | POA: Diagnosis not present

## 2018-09-11 DIAGNOSIS — Z23 Encounter for immunization: Secondary | ICD-10-CM | POA: Diagnosis not present

## 2018-09-11 DIAGNOSIS — H2513 Age-related nuclear cataract, bilateral: Secondary | ICD-10-CM | POA: Diagnosis not present

## 2018-09-29 ENCOUNTER — Encounter: Payer: Self-pay | Admitting: Family Medicine

## 2018-10-03 DIAGNOSIS — H353132 Nonexudative age-related macular degeneration, bilateral, intermediate dry stage: Secondary | ICD-10-CM | POA: Diagnosis not present

## 2018-10-03 DIAGNOSIS — H2513 Age-related nuclear cataract, bilateral: Secondary | ICD-10-CM | POA: Diagnosis not present

## 2018-10-03 DIAGNOSIS — H40013 Open angle with borderline findings, low risk, bilateral: Secondary | ICD-10-CM | POA: Diagnosis not present

## 2018-10-03 DIAGNOSIS — H25011 Cortical age-related cataract, right eye: Secondary | ICD-10-CM | POA: Diagnosis not present

## 2018-10-03 DIAGNOSIS — H25013 Cortical age-related cataract, bilateral: Secondary | ICD-10-CM | POA: Diagnosis not present

## 2018-10-03 DIAGNOSIS — H2511 Age-related nuclear cataract, right eye: Secondary | ICD-10-CM | POA: Diagnosis not present

## 2018-10-03 DIAGNOSIS — H35033 Hypertensive retinopathy, bilateral: Secondary | ICD-10-CM | POA: Diagnosis not present

## 2018-10-08 ENCOUNTER — Encounter: Payer: Self-pay | Admitting: Nurse Practitioner

## 2018-10-08 ENCOUNTER — Ambulatory Visit (INDEPENDENT_AMBULATORY_CARE_PROVIDER_SITE_OTHER): Payer: Medicare Other | Admitting: Nurse Practitioner

## 2018-10-08 VITALS — BP 116/70 | HR 73 | Temp 97.0°F | Ht 70.0 in | Wt 258.0 lb

## 2018-10-08 DIAGNOSIS — J069 Acute upper respiratory infection, unspecified: Secondary | ICD-10-CM

## 2018-10-08 DIAGNOSIS — B351 Tinea unguium: Secondary | ICD-10-CM | POA: Diagnosis not present

## 2018-10-08 MED ORDER — HYDROCODONE-HOMATROPINE 5-1.5 MG/5ML PO SYRP: 5.0000 mL | ORAL_SOLUTION | Freq: Four times a day (QID) | ORAL | 0 refills | Status: DC | PRN

## 2018-10-08 MED ORDER — AZITHROMYCIN 250 MG PO TABS: ORAL_TABLET | ORAL | 0 refills | Status: DC

## 2018-10-08 NOTE — Progress Notes (Signed)
   Subjective:    [name], male    DOB: [birth year], 72 y.o.   MRN: [id number]   Chief Complaint: chest congestion and Cough   HPI Patient come sin today c/o cough and congestion. Chest is sore from coughing. This started 4-5 days ago. He has been using OTC meds with no relief.   Review of Systems  Constitutional: Negative for chills and fever.  HENT: Positive for congestion and rhinorrhea. Negative for ear pain, sore throat and trouble swallowing.   Respiratory: Positive for cough (productive at times- greenish).   Cardiovascular: Negative.   Gastrointestinal: Negative.   Neurological: Negative for dizziness and headaches.  Psychiatric/Behavioral: Negative.   All other systems reviewed and are negative.      Objective:   Physical Exam  Constitutional: He is oriented to person, place, and time. He appears well-developed and well-nourished. No distress.  HENT:  Right Ear: External ear normal.  Left Ear: External ear normal.  Nose: Mucosal edema and rhinorrhea present. Right sinus exhibits no maxillary sinus tenderness and no frontal sinus tenderness. Left sinus exhibits no maxillary sinus tenderness and no frontal sinus tenderness.  Mouth/Throat: Uvula is midline, oropharynx is clear and moist and mucous membranes are normal.  Eyes: Pupils are equal, round, and reactive to light. EOM are normal.  Neck: Normal range of motion. Neck supple.  Cardiovascular: Normal rate and regular rhythm.  Pulmonary/Chest: Effort normal and breath sounds normal. He has no wheezes.  Deep dry cough  Lymphadenopathy:    He has no cervical adenopathy.  Neurological: He is alert and oriented to person, place, and time.  Skin: Skin is warm.  Psychiatric: He has a normal mood and affect. His behavior is normal. Thought content normal.  Nursing note and vitals reviewed.  BP 116/70   Pulse 73   Temp (!) 97 F (36.1 C) (Oral)   Ht 5\' 10"  (1.778 m)   Wt 258 lb (117 kg)   SpO2  97%   BMI 37.02 kg/m         Assessment & Plan:  Drayven Marchena in today with chief complaint of chest congestion and Cough   1. Upper respiratory infection with cough and congestion 1. Take meds as prescribed 2. Use a cool mist humidifier especially during the winter months and when heat has been humid. 3. Use saline nose sprays frequently 4. Saline irrigations of the nose can be very helpful if done frequently.  * 4X daily for 1 week*  * Use of a nettie pot can be helpful with this. Follow directions with this* 5. Drink plenty of fluids 6. Keep thermostat turn down low 7.For any cough or congestion  Use plain Mucinex- regular strength or max strength is fine   * Children- consult with Pharmacist for dosing 8. For fever or aces or pains- take tylenol or ibuprofen appropriate for age and weight.  * for fevers greater than 101 orally you may alternate ibuprofen and tylenol every  3 hours.    - azithromycin (ZITHROMAX Z-PAK) 250 MG tablet; As directed  Dispense: 6 tablet; Refill: 0 - HYDROcodone-homatropine (HYCODAN) 5-1.5 MG/5ML syrup; Take 5 mLs by mouth every 6 (six) hours as needed for cough.  Dispense: 120 mL; Refill: 0   Mary-Margaret Hassell Done, FNP

## 2018-10-08 NOTE — Patient Instructions (Signed)

## 2018-10-14 ENCOUNTER — Encounter: Payer: Self-pay | Admitting: Nurse Practitioner

## 2018-10-14 ENCOUNTER — Ambulatory Visit (INDEPENDENT_AMBULATORY_CARE_PROVIDER_SITE_OTHER): Payer: Medicare Other | Admitting: Nurse Practitioner

## 2018-10-14 VITALS — BP 119/73 | HR 65 | Temp 97.0°F | Ht 70.0 in | Wt 253.0 lb

## 2018-10-14 DIAGNOSIS — R059 Cough, unspecified: Secondary | ICD-10-CM

## 2018-10-14 DIAGNOSIS — R05 Cough: Secondary | ICD-10-CM

## 2018-10-14 MED ORDER — METHYLPREDNISOLONE ACETATE 80 MG/ML IJ SUSP
80.0000 mg | Freq: Once | INTRAMUSCULAR | Status: AC
  Administered 2018-10-14: 80 mg via INTRAMUSCULAR

## 2018-10-14 NOTE — Patient Instructions (Signed)

## 2018-10-14 NOTE — Progress Notes (Signed)
   Subjective:    [name], male    DOB: 1946/01/26, 71 y.o.   MRN: [id number]   Chief Complaint: cough  HPI Patient comes in today still c/o cough. He was originally seen on 10/08/18 and was dx with uri with cough and was given z pak and hycodan. He says he is still coughing. He needs to has cataract surgery and needs to stop coughing.   Review of Systems  Constitutional: Negative.   HENT: Negative.   Respiratory: Positive for cough. Negative for shortness of breath.   Cardiovascular: Negative.   Gastrointestinal: Negative.   Genitourinary: Negative.   Neurological: Negative.   Psychiatric/Behavioral: Negative.   All other systems reviewed and are negative.      Objective:   Physical Exam  Constitutional: He is oriented to person, place, and time. He appears well-developed and well-nourished.  HENT:  Head: Normocephalic.  Nose: Nose normal.  Mouth/Throat: Oropharynx is clear and moist.  Eyes: Pupils are equal, round, and reactive to light. EOM are normal.  Neck: Normal range of motion and phonation normal. Neck supple. No JVD present. Carotid bruit is not present. No thyroid mass and no thyromegaly present.  Cardiovascular: Normal rate and regular rhythm.  Pulmonary/Chest: Effort normal and breath sounds normal. No respiratory distress.  Abdominal: Soft. Normal appearance, normal aorta and bowel sounds are normal. There is no tenderness.  Musculoskeletal: Normal range of motion.  Lymphadenopathy:    He has no cervical adenopathy.  Neurological: He is alert and oriented to person, place, and time.  Skin: Skin is warm and dry.  Psychiatric: He has a normal mood and affect. His behavior is normal. Judgment and thought content normal.  Nursing note and vitals reviewed.  BP 119/73   Pulse 65   Temp (!) 97 F (36.1 C) (Oral)   Ht 5\' 10"  (1.778 m)   Wt 253 lb (114.8 kg)   BMI 36.30 kg/m        Assessment & Plan:  Craig Hurley in today with  chief complaint of No chief complaint on file.   1. Cough Force fluids Humidifier Continue mucinex RTO if no better in 2-3 days - methylPREDNISolone acetate (DEPO-MEDROL) injection 80 mg  Mary-Margaret Hassell Done, FNP

## 2018-10-22 DIAGNOSIS — H25811 Combined forms of age-related cataract, right eye: Secondary | ICD-10-CM | POA: Diagnosis not present

## 2018-10-22 DIAGNOSIS — H2511 Age-related nuclear cataract, right eye: Secondary | ICD-10-CM | POA: Diagnosis not present

## 2018-10-30 ENCOUNTER — Encounter: Payer: Self-pay | Admitting: Family Medicine

## 2018-10-30 ENCOUNTER — Ambulatory Visit (INDEPENDENT_AMBULATORY_CARE_PROVIDER_SITE_OTHER): Payer: Medicare Other | Admitting: Family Medicine

## 2018-10-30 VITALS — BP 106/72 | HR 86 | Temp 97.2°F | Ht 70.0 in | Wt 249.2 lb

## 2018-10-30 DIAGNOSIS — R7303 Prediabetes: Secondary | ICD-10-CM | POA: Diagnosis not present

## 2018-10-30 DIAGNOSIS — M19012 Primary osteoarthritis, left shoulder: Secondary | ICD-10-CM | POA: Diagnosis not present

## 2018-10-30 DIAGNOSIS — J439 Emphysema, unspecified: Secondary | ICD-10-CM

## 2018-10-30 DIAGNOSIS — K219 Gastro-esophageal reflux disease without esophagitis: Secondary | ICD-10-CM

## 2018-10-30 LAB — BAYER DCA HB A1C WAIVED: HB A1C (BAYER DCA - WAIVED): 5.9 % (ref <7.0)

## 2018-10-30 LAB — CMP14+EGFR
ALBUMIN: 4.1 g/dL (ref 3.5–4.8)
ALT: 12 IU/L (ref 0–44)
AST: 14 IU/L (ref 0–40)
Albumin/Globulin Ratio: 1.6 (ref 1.2–2.2)
Alkaline Phosphatase: 73 IU/L (ref 39–117)
BUN / CREAT RATIO: 19 (ref 10–24)
BUN: 22 mg/dL (ref 8–27)
Bilirubin Total: 0.3 mg/dL (ref 0.0–1.2)
CO2: 22 mmol/L (ref 20–29)
Calcium: 9.5 mg/dL (ref 8.6–10.2)
Chloride: 102 mmol/L (ref 96–106)
Creatinine, Ser: 1.14 mg/dL (ref 0.76–1.27)
GFR calc Af Amer: 73 mL/min/{1.73_m2} (ref >59)
GFR calc non Af Amer: 63 mL/min/{1.73_m2} (ref >59)
GLUCOSE: 105 mg/dL — AB (ref 65–99)
Globulin, Total: 2.5 g/dL (ref 1.5–4.5)
Potassium: 4.6 mmol/L (ref 3.5–5.2)
Sodium: 140 mmol/L (ref 134–144)
Total Protein: 6.6 g/dL (ref 6.0–8.5)

## 2018-10-30 MED ORDER — FLUTICASONE PROPIONATE 50 MCG/ACT NA SUSP: 2.0000 | Freq: Every day | NASAL | 6 refills | Status: DC | PRN

## 2018-10-30 MED ORDER — FLUTICASONE-SALMETEROL 250-50 MCG/DOSE IN AEPB: 1.0000 | INHALATION_SPRAY | Freq: Two times a day (BID) | RESPIRATORY_TRACT | 11 refills | Status: DC

## 2018-10-30 MED ORDER — DOXAZOSIN MESYLATE 4 MG PO TABS: 4.0000 mg | ORAL_TABLET | Freq: Every day | ORAL | 3 refills | Status: DC

## 2018-10-30 MED ORDER — MONTELUKAST SODIUM 10 MG PO TABS: 10.0000 mg | ORAL_TABLET | Freq: Every day | ORAL | 3 refills | Status: DC

## 2018-10-30 MED ORDER — UMECLIDINIUM BROMIDE 62.5 MCG/INH IN AEPB: 1.0000 | INHALATION_SPRAY | Freq: Every day | RESPIRATORY_TRACT | 3 refills | Status: DC

## 2018-10-30 MED ORDER — MELOXICAM 15 MG PO TABS: 15.0000 mg | ORAL_TABLET | Freq: Every day | ORAL | 3 refills | Status: DC

## 2018-10-30 MED ORDER — ALBUTEROL SULFATE HFA 108 (90 BASE) MCG/ACT IN AERS: 2.0000 | INHALATION_SPRAY | RESPIRATORY_TRACT | 5 refills | Status: DC | PRN

## 2018-10-30 NOTE — Progress Notes (Signed)
BP 106/72   Pulse 86   Temp (!) 97.2 F (36.2 C) (Oral)   Ht 5' 10"  (1.778 m)   Wt 249 lb 3.2 oz (113 kg)   BMI 35.76 kg/m    Subjective:    [name], male    DOB: [birth year], 72 y.o.   MRN: [id number]  HPI: Craig Hurley is a 72 y.o. male presenting on 10/30/2018 for COPD (6 month follow up) and prediabetes   HPI COPD Patient is coming in for COPD recheck today.  He is currently on albuterol and Advair and Singulair and Incruse.  He has a mild chronic cough.  He has 0nighttime symptoms per week and 7daytime symptoms per week currently.  Patient does complain of waking up with some congestion is mostly from his sinuses in the morning will drain into his throat and he has a lot of coughing in the morning and then once he clears that he is fine for the rest of the day until the next morning.  Prediabetes Patient comes in today for recheck of his diabetes. Patient has been currently taking no medication and his diet controlled. Patient is not currently on an ACE inhibitor/ARB. Patient has seen an ophthalmologist this year. Patient denies any issues with their feet.   GERD Patient is currently on no medication currently and has been doing well for the most part.  She denies any major symptoms or abdominal pain or belching or burping. She denies any blood in her stool or lightheadedness or dizziness.   Relevant past medical, surgical, family and social history reviewed and updated as indicated. Interim medical history since our last visit reviewed. Allergies and medications reviewed and updated.  Review of Systems  Constitutional: Negative for chills and fever.  Eyes: Negative for discharge.  Respiratory: Positive for cough. Negative for shortness of breath and wheezing.   Cardiovascular: Negative for chest pain and leg swelling.  Musculoskeletal: Negative for back pain and gait problem.  Skin: Negative for rash.  Neurological: Positive for dizziness  (Occasional dizziness when he stands up). Negative for weakness, light-headedness and numbness.  All other systems reviewed and are negative.   Per HPI unless specifically indicated above   Allergies as of 10/30/2018      Reactions   Penicillins Swelling      Medication List        Accurate as of 10/30/18  9:23 AM. Always use your most recent med list.          acetaminophen 325 MG tablet Commonly known as:  TYLENOL Take 650 mg by mouth every 6 (six) hours as needed.   albuterol 108 (90 Base) MCG/ACT inhaler Commonly known as:  PROVENTIL HFA;VENTOLIN HFA Inhale 2 puffs into the lungs every 4 (four) hours as needed for wheezing or shortness of breath.   cetirizine 10 MG tablet Commonly known as:  ZYRTEC Take 1 tablet (10 mg total) by mouth daily.   doxazosin 4 MG tablet Commonly known as:  CARDURA Take 1 tablet (4 mg total) by mouth daily.   EYE VITAMINS PO Take by mouth.   Fluticasone-Salmeterol 250-50 MCG/DOSE Aepb Commonly known as:  ADVAIR Inhale 1 puff into the lungs 2 (two) times daily.   meloxicam 15 MG tablet Commonly known as:  MOBIC Take 1 tablet (15 mg total) by mouth daily.   montelukast 10 MG tablet Commonly known as:  SINGULAIR Take 1 tablet (10 mg total) by mouth at bedtime.   omeprazole 20 MG capsule  Commonly known as:  PRILOSEC Take 1 capsule (20 mg total) by mouth 2 (two) times daily before a meal.   polyethylene glycol powder powder Commonly known as:  GLYCOLAX/MIRALAX Take 17 g by mouth daily as needed.   TURMERIC CURCUMIN PO Take 1 Dose by mouth 2 (two) times daily.   umeclidinium bromide 62.5 MCG/INH Aepb Commonly known as:  INCRUSE ELLIPTA Inhale 1 puff into the lungs daily.   vitamin A 8000 UNIT capsule Take 8,000 Units by mouth daily.          Objective:    BP 106/72   Pulse 86   Temp (!) 97.2 F (36.2 C) (Oral)   Ht 5' 10"  (1.778 m)   Wt 249 lb 3.2 oz (113 kg)   BMI 35.76 kg/m   Wt Readings from Last 3  Encounters:  10/30/18 249 lb 3.2 oz (113 kg)  10/14/18 253 lb (114.8 kg)  10/08/18 258 lb (117 kg)    Physical Exam  Constitutional: He is oriented to person, place, and time. He appears well-developed and well-nourished. No distress.  Eyes: Conjunctivae are normal. No scleral icterus.  Neck: Neck supple. No thyromegaly present.  Cardiovascular: Normal rate, regular rhythm, normal heart sounds and intact distal pulses.  No murmur heard. Pulmonary/Chest: Effort normal and breath sounds normal. No respiratory distress. He has no wheezes.  Musculoskeletal: Normal range of motion. He exhibits no edema.  Lymphadenopathy:    He has no cervical adenopathy.  Neurological: He is alert and oriented to person, place, and time. Coordination normal.  Skin: Skin is warm and dry. No rash noted. He is not diaphoretic.  Psychiatric: He has a normal mood and affect. His behavior is normal.  Nursing note and vitals reviewed.       Assessment & Plan:   Problem List Items Addressed This Visit      Respiratory   COPD (chronic obstructive pulmonary disease) with emphysema (HCC)   Relevant Medications   albuterol (PROVENTIL HFA;VENTOLIN HFA) 108 (90 Base) MCG/ACT inhaler   Fluticasone-Salmeterol (ADVAIR DISKUS) 250-50 MCG/DOSE AEPB   montelukast (SINGULAIR) 10 MG tablet   umeclidinium bromide (INCRUSE ELLIPTA) 62.5 MCG/INH AEPB   fluticasone (FLONASE) 50 MCG/ACT nasal spray     Digestive   GERD (gastroesophageal reflux disease)     Other   Prediabetes - Primary   Relevant Orders   Bayer DCA Hb A1c Waived   CMP14+EGFR    Other Visit Diagnoses    Arthritis of left shoulder region       Refilled medications, meloxicam is working   Relevant Medications   meloxicam (MOBIC) 15 MG tablet       Follow up plan: Return in about 6 months (around 05/01/2019), or if symptoms worsen or fail to improve, for Prediabetes and COPD and GERD recheck.  Counseling provided for all of the vaccine  components Orders Placed This Encounter  Procedures  . Bayer DCA Hb A1c Waived  . Pioneer Dettinger, MD [facility] 10/30/2018, 9:23 AM

## 2018-11-05 ENCOUNTER — Emergency Department (HOSPITAL_COMMUNITY): Payer: Medicare Other

## 2018-11-05 ENCOUNTER — Emergency Department (HOSPITAL_COMMUNITY)
Admission: EM | Admit: 2018-11-05 | Discharge: 2018-11-05 | Disposition: A | Discharge status: Home / Self Care | Payer: Medicare Other | Attending: Emergency Medicine | Admitting: Emergency Medicine

## 2018-11-05 ENCOUNTER — Encounter (HOSPITAL_COMMUNITY): Payer: Self-pay | Admitting: Emergency Medicine

## 2018-11-05 ENCOUNTER — Other Ambulatory Visit: Payer: Self-pay

## 2018-11-05 ENCOUNTER — Ambulatory Visit: Payer: Medicare Other | Admitting: Family Medicine

## 2018-11-05 DIAGNOSIS — F17220 Nicotine dependence, chewing tobacco, uncomplicated: Secondary | ICD-10-CM | POA: Insufficient documentation

## 2018-11-05 DIAGNOSIS — R410 Disorientation, unspecified: Secondary | ICD-10-CM | POA: Insufficient documentation

## 2018-11-05 DIAGNOSIS — W19XXXA Unspecified fall, initial encounter: Secondary | ICD-10-CM

## 2018-11-05 DIAGNOSIS — S0990XA Unspecified injury of head, initial encounter: Secondary | ICD-10-CM | POA: Diagnosis not present

## 2018-11-05 DIAGNOSIS — S060X9A Concussion with loss of consciousness of unspecified duration, initial encounter: Secondary | ICD-10-CM | POA: Diagnosis not present

## 2018-11-05 DIAGNOSIS — M25552 Pain in left hip: Secondary | ICD-10-CM | POA: Insufficient documentation

## 2018-11-05 DIAGNOSIS — Z79899 Other long term (current) drug therapy: Secondary | ICD-10-CM | POA: Insufficient documentation

## 2018-11-05 DIAGNOSIS — R110 Nausea: Secondary | ICD-10-CM | POA: Diagnosis not present

## 2018-11-05 DIAGNOSIS — M545 Low back pain: Secondary | ICD-10-CM | POA: Insufficient documentation

## 2018-11-05 DIAGNOSIS — M5489 Other dorsalgia: Secondary | ICD-10-CM | POA: Diagnosis not present

## 2018-11-05 DIAGNOSIS — S3992XA Unspecified injury of lower back, initial encounter: Secondary | ICD-10-CM | POA: Diagnosis not present

## 2018-11-05 DIAGNOSIS — R52 Pain, unspecified: Secondary | ICD-10-CM | POA: Diagnosis not present

## 2018-11-05 DIAGNOSIS — G44309 Post-traumatic headache, unspecified, not intractable: Secondary | ICD-10-CM | POA: Diagnosis not present

## 2018-11-05 DIAGNOSIS — R0902 Hypoxemia: Secondary | ICD-10-CM | POA: Diagnosis not present

## 2018-11-05 DIAGNOSIS — S79911A Unspecified injury of right hip, initial encounter: Secondary | ICD-10-CM | POA: Diagnosis not present

## 2018-11-05 DIAGNOSIS — J45909 Unspecified asthma, uncomplicated: Secondary | ICD-10-CM | POA: Insufficient documentation

## 2018-11-05 DIAGNOSIS — M25551 Pain in right hip: Secondary | ICD-10-CM | POA: Diagnosis not present

## 2018-11-05 DIAGNOSIS — R55 Syncope and collapse: Secondary | ICD-10-CM | POA: Diagnosis not present

## 2018-11-05 IMAGING — DX DG PELVIS 1-2V
1 series · 1 of 1 positions shown · non-contrast
Comparison: None.

CLINICAL DATA: Recent fall with pain in the low back and posterior
right hip

EXAM:
PELVIS - 1-2 VIEW

[pelvis ap]
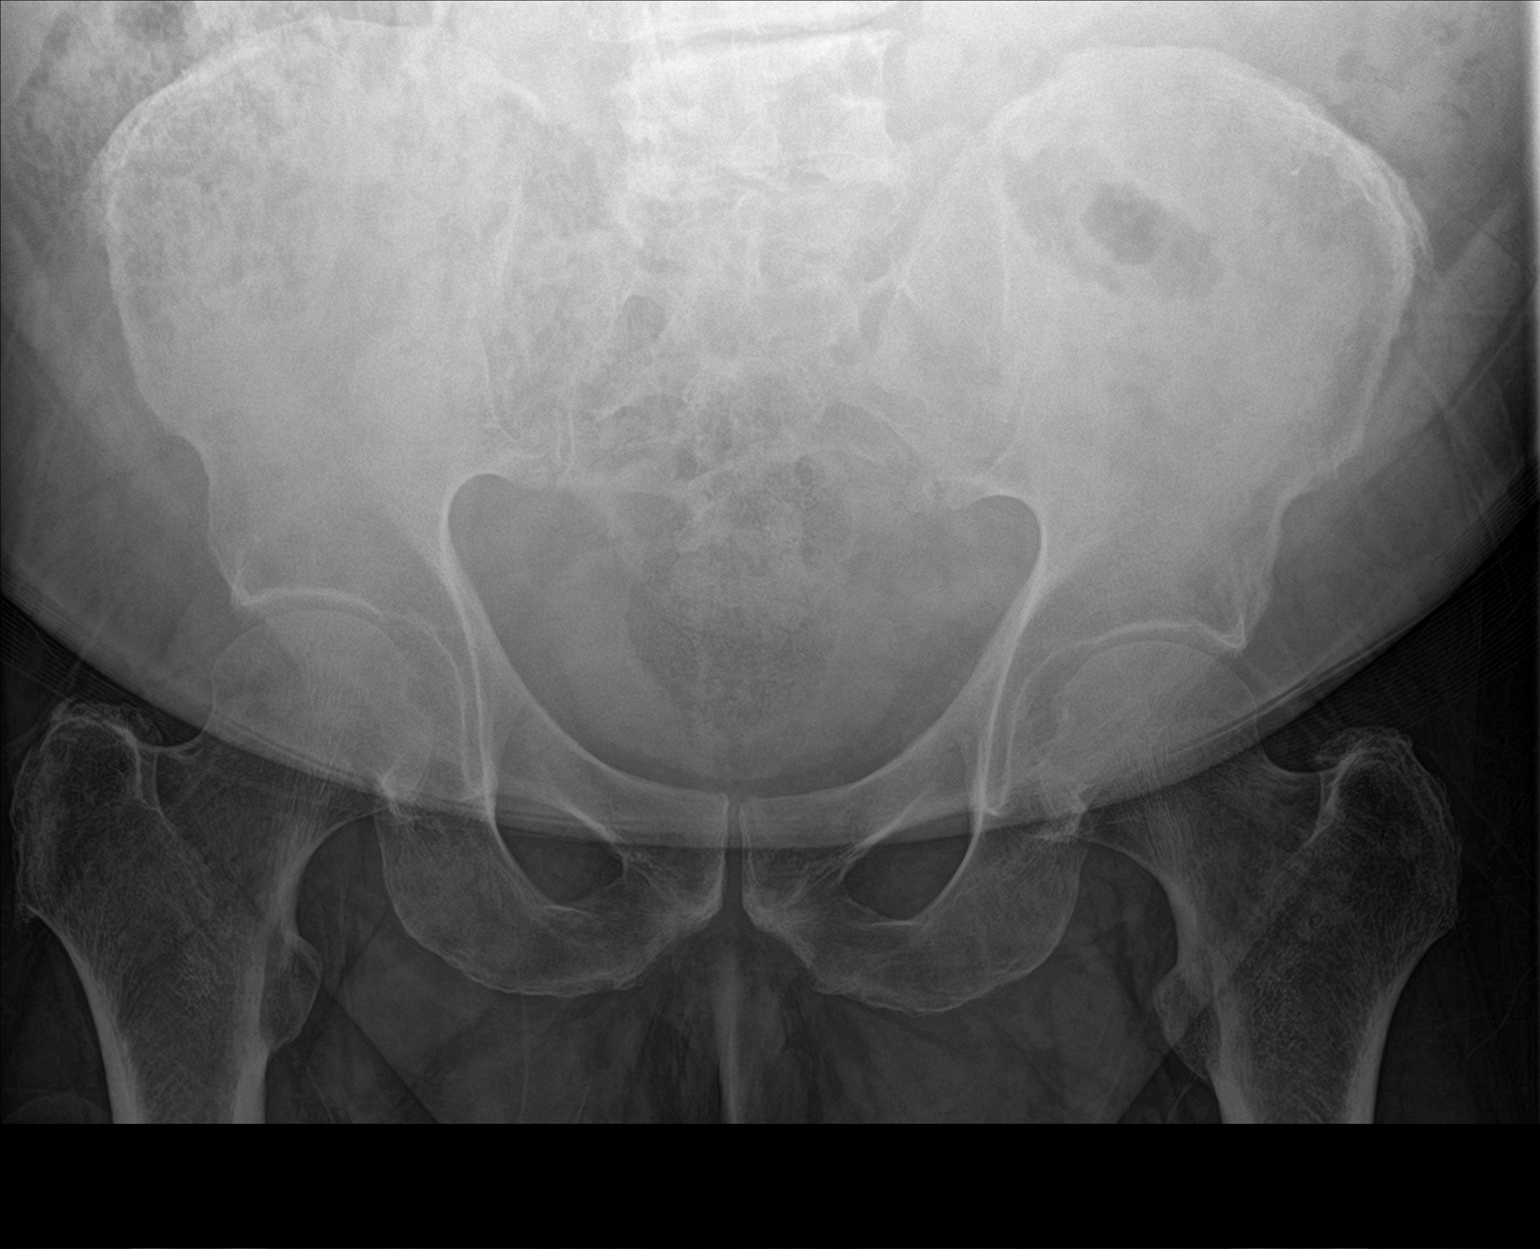

[1 of 1 positions shown; findings below may reference images not displayed]

FINDINGS: No acute fracture is seen. For age there is only mild degenerative
joint disease of the hips. The pelvic rami are intact. The SI joints
appear corticated.
IMPRESSION: No acute fracture. Only mild degenerative joint disease of the hips
for age.

## 2018-11-05 IMAGING — CT CT HEAD W/O CM
3 series · 16 of 47 positions shown, 19 images · non-contrast
Comparison: None.

CLINICAL DATA: Posttraumatic headache after fall.

EXAM:
CT HEAD WITHOUT CONTRAST
TECHNIQUE: Contiguous axial images were obtained from the base of the skull
through the vertex without intravenous contrast.

[Series 2: head trauma wo · axial · 0.44mm/px · z∈[+106,+231]mm · 10 of 31 slices shown, 13 images]
[im 3/31  brain]
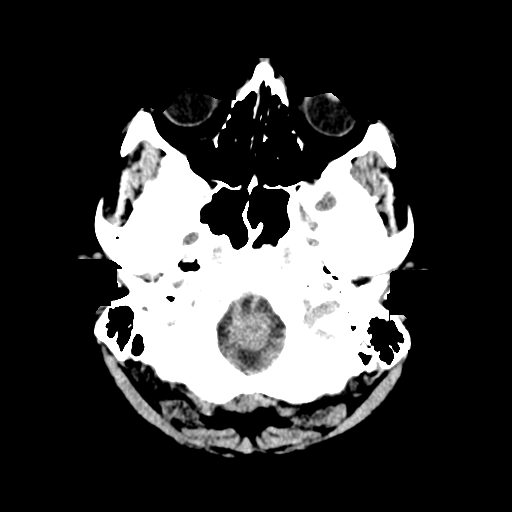
[im 3/31  bone]
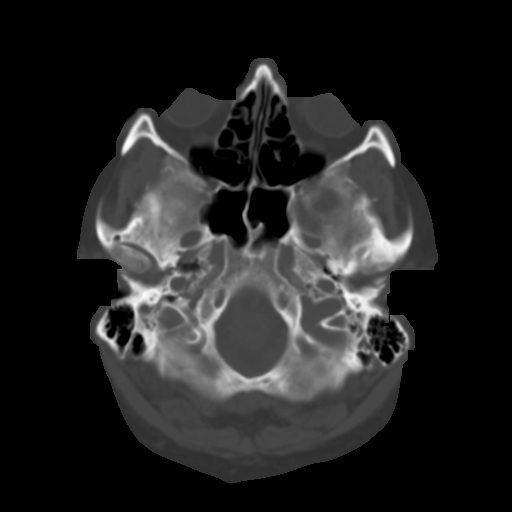
[im 6/31  brain]
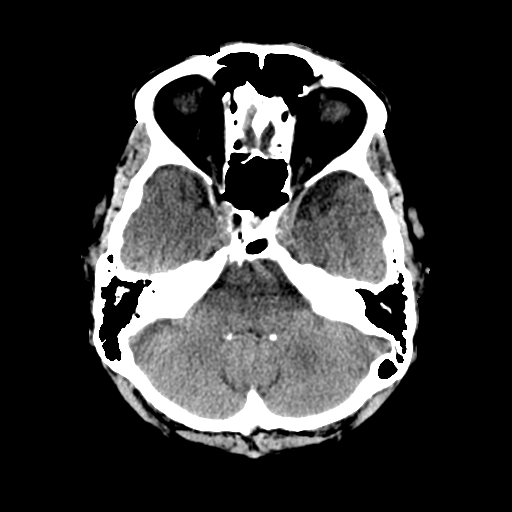
[im 9/31  brain]
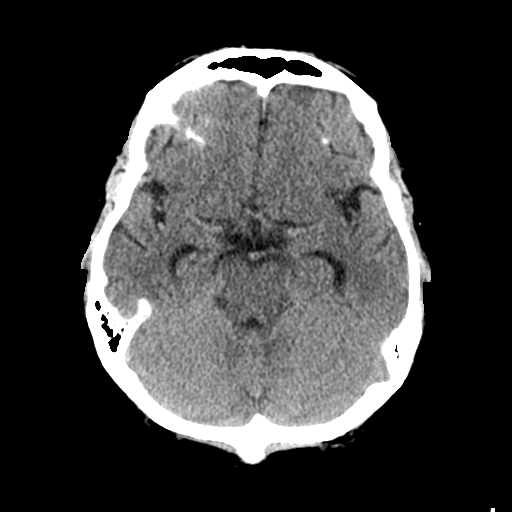
[im 11/31  brain]
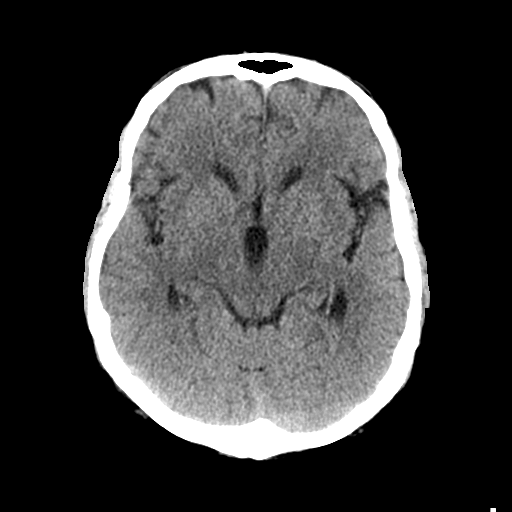
[im 14/31  brain]
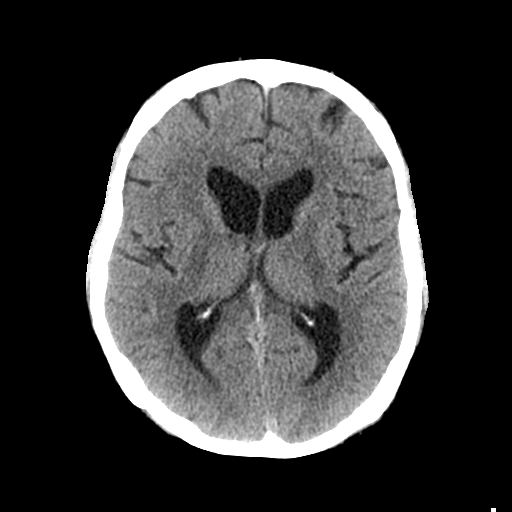
[im 14/31  bone]
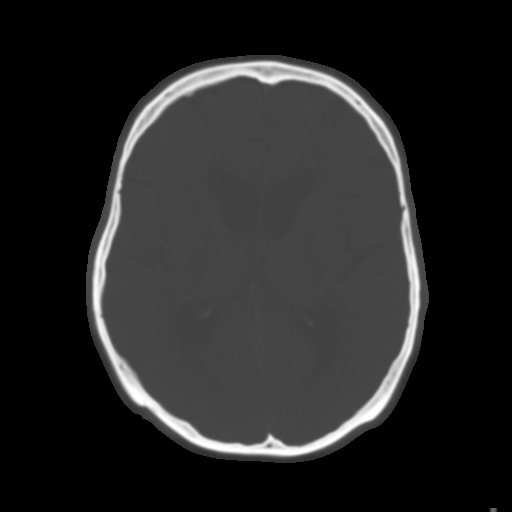
[im 17/31  brain]
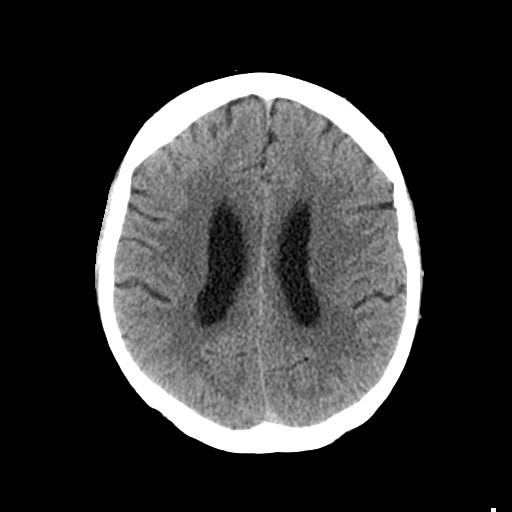
[im 20/31  brain]
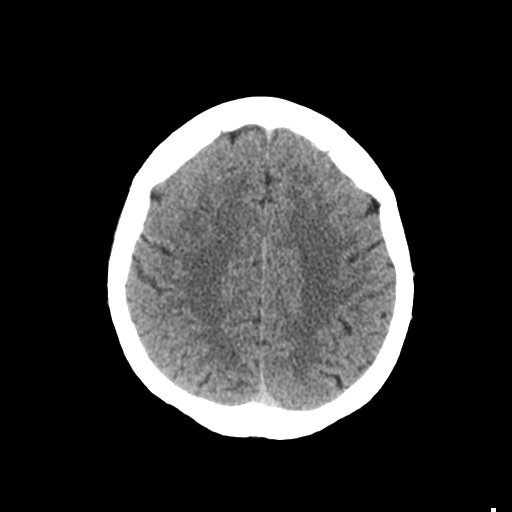
[im 23/31  brain]
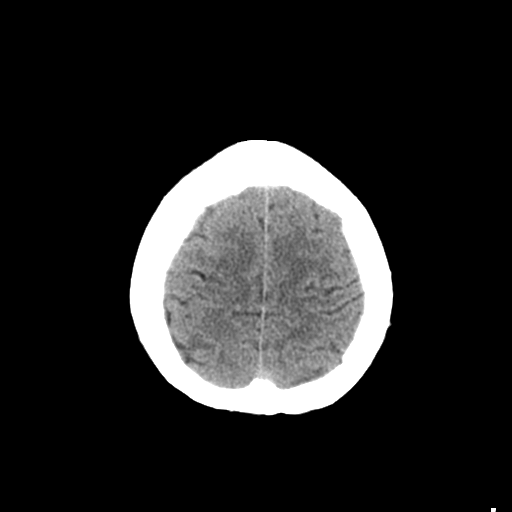
[im 25/31  brain]
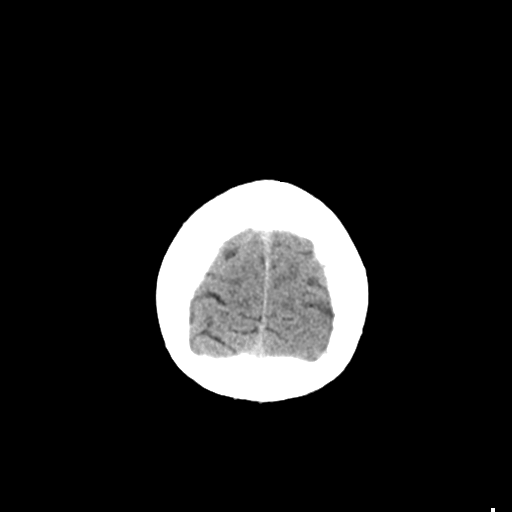
[im 25/31  bone]
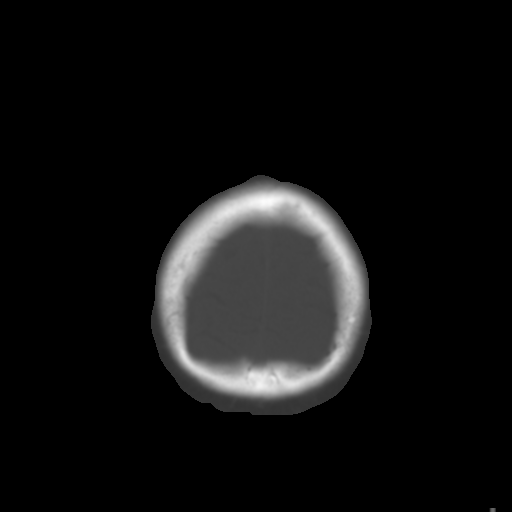
[im 28/31  brain]
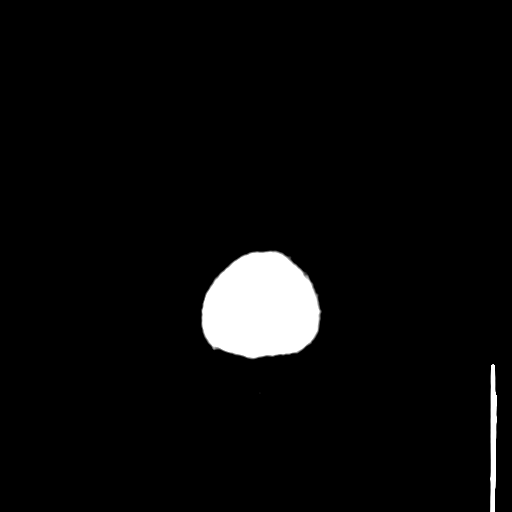

[Series 4: coronal soft tissue · coronal · 0.33mm/px · 3 of 67 slices shown]
[im 23/67  brain]
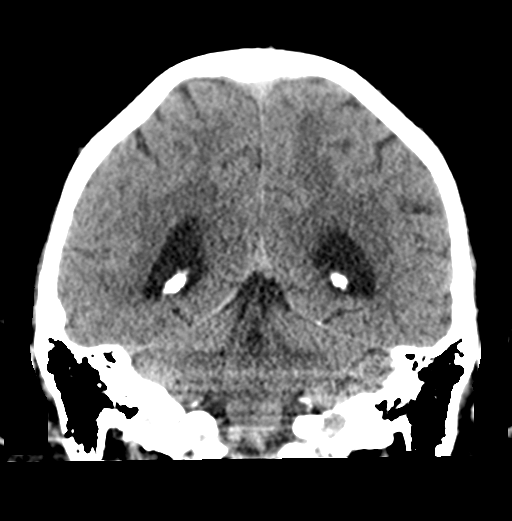
[im 30/67  brain]
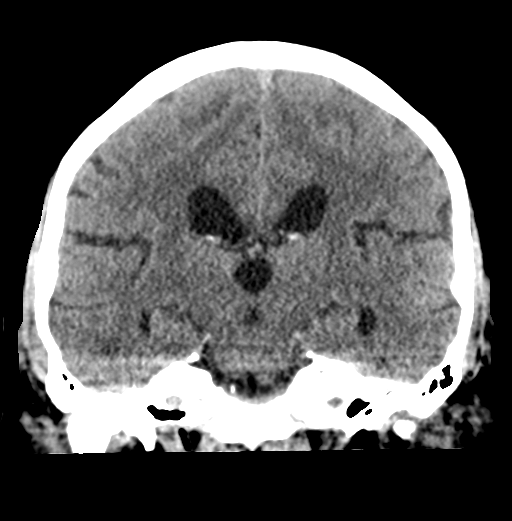
[im 37/67  brain]
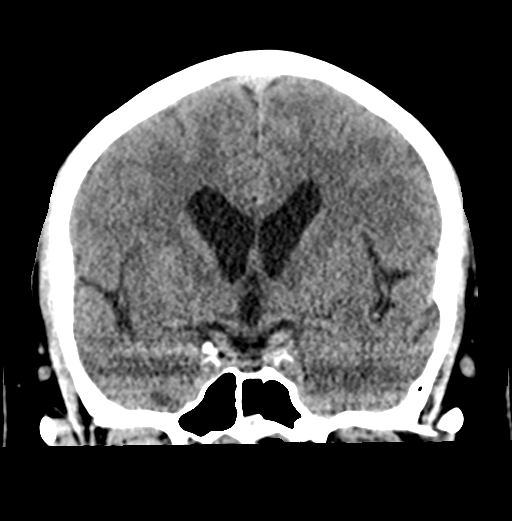

[Series 5: sagittal soft tissue · sagittal · 0.30mm/px · 3 of 57 slices shown]
[im 19/57  brain]
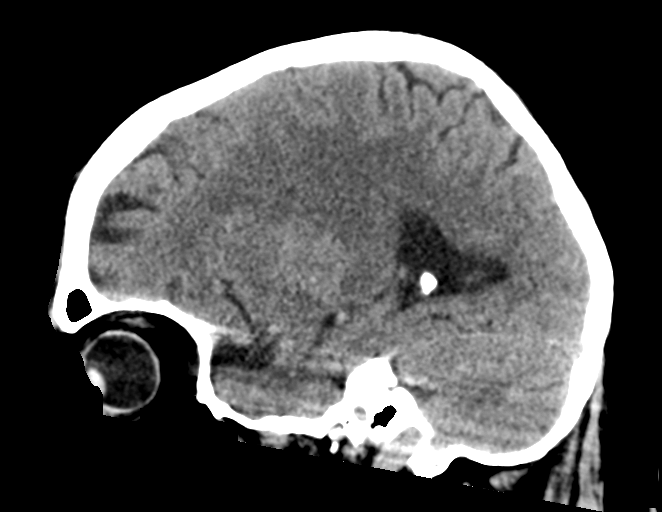
[im 29/57  brain]
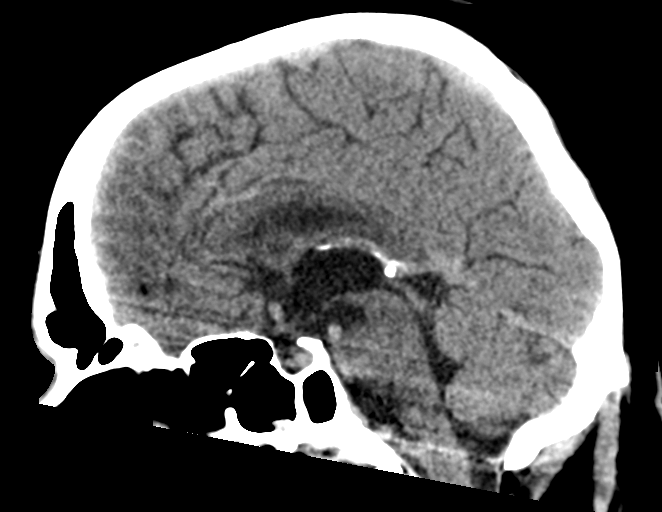
[im 38/57  brain]
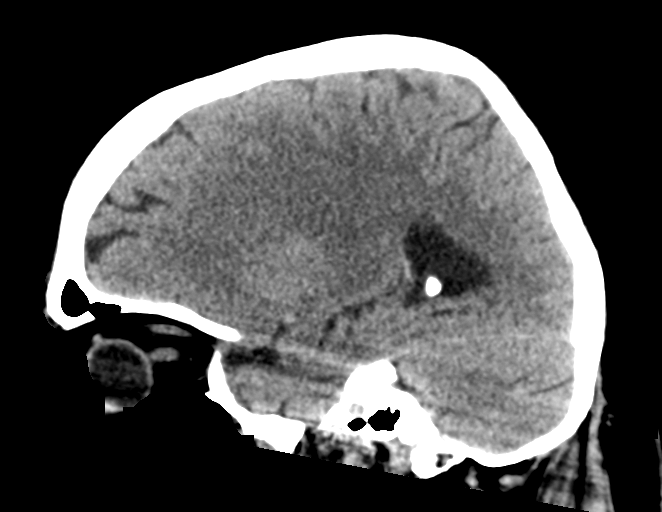

[16 of 47 positions shown; findings below may reference images not displayed]

FINDINGS: Brain: Mild chronic ischemic white matter disease. No mass effect or
midline shift is noted. Ventricular size is within normal limits.
There is no evidence of mass lesion, hemorrhage or acute infarction.

Vascular: No hyperdense vessel or unexpected calcification.

Skull: Normal. Negative for fracture or focal lesion.

Sinuses/Orbits: No acute finding.

Other: None.
IMPRESSION: Mild chronic ischemic white matter disease. No acute intracranial
abnormality seen.

## 2018-11-05 IMAGING — DX DG LUMBAR SPINE COMPLETE 4+V
5 series · 5 of 5 positions shown · non-contrast
Comparison: None.

CLINICAL DATA: Recent fall, pain in the low back and posterior
right hip

EXAM:
LUMBAR SPINE - COMPLETE 4+ VIEW

[l-spine ap]
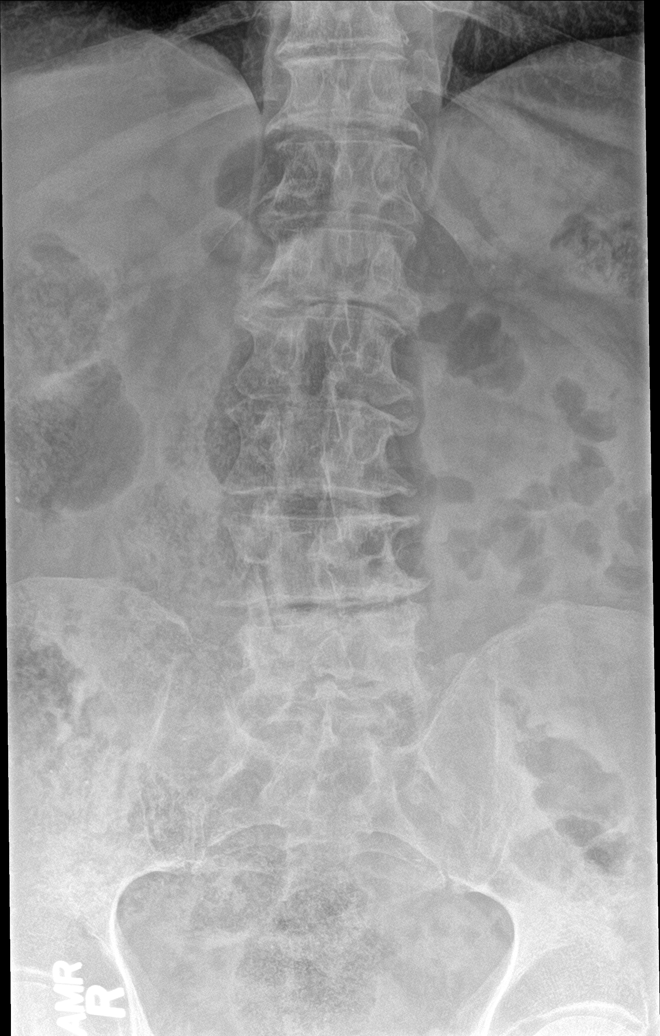

[l-spine obl (1 of 2)]
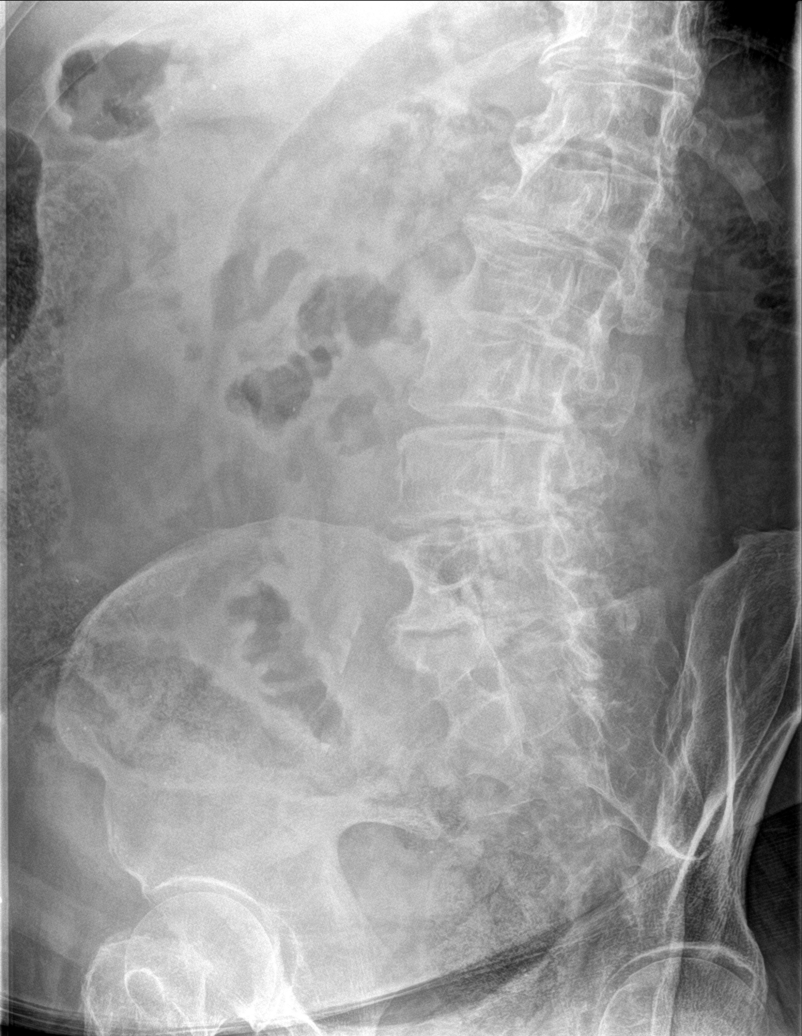

[l-spine obl (2 of 2)]
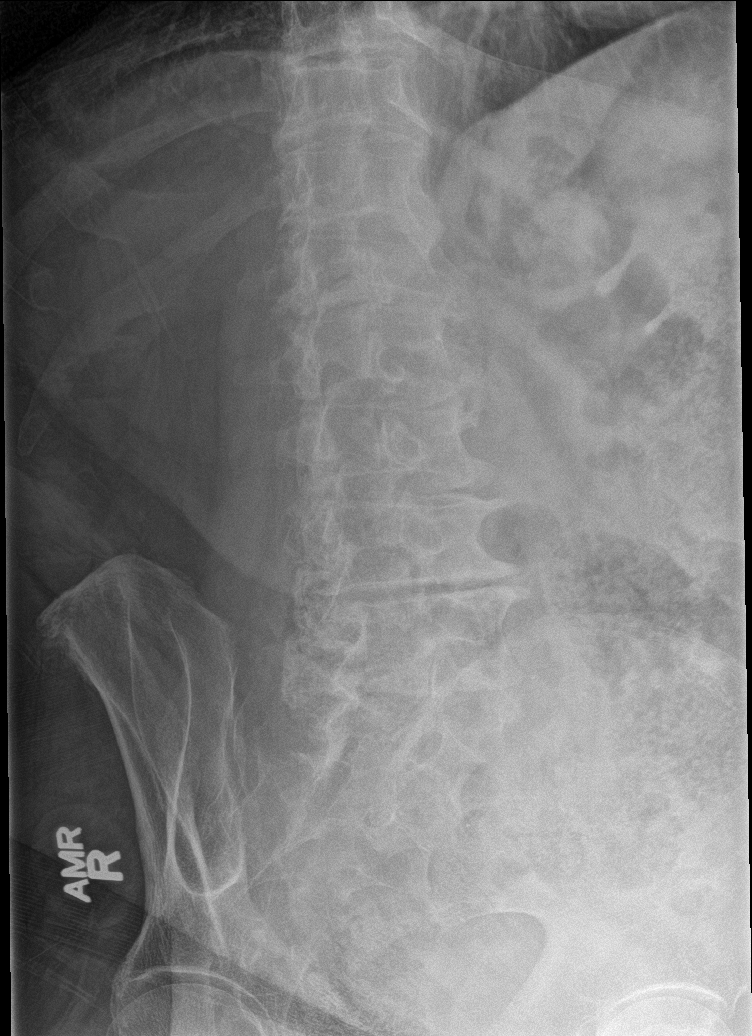

[l-spine lat]
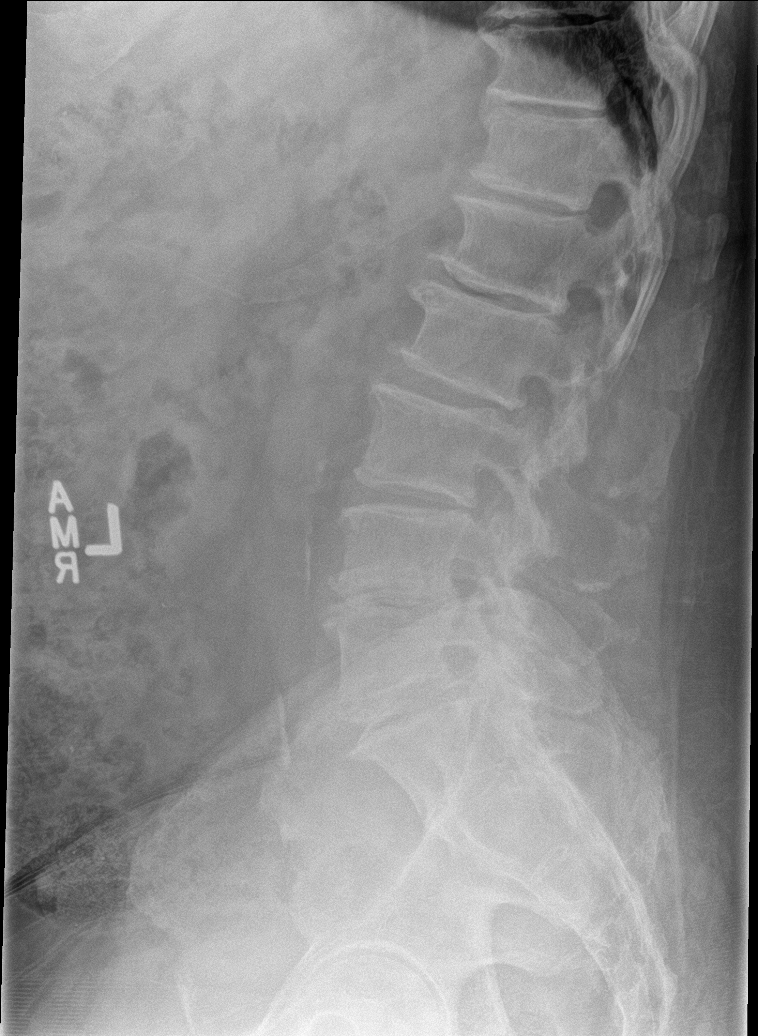

[l-spine spot]
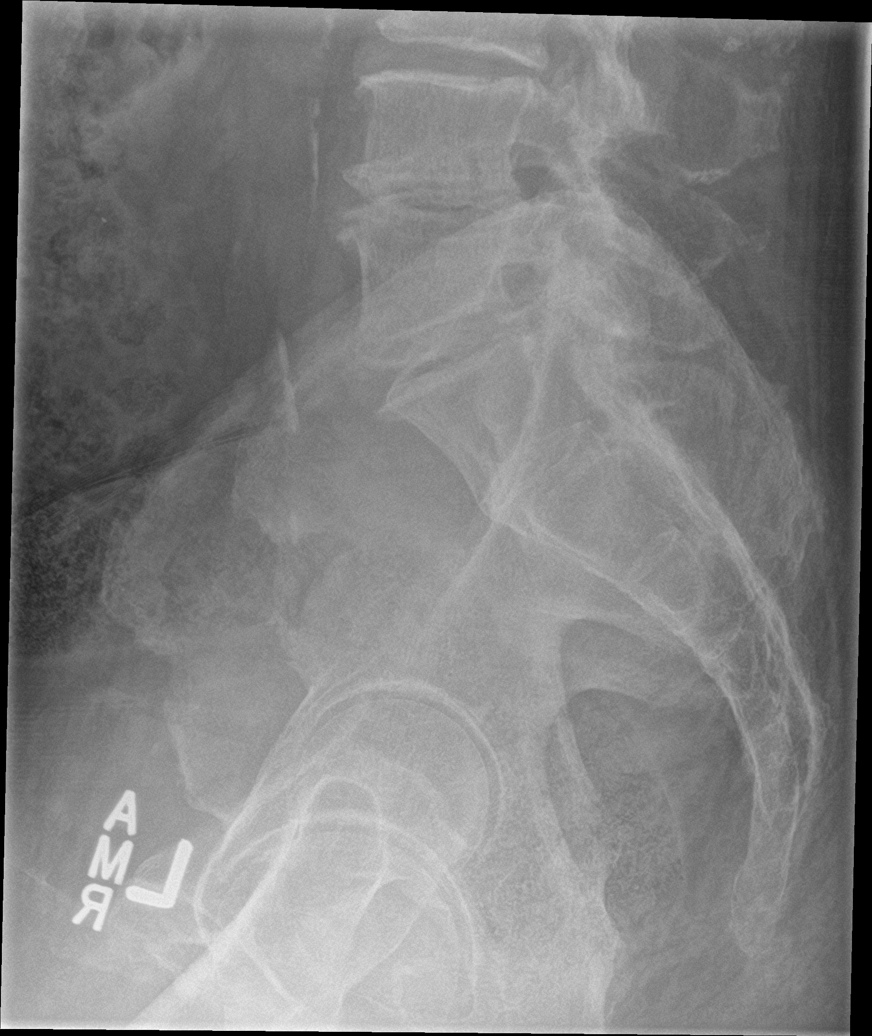

[5 of 5 positions shown; findings below may reference images not displayed]

FINDINGS: On the lateral view the lumbar vertebrae are within normal
alignment. There is degenerative disc disease diffusely particularly
L1-2, L4-5, and L5-S1 levels. No compression deformity is seen. On
the frontal view there is mild curvature of the lumbar spine convex
to the right by approximately 5 degrees. Degenerative changes are
noted in the facet joints particularly of L4-5 and L5-S1. The SI
joints appear corticated.
IMPRESSION: 1. Diffuse degenerative change with degenerative disc disease
particularly at L1-2, L4-5, and L5-S1.
2. No acute compression deformity.

## 2018-11-05 MED ORDER — ACETAMINOPHEN 325 MG PO TABS
650.0000 mg | ORAL_TABLET | Freq: Once | ORAL | Status: AC
  Administered 2018-11-05: 650 mg via ORAL
  Filled 2018-11-05: qty 2

## 2018-11-05 NOTE — ED Notes (Addendum)
Pt is complaining of lower back pain. NAD. Have taken off wet clothes and put on a dry gown. Pt is not on blood thinners
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
## 2018-11-05 NOTE — ED Notes (Signed)
Patient transported to X-ray 
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
## 2018-11-07 ENCOUNTER — Ambulatory Visit: Payer: Medicare Other | Admitting: Family Medicine

## 2018-11-11 ENCOUNTER — Ambulatory Visit (INDEPENDENT_AMBULATORY_CARE_PROVIDER_SITE_OTHER): Payer: Medicare Other | Admitting: Family Medicine

## 2018-11-11 ENCOUNTER — Encounter: Payer: Self-pay | Admitting: Family Medicine

## 2018-11-11 VITALS — BP 124/74 | HR 75 | Temp 97.0°F | Ht 70.0 in | Wt 254.2 lb

## 2018-11-11 DIAGNOSIS — F0781 Postconcussional syndrome: Secondary | ICD-10-CM

## 2018-11-11 NOTE — Progress Notes (Signed)
BP 124/74   Pulse 75   Temp (!) 97 F (36.1 C) (Oral)   Ht 5\' 10"  (1.778 m)   Wt 254 lb 3.2 oz (115.3 kg)   BMI 36.47 kg/m    Subjective:    [name], male    DOB: 1946/05/22, 72 y.o.   MRN: [id number]  HPI: Craig Hurley is a 72 y.o. male presenting on 11/11/2018 for ER follow up (12/10 Fall- AP) and Back Pain   HPI Back pain and neck pain and headache Patient is coming in for an ER follow-up after a fall and pain in his head and his neck.  He sustained a fall on 11/05/2018 when he fell back and landed on the back of his head.  Patient's pain and headache has much improved and is still continuing to gradually improve.  He is only having the headaches every now and then and mainly has pain on both sides and the back of his neck and is not having any numbness or weakness or nausea or vomiting.  Relevant past medical, surgical, family and social history reviewed and updated as indicated. Interim medical history since our last visit reviewed. Allergies and medications reviewed and updated.  Review of Systems  Constitutional: Negative for chills and fever.  Eyes: Negative for visual disturbance.  Respiratory: Negative for shortness of breath and wheezing.   Cardiovascular: Negative for chest pain and leg swelling.  Musculoskeletal: Positive for arthralgias, back pain and myalgias. Negative for gait problem.  Skin: Negative for rash.  Neurological: Positive for headaches. Negative for dizziness, weakness and numbness.  All other systems reviewed and are negative.   Per HPI unless specifically indicated above   Allergies as of 11/11/2018      Reactions   Penicillins Swelling      Medication List       Accurate as of November 11, 2018  9:34 AM. Always use your most recent med list.        acetaminophen 325 MG tablet Commonly known as:  TYLENOL Take 650 mg by mouth every 6 (six) hours as needed.   albuterol 108 (90 Base) MCG/ACT  inhaler Commonly known as:  PROVENTIL HFA;VENTOLIN HFA Inhale 2 puffs into the lungs every 4 (four) hours as needed for wheezing or shortness of breath.   cetirizine 10 MG tablet Commonly known as:  ZYRTEC Take 1 tablet (10 mg total) by mouth daily.   doxazosin 4 MG tablet Commonly known as:  CARDURA Take 1 tablet (4 mg total) by mouth daily.   EYE VITAMINS PO Take by mouth.   fluticasone 50 MCG/ACT nasal spray Commonly known as:  FLONASE Place 2 sprays into both nostrils daily as needed for allergies or rhinitis.   Fluticasone-Salmeterol 250-50 MCG/DOSE Aepb Commonly known as:  ADVAIR DISKUS Inhale 1 puff into the lungs 2 (two) times daily.   meloxicam 15 MG tablet Commonly known as:  MOBIC Take 1 tablet (15 mg total) by mouth daily.   montelukast 10 MG tablet Commonly known as:  SINGULAIR Take 1 tablet (10 mg total) by mouth at bedtime.   omeprazole 20 MG capsule Commonly known as:  PRILOSEC Take 1 capsule (20 mg total) by mouth 2 (two) times daily before a meal.   polyethylene glycol powder powder Commonly known as:  GLYCOLAX/MIRALAX Take 17 g by mouth daily as needed.   TURMERIC CURCUMIN PO Take 1 Dose by mouth 2 (two) times daily.   umeclidinium bromide 62.5 MCG/INH Aepb Commonly known  as:  INCRUSE ELLIPTA Inhale 1 puff into the lungs daily.   vitamin A 8000 UNIT capsule Take 8,000 Units by mouth daily.         Objective:    BP 124/74   Pulse 75   Temp (!) 97 F (36.1 C) (Oral)   Ht 5\' 10"  (1.778 m)   Wt 254 lb 3.2 oz (115.3 kg)   BMI 36.47 kg/m   Wt Readings from Last 3 Encounters:  11/11/18 254 lb 3.2 oz (115.3 kg)  11/05/18 249 lb (112.9 kg)  10/30/18 249 lb 3.2 oz (113 kg)    Physical Exam Vitals signs and nursing note reviewed.  Constitutional:      General: He is not in acute distress.    Appearance: He is well-developed. He is not diaphoretic.  Eyes:     General: No scleral icterus.       Right eye: No discharge.        Left eye:  No discharge.     Extraocular Movements: Extraocular movements intact.     Conjunctiva/sclera: Conjunctivae normal.     Pupils: Pupils are equal, round, and reactive to light.  Neck:     Musculoskeletal: Neck supple. Normal range of motion. No neck rigidity or muscular tenderness.     Thyroid: No thyromegaly.  Cardiovascular:     Rate and Rhythm: Normal rate and regular rhythm.     Heart sounds: Normal heart sounds. No murmur.  Pulmonary:     Effort: Pulmonary effort is normal. No respiratory distress.     Breath sounds: Normal breath sounds. No wheezing.  Musculoskeletal: Normal range of motion.     Lumbar back: He exhibits normal range of motion, no tenderness (negative straight leg raise), no bony tenderness, no deformity and no spasm.  Lymphadenopathy:     Cervical: No cervical adenopathy.  Skin:    General: Skin is warm and dry.     Findings: No rash.  Neurological:     Mental Status: He is alert and oriented to person, place, and time.     Coordination: Coordination normal.  Psychiatric:        Behavior: Behavior normal.        Assessment & Plan:   Problem List Items Addressed This Visit    None    Visit Diagnoses    Postconcussion syndrome    -  Primary      Patient is having postconcussion syndrome but it seems like everything is improving, we will continue to monitor at this point and will watch for any red flag signs such as nausea or vomiting and worsening or increased headache.  Follow up plan: Return if symptoms worsen or fail to improve.  Counseling provided for all of the vaccine components No orders of the defined types were placed in this encounter.   Caryl Pina, MD [facility] 11/11/2018, 9:34 AM

## 2018-11-13 DIAGNOSIS — H2512 Age-related nuclear cataract, left eye: Secondary | ICD-10-CM | POA: Diagnosis not present

## 2018-11-13 DIAGNOSIS — H25012 Cortical age-related cataract, left eye: Secondary | ICD-10-CM | POA: Diagnosis not present

## 2018-11-26 DIAGNOSIS — H2512 Age-related nuclear cataract, left eye: Secondary | ICD-10-CM | POA: Diagnosis not present

## 2018-11-26 DIAGNOSIS — H25812 Combined forms of age-related cataract, left eye: Secondary | ICD-10-CM | POA: Diagnosis not present

## 2018-12-08 ENCOUNTER — Other Ambulatory Visit: Payer: Self-pay | Admitting: Family Medicine

## 2018-12-08 DIAGNOSIS — K219 Gastro-esophageal reflux disease without esophagitis: Secondary | ICD-10-CM

## 2018-12-24 ENCOUNTER — Encounter: Payer: Self-pay | Admitting: Nurse Practitioner

## 2018-12-24 ENCOUNTER — Ambulatory Visit (INDEPENDENT_AMBULATORY_CARE_PROVIDER_SITE_OTHER): Payer: Medicare Other | Admitting: Nurse Practitioner

## 2018-12-24 VITALS — BP 123/68 | HR 80 | Temp 97.1°F | Ht 70.0 in | Wt 244.0 lb

## 2018-12-24 DIAGNOSIS — J069 Acute upper respiratory infection, unspecified: Secondary | ICD-10-CM

## 2018-12-24 MED ORDER — PREDNISONE 20 MG PO TABS: ORAL_TABLET | ORAL | 0 refills | Status: DC

## 2018-12-24 MED ORDER — BENZONATATE 100 MG PO CAPS: 100.0000 mg | ORAL_CAPSULE | Freq: Three times a day (TID) | ORAL | 0 refills | Status: DC | PRN

## 2018-12-24 NOTE — Patient Instructions (Signed)

## 2018-12-24 NOTE — Progress Notes (Signed)
Subjective:    [name], male    DOB: Oct 21, 1946, 73 y.o.   MRN: [id number]   Chief Complaint: Cough   HPI Patient comes in this afternoon c/o cough and congestion for over 2 weeks. Feels bad. Cough is productive.   Review of Systems  Constitutional: Positive for appetite change (decreased). Negative for chills and fever.  HENT: Positive for congestion and rhinorrhea. Negative for sinus pressure, sore throat and trouble swallowing.   Respiratory: Positive for cough (productive at times). Negative for shortness of breath.   Genitourinary: Negative.   Neurological: Positive for headaches.  Psychiatric/Behavioral: Negative.   All other systems reviewed and are negative.      Objective:   Physical Exam Vitals signs and nursing note reviewed.  Constitutional:      General: He is not in acute distress.    Appearance: He is normal weight.  HENT:     Right Ear: Hearing, tympanic membrane, ear canal and external ear normal.     Left Ear: Hearing, tympanic membrane, ear canal and external ear normal.     Nose: Mucosal edema, congestion and rhinorrhea present.     Right Sinus: No maxillary sinus tenderness or frontal sinus tenderness.     Left Sinus: No maxillary sinus tenderness or frontal sinus tenderness.     Mouth/Throat:     Lips: Pink.     Pharynx: No posterior oropharyngeal erythema or uvula swelling.  Neck:     Musculoskeletal: Normal range of motion.  Cardiovascular:     Rate and Rhythm: Normal rate and regular rhythm.     Heart sounds: Normal heart sounds.  Pulmonary:     Effort: Pulmonary effort is normal.     Breath sounds: Normal breath sounds. No wheezing or rales.     Comments: Deep wet cough Skin:    General: Skin is warm and dry.  Neurological:     General: No focal deficit present.     Mental Status: He is oriented to person, place, and time.  Psychiatric:        Mood and Affect: Mood normal.        Behavior: Behavior normal.      BP 123/68   Pulse 80   Temp (!) 97.1 F (36.2 C) (Oral)   Ht 5\' 10"  (1.778 m)   Wt 244 lb (110.7 kg)   BMI 35.01 kg/m       Assessment & Plan:  Craig Hurley in today with chief complaint of Cough   1. Upper respiratory infection with cough and congestion 1. Take meds as prescribed 2. Use a cool mist humidifier especially during the winter months and when heat has been humid. 3. Use saline nose sprays frequently 4. Saline irrigations of the nose can be very helpful if done frequently.  * 4X daily for 1 week*  * Use of a nettie pot can be helpful with this. Follow directions with this* 5. Drink plenty of fluids 6. Keep thermostat turn down low 7.For any cough or congestion  Use plain Mucinex- regular strength or max strength is fine   * Children- consult with Pharmacist for dosing 8. For fever or aces or pains- take tylenol or ibuprofen appropriate for age and weight.  * for fevers greater than 101 orally you may alternate ibuprofen and tylenol every  3 hours.    - predniSONE (DELTASONE) 20 MG tablet; 2 po at sametime daily for 5 days  Dispense: 10 tablet; Refill: 0 - benzonatate (  TESSALON PERLES) 100 MG capsule; Take 1 capsule (100 mg total) by mouth 3 (three) times daily as needed for cough.  Dispense: 20 capsule; Refill: 0  Mary-Margaret Hassell Done, FNP

## 2019-01-03 ENCOUNTER — Ambulatory Visit (INDEPENDENT_AMBULATORY_CARE_PROVIDER_SITE_OTHER): Payer: Medicare Other

## 2019-01-03 ENCOUNTER — Encounter: Payer: Self-pay | Admitting: Family Medicine

## 2019-01-03 ENCOUNTER — Ambulatory Visit (INDEPENDENT_AMBULATORY_CARE_PROVIDER_SITE_OTHER): Payer: Medicare Other | Admitting: Family Medicine

## 2019-01-03 VITALS — BP 126/69 | HR 105 | Temp 97.8°F | Ht 70.0 in | Wt 240.0 lb

## 2019-01-03 DIAGNOSIS — R918 Other nonspecific abnormal finding of lung field: Secondary | ICD-10-CM

## 2019-01-03 DIAGNOSIS — R05 Cough: Secondary | ICD-10-CM | POA: Diagnosis not present

## 2019-01-03 DIAGNOSIS — J441 Chronic obstructive pulmonary disease with (acute) exacerbation: Secondary | ICD-10-CM | POA: Diagnosis not present

## 2019-01-03 DIAGNOSIS — R059 Cough, unspecified: Secondary | ICD-10-CM

## 2019-01-03 IMAGING — DX DG CHEST 2V
2 series · 2 of 2 positions shown · non-contrast
Comparison: None.

CLINICAL DATA: Persistent cough for several months.

EXAM:
CHEST - 2 VIEW

[chest pa]
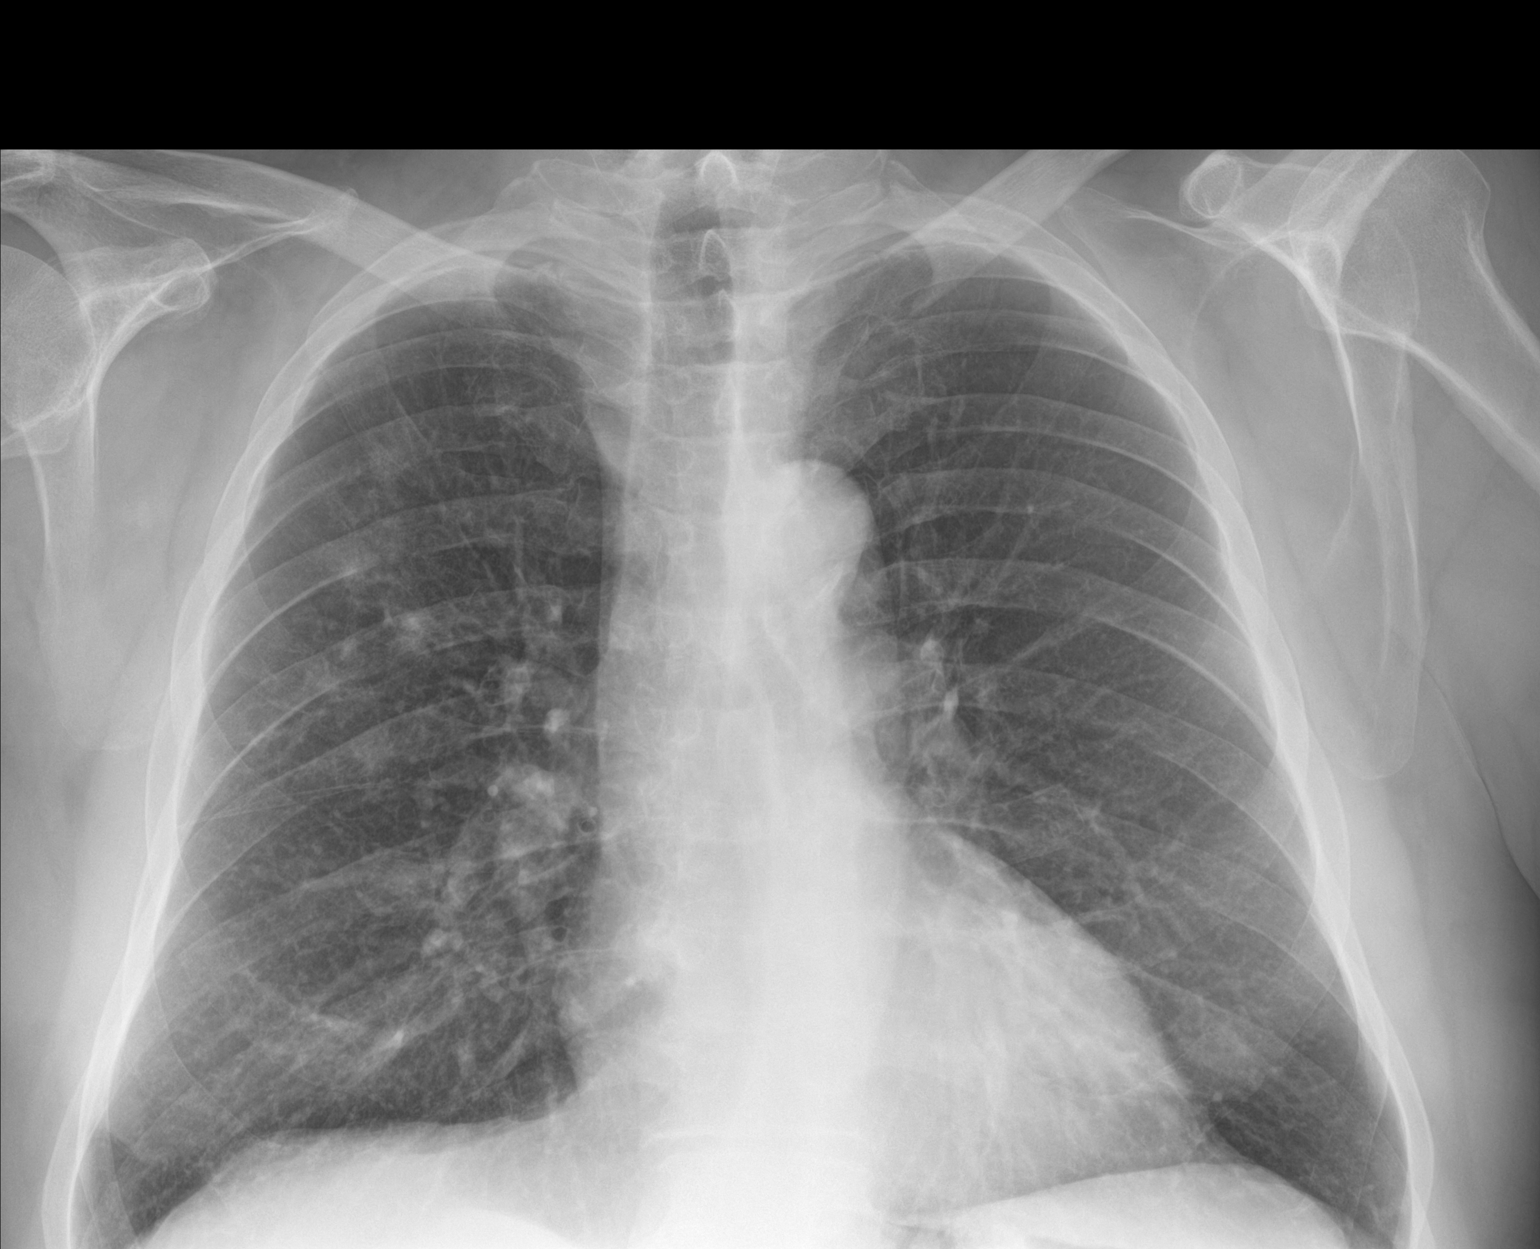

[chest lat]
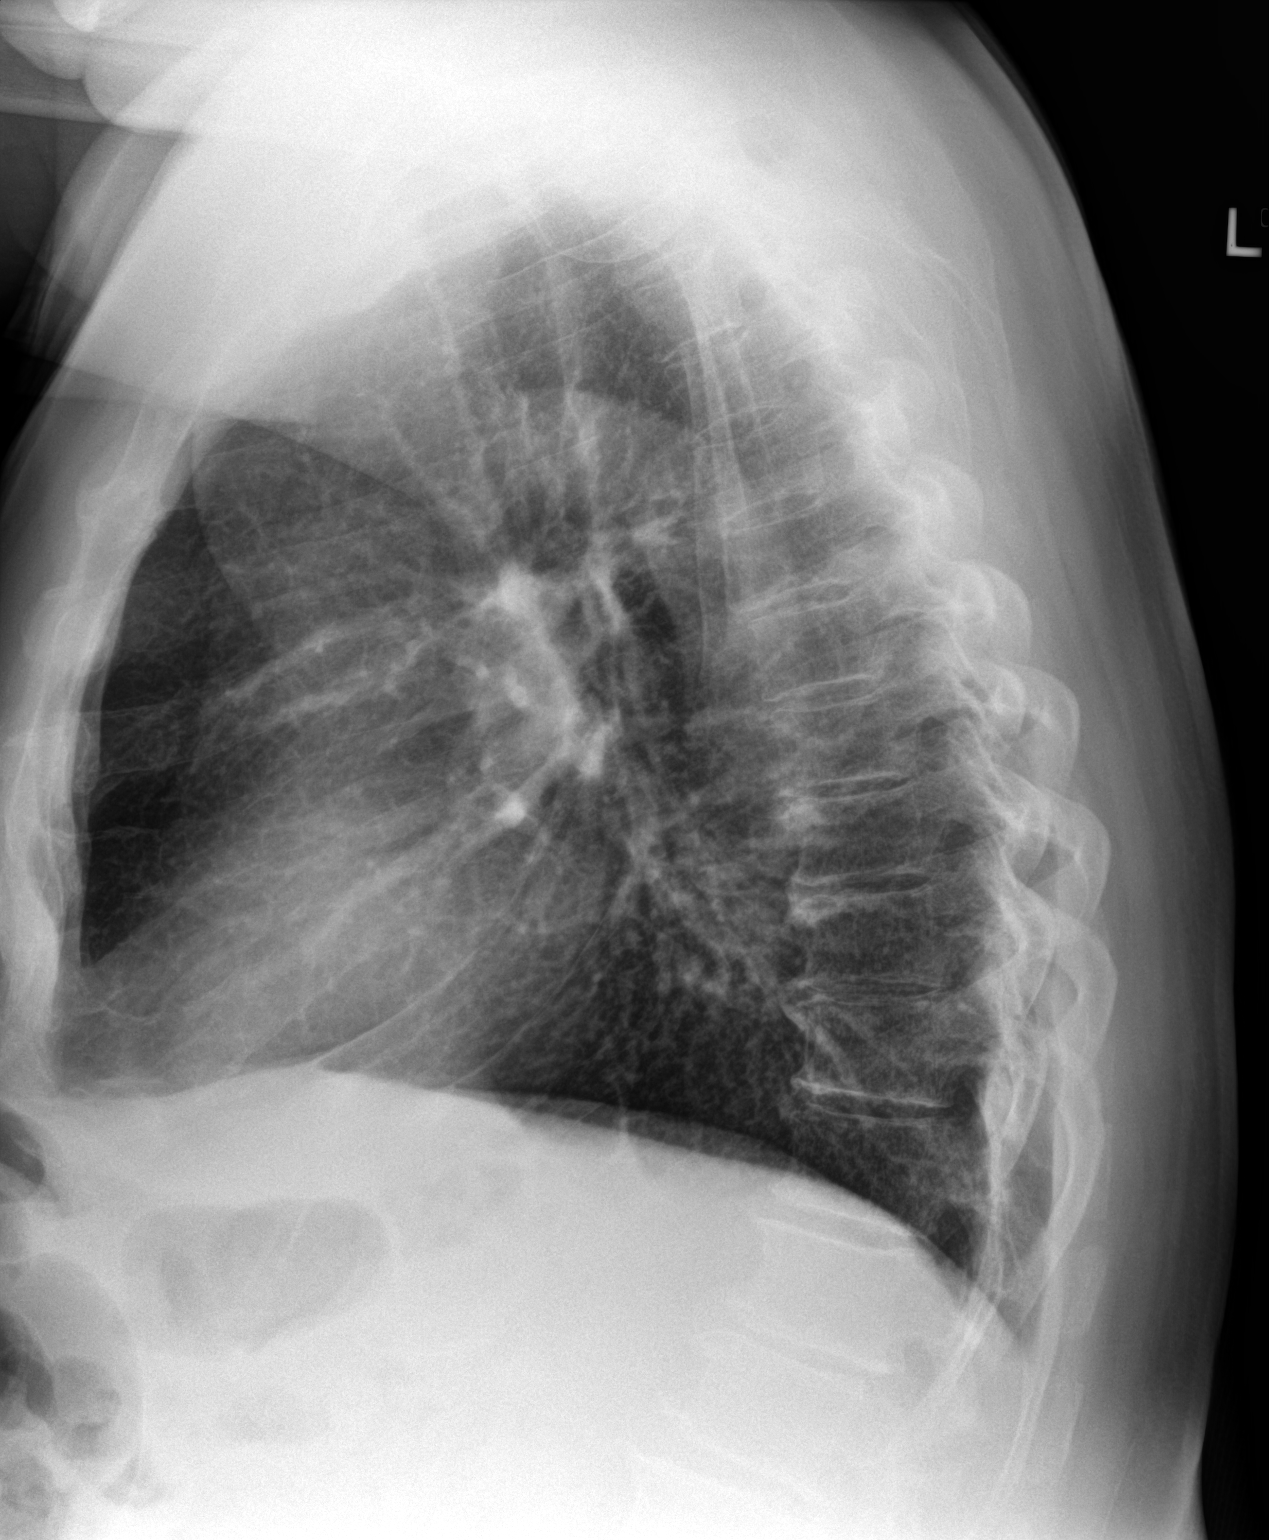

[2 of 2 positions shown; findings below may reference images not displayed]

FINDINGS: The heart size and mediastinal contours are within normal limits.
Small subcentimeter nodularities are identified in the right upper
lobe. The left lung is clear. There is no pleural effusion.
Degenerative joint changes of the spine are noted.
IMPRESSION: Several small subcentimeter nodularities are identified in the right
upper lobe. Further evaluation with a chest CT is recommended.

## 2019-01-03 MED ORDER — PREDNISONE 20 MG PO TABS: ORAL_TABLET | ORAL | 0 refills | Status: DC

## 2019-01-03 MED ORDER — LEVOFLOXACIN 500 MG PO TABS: 500.0000 mg | ORAL_TABLET | Freq: Every day | ORAL | 0 refills | Status: AC

## 2019-01-03 NOTE — Patient Instructions (Signed)

## 2019-01-03 NOTE — Progress Notes (Addendum)
    Subjective:     Craig Hurley is a 73 y.o. male who presents for evaluation of symptoms of a URI and increasing cough. Symptoms include congestion, cough described as productive and worsening over time, low grade fever, nasal congestion and productive cough with  yellow, green, brown and tenacious colored sputum. Onset of symptoms was several weeks ago, and has been gradually worsening since that time. He has increased shortness of breath with exertion, cough, and sputum production. He has been fighting this off and on for several months and was recently treated with Zithromax. Treatment to date: cough suppressants and decongestants.  The following portions of the patient's history were reviewed and updated as appropriate: allergies, current medications, past family history, past medical history, past social history, past surgical history and problem list.  Review of Systems Constitutional: positive for chills, fatigue and fevers Eyes: negative Ears, nose, mouth, throat, and face: positive for nasal congestion Respiratory: positive for cough, dyspnea on exertion and sputum Cardiovascular: negative Musculoskeletal:positive for myalgias Neurological: positive for headaches   Objective:    BP 126/69   Pulse (!) 105   Temp 97.8 F (36.6 C) (Oral)   Ht 5\' 10"  (1.778 m)   Wt 240 lb (108.9 kg)   SpO2 96%   BMI 34.44 kg/m  General appearance: alert, cooperative, moderate distress and moderately obese Head: Normocephalic, without obvious abnormality, atraumatic Eyes: negative Ears: normal TM's and external ear canals both ears Nose: mild congestion, no sinus tenderness Throat: lips, mucosa, and tongue normal; teeth and gums normal Neck: no adenopathy, no carotid bruit, no JVD, supple, symmetrical, trachea midline and thyroid not enlarged, symmetric, no tenderness/mass/nodules Lungs: diminished breath sounds LLL, rhonchi LLL and RLL and wheezes bilaterally Heart: regular rate and  rhythm, S1, S2 normal, no murmur, click, rub or gallop Skin: Skin color, texture, turgor normal. No rashes or lesions Neurologic: Grossly normal    X-Ray: Chest: COPD changes. Preliminary x-ray reading by Monia Pouch, FNP-C, [facility].  Assessment:     Craig Hurley was seen today for uri.  Diagnoses and all orders for this visit:  COPD with acute exacerbation ([place]) Due to ongoing and worsening symptoms, will treat with Levaquin and prednisone. Report any new or worsening symptoms. Medications as prescribed.  -     levofloxacin (LEVAQUIN) 500 MG tablet; Take 1 tablet (500 mg total) by mouth daily for 7 days. -     predniSONE (DELTASONE) 20 MG tablet; 2 po at sametime daily for 5 days  Cough Has tessalon at home, continue as prescribed.  -     DG Chest 2 View; Future      Plan:    Follow up with PCP in 2 weeks or as needed.    The above assessment and management plan was discussed with the patient. The patient verbalized understanding of and has agreed to the management plan. Patient is aware to call the clinic if symptoms fail to improve or worsen. Patient is aware when to return to the clinic for a follow-up visit. Patient educated on when it is appropriate to go to the emergency department.   Monia Pouch, FNP-C [facility] [address] [place], [place] [address] [phone]

## 2019-01-06 DIAGNOSIS — Z79899 Other long term (current) drug therapy: Secondary | ICD-10-CM | POA: Diagnosis not present

## 2019-01-06 DIAGNOSIS — B351 Tinea unguium: Secondary | ICD-10-CM | POA: Diagnosis not present

## 2019-01-20 ENCOUNTER — Ambulatory Visit: Payer: Medicare Other | Admitting: Family Medicine

## 2019-01-21 ENCOUNTER — Ambulatory Visit (HOSPITAL_COMMUNITY)
Admission: RE | Admit: 2019-01-21 | Discharge: 2019-01-21 | Disposition: A | Discharge status: Home / Self Care | Payer: Medicare Other | Source: Ambulatory Visit | Attending: Family Medicine | Admitting: Family Medicine

## 2019-01-21 DIAGNOSIS — R918 Other nonspecific abnormal finding of lung field: Secondary | ICD-10-CM | POA: Insufficient documentation

## 2019-01-21 DIAGNOSIS — R05 Cough: Secondary | ICD-10-CM

## 2019-01-21 DIAGNOSIS — R059 Cough, unspecified: Secondary | ICD-10-CM

## 2019-01-21 LAB — POCT I-STAT CREATININE: Creatinine, Ser: 1 mg/dL (ref 0.61–1.24)

## 2019-01-21 IMAGING — CT CT CHEST W/ CM
2 of 3 series · 15 of 36 positions shown, 18 images · IV contrast (omnipaque)
Comparison: Chest x-ray [DATE]

CLINICAL DATA: Cough 6 weeks. Right upper lobe nodules on recent
chest x-ray. Cough better now after taking medication and steroids.

EXAM:
CT CHEST WITH CONTRAST
TECHNIQUE: Multidetector CT imaging of the chest was performed during
intravenous contrast administration.
CONTRAST:  75mL OMNIPAQUE IOHEXOL 300 MG/ML  SOLN

[Series 2: axial st · axial · 0.76mm/px · z∈[+1306,+1554]mm · 12 of 146 slices shown, 15 images]
[im 11/146  mediastinal]
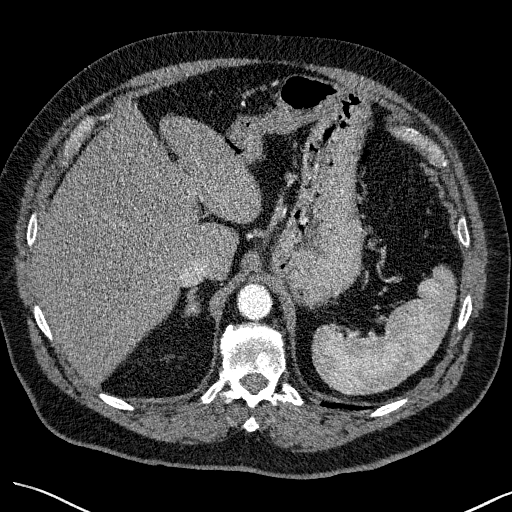
[im 11/146  lung]
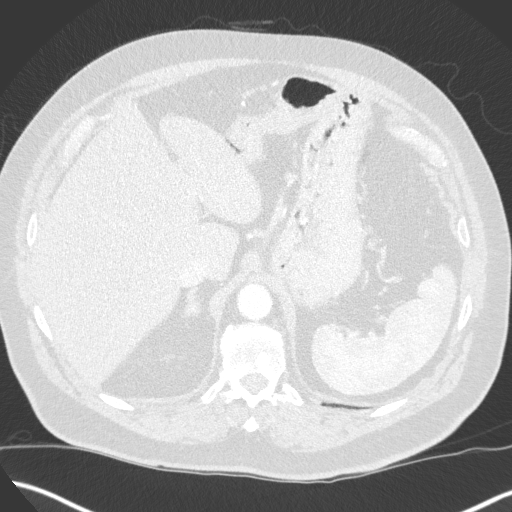
[im 22/146  lung]
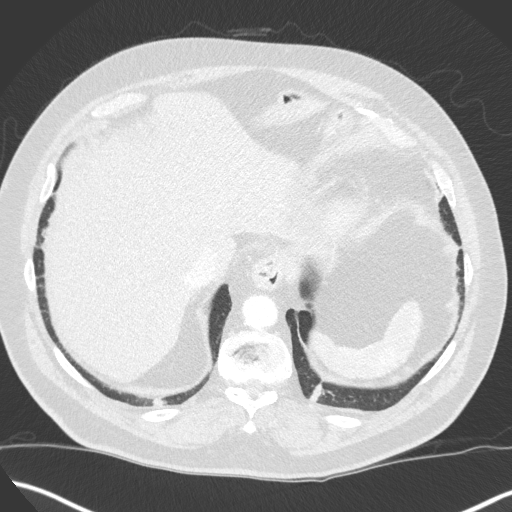
[im 33/146  lung]
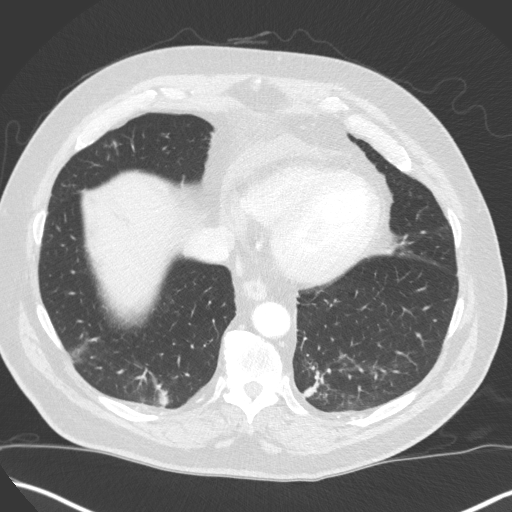
[im 43/146  lung]
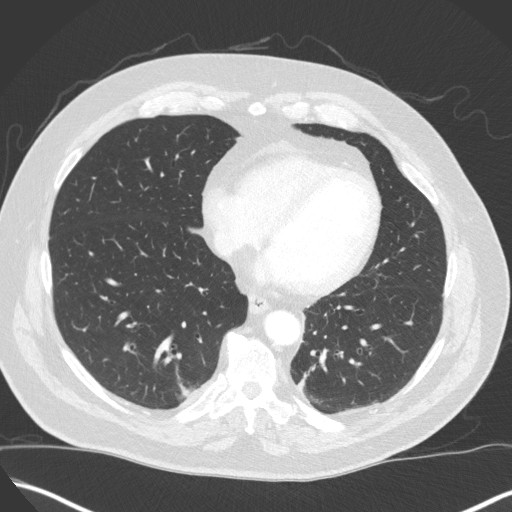
[im 54/146  mediastinal]
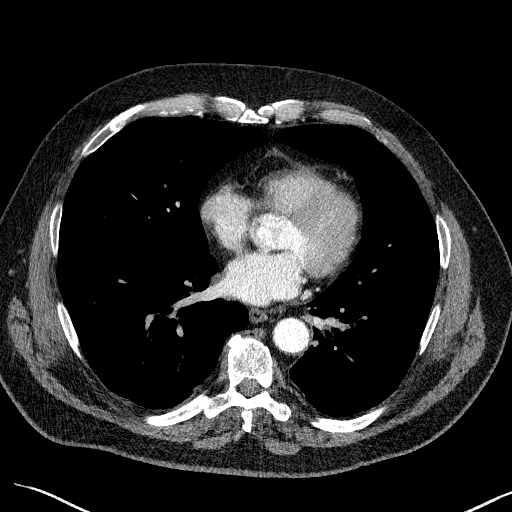
[im 54/146  lung]
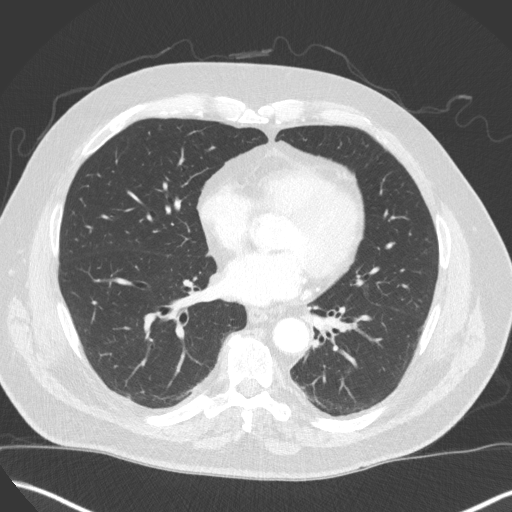
[im 65/146  lung]
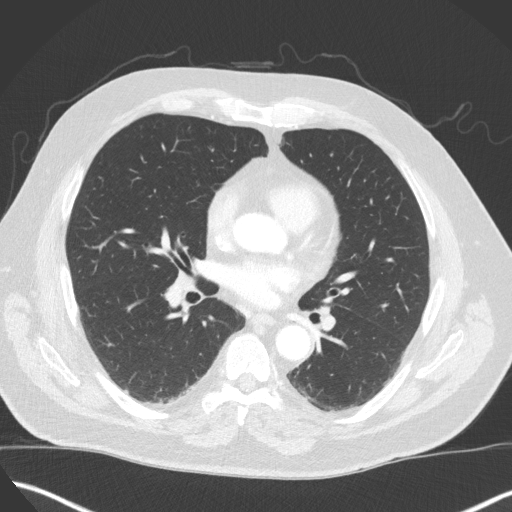
[im 81/146  lung]
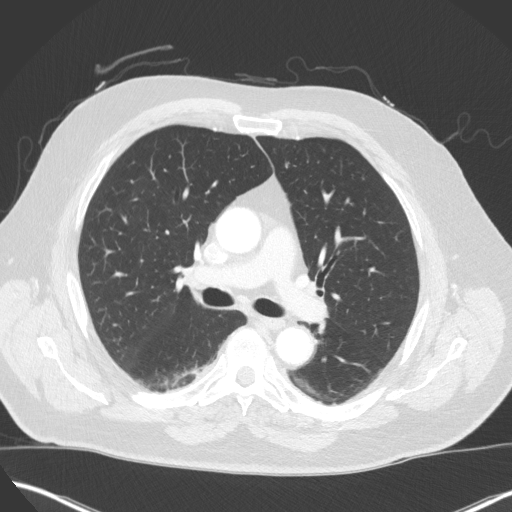
[im 92/146  lung]
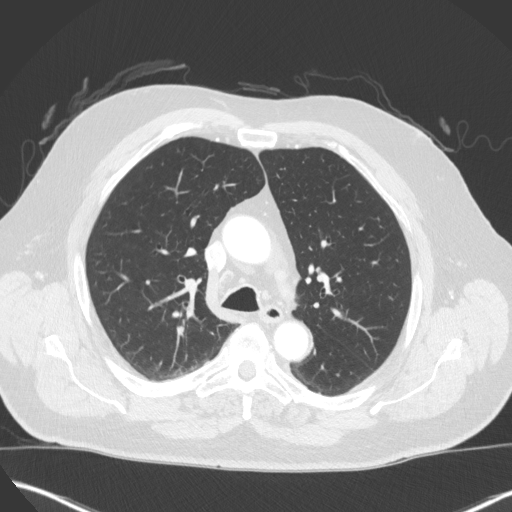
[im 103/146  mediastinal]
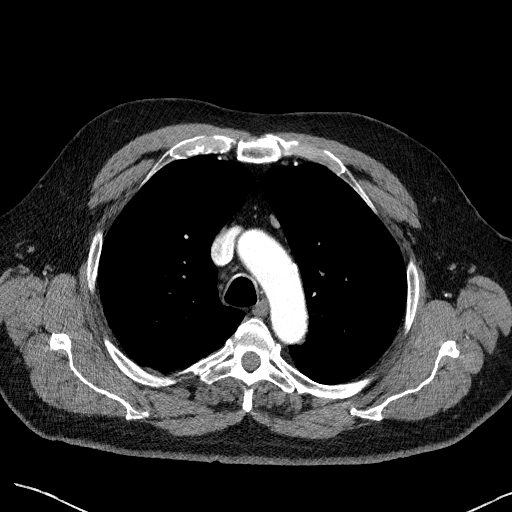
[im 103/146  lung]
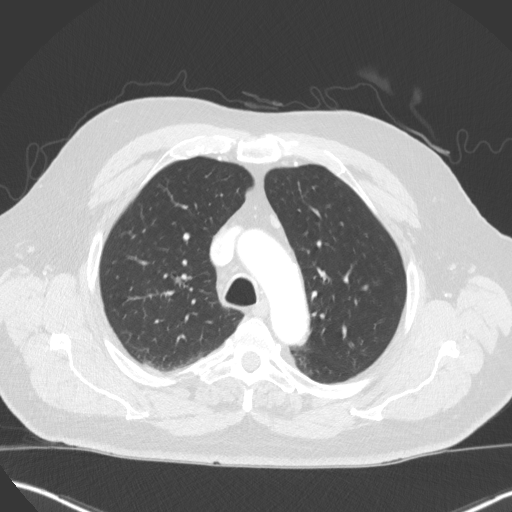
[im 113/146  lung]
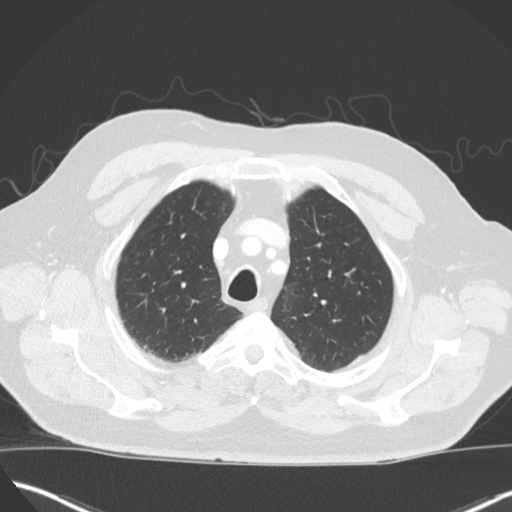
[im 124/146  lung]
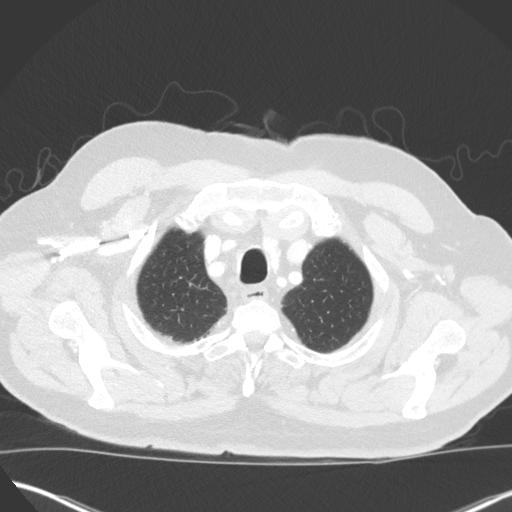
[im 135/146  lung]
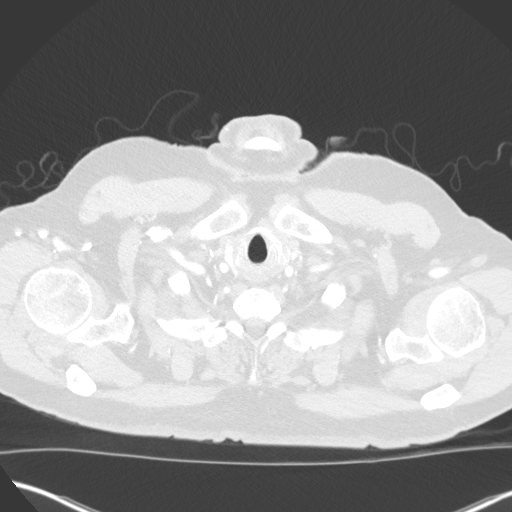

[Series 5: coronal · coronal · 0.58mm/px · 3 of 140 slices shown]
[im 28/140  lung]
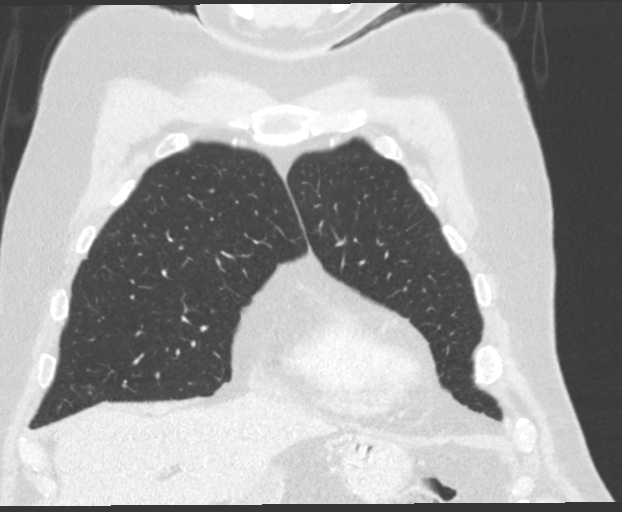
[im 56/140  lung]
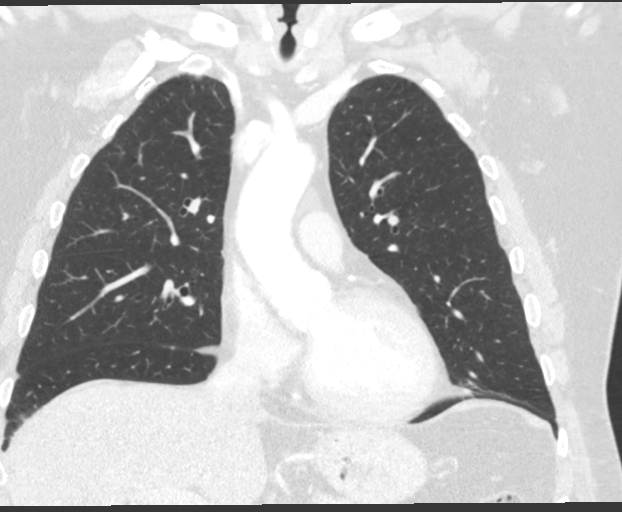
[im 84/140  lung]
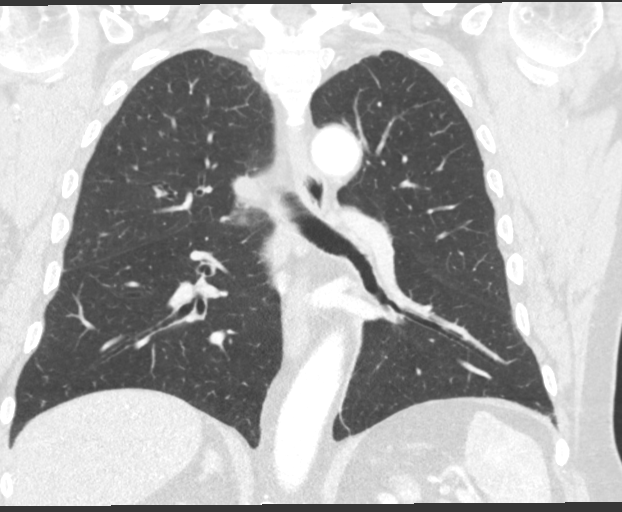

[15 of 36 positions shown; findings below may reference images not displayed]

FINDINGS: Cardiovascular: Heart is normal size. Small amount of anterior
pericardial fluid present. Thoracic aorta is normal in caliber.
Pulmonary arterial system is unremarkable.

Mediastinum/Nodes: No mediastinal or hilar adenopathy. Remaining
mediastinal structures are normal.

Lungs/Pleura: Lungs are adequately inflated with bibasilar linear
density compatible with scarring/atelectasis. 3 mm nodule over the
anterior left upper lobe. Subtle linear reticulation peripherally
over the right middle lobe. 3 mm nodular density along the left
major fissure. No focal lobar consolidation or effusion. Airways are
normal.

Upper Abdomen: No acute findings.

Musculoskeletal: Mild degenerative change of the spine.
IMPRESSION: No acute cardiopulmonary disease.

Couple tiny left lung nodules both measuring 3 mm and likely benign.
Bibasilar linear scarring/atelectasis. Recommend follow-up
noncontrast chest CT 1 year.

This recommendation follows the consensus statement: Guidelines for
Management of Small Pulmonary Nodules Detected on CT Scans: A
Statement from the [HOSPITAL] as published in Radiology
[DZ]; [DATE]. Online at:
[URL]

Small amount of pericardial fluid.

## 2019-01-21 MED ORDER — IOHEXOL 300 MG/ML  SOLN
75.0000 mL | Freq: Once | INTRAMUSCULAR | Status: AC | PRN
  Administered 2019-01-21: 75 mL via INTRAVENOUS

## 2019-01-23 ENCOUNTER — Ambulatory Visit (INDEPENDENT_AMBULATORY_CARE_PROVIDER_SITE_OTHER): Payer: Medicare Other | Admitting: Family Medicine

## 2019-01-23 ENCOUNTER — Encounter: Payer: Self-pay | Admitting: Family Medicine

## 2019-01-23 VITALS — BP 139/84 | HR 92 | Temp 97.4°F | Ht 70.0 in | Wt 241.0 lb

## 2019-01-23 DIAGNOSIS — R911 Solitary pulmonary nodule: Secondary | ICD-10-CM | POA: Insufficient documentation

## 2019-01-23 DIAGNOSIS — J439 Emphysema, unspecified: Secondary | ICD-10-CM | POA: Diagnosis not present

## 2019-01-23 NOTE — Progress Notes (Signed)
Subjective:  [name], male    DOB: Feb 26, 1946, 73 y.o.   MRN: [id number]  Chief Complaint:  2 Week Follow-up   HPI: Craig Hurley is a 73 y.o. male presenting on 01/23/2019 for 2 Week Follow-up  Pt presents today for COPD exacerbation follow up. Pt completed his Levaquin and Prednisone. States he is feeling much better. States his cough has subsided. CXR at office visit revealed a lung nodule. A CT scan was completed and revealed two 3 mm left lung nodules that are likely benign with a recommended follow up CT in 1 year. Pt denies fever, chills, cough, chest pain, hemoptysis, weakness, or loss of weight. States he does have mild exertional shortness of breath. States this is nothing new and has improved since completion of above therapy. No confusion.   Relevant past medical, surgical, family, and social history reviewed and updated as indicated.  Allergies and medications reviewed and updated.   Past Medical History:  Diagnosis Date  . Allergy   . Arthritis   . Asthma   . Cataract     Past Surgical History:  Procedure Laterality Date  . BACK SURGERY  2005   lumbar  . EYE SURGERY     cataracts  . FRACTURE SURGERY Right   . SHOULDER ARTHROSCOPY Right   . SKIN GRAFT FULL THICKNESS TRUNK    . VASECTOMY      Social History   Socioeconomic History  . Marital status: Married    Spouse name: Not on file  . Number of children: 2  . Years of education: 88  . Highest education level: 12th grade  Occupational History  . Not on file  Social Needs  . Financial resource strain: Not hard at all  . Food insecurity:    Worry: Never true    Inability: Never true  . Transportation needs:    Medical: No    Non-medical: No  Tobacco Use  . Smoking status: Former Smoker    Types: Cigars  . Smokeless tobacco: Current User    Types: Snuff  Substance and Sexual Activity  . Alcohol use: No  . Drug use: No  . Sexual activity: Yes  Lifestyle  . Physical  activity:    Days per week: 0 days    Minutes per session: 0 min  . Stress: Not at all  Relationships  . Social connections:    Talks on phone: More than three times a week    Gets together: Once a week    Attends religious service: Never    Active member of club or organization: No    Attends meetings of clubs or organizations: Never    Relationship status: Married  . Intimate partner violence:    Fear of current or ex partner: No    Emotionally abused: No    Physically abused: No    Forced sexual activity: No  Other Topics Concern  . Not on file  Social History Narrative  . Not on file    Outpatient Encounter Medications as of 01/23/2019  Medication Sig  . acetaminophen (TYLENOL) 325 MG tablet Take 650 mg by mouth every 6 (six) hours as needed.  Marland Kitchen albuterol (PROVENTIL HFA;VENTOLIN HFA) 108 (90 Base) MCG/ACT inhaler Inhale 2 puffs into the lungs every 4 (four) hours as needed for wheezing or shortness of breath.  . cetirizine (ZYRTEC) 10 MG tablet Take 1 tablet (10 mg total) by mouth daily.  Marland Kitchen doxazosin (CARDURA) 4 MG tablet  Take 1 tablet (4 mg total) by mouth daily.  . fluticasone (FLONASE) 50 MCG/ACT nasal spray Place 2 sprays into both nostrils daily as needed for allergies or rhinitis.  . Fluticasone-Salmeterol (ADVAIR DISKUS) 250-50 MCG/DOSE AEPB Inhale 1 puff into the lungs 2 (two) times daily.  . meloxicam (MOBIC) 15 MG tablet Take 1 tablet (15 mg total) by mouth daily.  . montelukast (SINGULAIR) 10 MG tablet Take 1 tablet (10 mg total) by mouth at bedtime.  . Multiple Vitamins-Minerals (EYE VITAMINS PO) Take by mouth.  Marland Kitchen omeprazole (PRILOSEC) 20 MG capsule TAKE 1 CAPSULE BY MOUTH TWICE DAILY BEFORE MEALS  . TURMERIC CURCUMIN PO Take 1 Dose by mouth 2 (two) times daily.  Marland Kitchen umeclidinium bromide (INCRUSE ELLIPTA) 62.5 MCG/INH AEPB Inhale 1 puff into the lungs daily.  . vitamin A 8000 UNIT capsule Take 8,000 Units by mouth daily.  . [DISCONTINUED] polyethylene glycol powder  (GLYCOLAX/MIRALAX) powder Take 17 g by mouth daily as needed.  . [DISCONTINUED] predniSONE (DELTASONE) 20 MG tablet 2 po at sametime daily for 5 days  . [DISCONTINUED] benzonatate (TESSALON PERLES) 100 MG capsule Take 1 capsule (100 mg total) by mouth 3 (three) times daily as needed for cough.   No facility-administered encounter medications on file as of 01/23/2019.     Allergies  Allergen Reactions  . Penicillins Swelling    Review of Systems  Constitutional: Negative for activity change, appetite change, chills, fatigue, fever and unexpected weight change.  HENT: Negative for congestion.   Respiratory: Negative for cough, chest tightness, shortness of breath (mild, exertional) and wheezing.   Cardiovascular: Negative for chest pain, palpitations and leg swelling.  Skin: Negative for color change and pallor.  Neurological: Negative for weakness and headaches.  Psychiatric/Behavioral: Negative for confusion.  All other systems reviewed and are negative.       Objective:  BP 139/84   Pulse 92   Temp (!) 97.4 F (36.3 C) (Oral)   Ht 5\' 10"  (1.778 m)   Wt 241 lb (109.3 kg)   SpO2 98%   BMI 34.58 kg/m    Wt Readings from Last 3 Encounters:  01/23/19 241 lb (109.3 kg)  01/03/19 240 lb (108.9 kg)  12/24/18 244 lb (110.7 kg)    Physical Exam Vitals signs and nursing note reviewed.  Constitutional:      General: He is not in acute distress.    Appearance: Normal appearance. He is not ill-appearing or toxic-appearing.  HENT:     Head: Normocephalic and atraumatic.     Right Ear: Tympanic membrane, ear canal and external ear normal.     Left Ear: Tympanic membrane, ear canal and external ear normal.     Nose: Nose normal.     Mouth/Throat:     Mouth: Mucous membranes are moist.     Pharynx: Oropharynx is clear.  Eyes:     Conjunctiva/sclera: Conjunctivae normal.     Pupils: Pupils are equal, round, and reactive to light.  Cardiovascular:     Rate and Rhythm: Normal  rate and regular rhythm.     Heart sounds: Normal heart sounds. No murmur. No friction rub. No gallop.   Pulmonary:     Effort: Pulmonary effort is normal. No respiratory distress.     Breath sounds: Wheezing (mild, posterior bilateral bases) present. No rhonchi or rales.  Skin:    General: Skin is warm and dry.     Capillary Refill: Capillary refill takes less than 2 seconds.  Coloration: Skin is not cyanotic or pale.  Neurological:     General: No focal deficit present.     Mental Status: He is alert and oriented to person, place, and time.  Psychiatric:        Mood and Affect: Mood normal.        Behavior: Behavior normal. Behavior is cooperative.        Thought Content: Thought content normal.        Judgment: Judgment normal.     Results for orders placed or performed during the hospital encounter of 01/21/19  I-STAT creatinine  Result Value Ref Range   Creatinine, Ser 1.00 0.61 - 1.24 mg/dL       Pertinent labs & imaging results that were available during my care of the patient were reviewed by me and considered in my medical decision making.  Assessment & Plan:  Craig Hurley was seen today for 2 week follow-up.  Diagnoses and all orders for this visit:  Pulmonary emphysema, unspecified emphysema type ([place]) Doing well. No cough, increased shortness of breath, chest pain, or sputum production. Continue medications as prescribed. Report any new or worsening symptoms.   Lung Nodule Two 3 mm left lung nodules noted on chest CT. Follow up chest CT in one year unless new symptoms arise.   Continue all other maintenance medications.  Follow up plan: Return if symptoms worsen or fail to improve.  Educational handout given for COPD  The above assessment and management plan was discussed with the patient. The patient verbalized understanding of and has agreed to the management plan. Patient is aware to call the clinic if symptoms persist or worsen. Patient is aware when to  return to the clinic for a follow-up visit. Patient educated on when it is appropriate to go to the emergency department.   Monia Pouch, FNP-C [facility] [phone]

## 2019-01-23 NOTE — Patient Instructions (Signed)
COPD and Physical Activity  Chronic obstructive pulmonary disease (COPD) is a long-term (chronic) condition that affects the lungs. COPD is a general term that can be used to describe many different lung problems that cause lung swelling (inflammation) and limit airflow, including chronic bronchitis and emphysema.  The main symptom of COPD is shortness of breath, which makes it harder to do even simple tasks. This can also make it harder to exercise and be active. Talk with your health care provider about treatments to help you breathe better and actions you can take to prevent breathing problems during physical activity.  What are the benefits of exercising with COPD?  Exercising regularly is an important part of a healthy lifestyle. You can still exercise and do physical activities even though you have COPD. Exercise and physical activity improve your shortness of breath by increasing blood flow (circulation). This causes your heart to pump more oxygen through your body. Moderate exercise can improve your:  · Oxygen use.  · Energy level.  · Shortness of breath.  · Strength in your breathing muscles.  · Heart health.  · Sleep.  · Self-esteem and feelings of self-worth.  · Depression, stress, and anxiety levels.  Exercise can benefit everyone with COPD. The severity of your disease may affect how hard you can exercise, especially at first, but everyone can benefit. Talk with your health care provider about how much exercise is safe for you, and which activities and exercises are safe for you.  What actions can I take to prevent breathing problems during physical activity?  · Sign up for a pulmonary rehabilitation program. This type of program may include:  ? Education about lung diseases.  ? Exercise classes that teach you how to exercise and be more active while improving your breathing. This usually involves:  § Exercise using your lower extremities, such as a stationary bicycle.  § About 30 minutes of exercise, 2  to 5 times per week, for 6 to 12 weeks  § Strength training, such as push ups or leg lifts.  ? Nutrition education.  ? Group classes in which you can talk with others who also have COPD and learn ways to manage stress.  · If you use an oxygen tank, you should use it while you exercise. Work with your health care provider to adjust your oxygen for your physical activity. Your resting flow rate is different from your flow rate during physical activity.  · While you are exercising:  ? Take slow breaths.  ? Pace yourself and do not try to go too fast.  ? Purse your lips while breathing out. Pursing your lips is similar to a kissing or whistling position.  ? If doing exercise that uses a quick burst of effort, such as weight lifting:  § Breathe in before starting the exercise.  § Breathe out during the hardest part of the exercise (such as raising the weights).  Where to find support  You can find support for exercising with COPD from:  · Your health care provider.  · A pulmonary rehabilitation program.  · Your local health department or community health programs.  · Support groups, online or in-person. Your health care provider may be able to recommend support groups.  Where to find more information  You can find more information about exercising with COPD from:  · American Lung Association: lung.org.  · COPD Foundation: copdfoundation.org.  Contact a health care provider if:  · Your symptoms get worse.  · You   have chest pain.  · You have nausea.  · You have a fever.  · You have trouble talking or catching your breath.  · You want to start a new exercise program or a new activity.  Summary  · COPD is a general term that can be used to describe many different lung problems that cause lung swelling (inflammation) and limit airflow. This includes chronic bronchitis and emphysema.  · Exercise and physical activity improve your shortness of breath by increasing blood flow (circulation). This causes your heart to provide more  oxygen to your body.  · Contact your health care provider before starting any exercise program or new activity. Ask your health care provider what exercises and activities are safe for you.  This information is not intended to replace advice given to you by your health care provider. Make sure you discuss any questions you have with your health care provider.  Document Released: 12/06/2017 Document Revised: 12/06/2017 Document Reviewed: 12/06/2017  Elsevier Interactive Patient Education © 2019 [employer]

## 2019-04-07 DIAGNOSIS — B351 Tinea unguium: Secondary | ICD-10-CM | POA: Diagnosis not present

## 2019-05-01 ENCOUNTER — Ambulatory Visit (INDEPENDENT_AMBULATORY_CARE_PROVIDER_SITE_OTHER): Payer: Medicare Other | Admitting: Family Medicine

## 2019-05-01 ENCOUNTER — Other Ambulatory Visit: Payer: Self-pay

## 2019-05-01 ENCOUNTER — Encounter: Payer: Self-pay | Admitting: Family Medicine

## 2019-05-01 VITALS — BP 125/72 | HR 75 | Temp 97.4°F | Ht 70.0 in | Wt 250.0 lb

## 2019-05-01 DIAGNOSIS — K219 Gastro-esophageal reflux disease without esophagitis: Secondary | ICD-10-CM

## 2019-05-01 DIAGNOSIS — Z6834 Body mass index (BMI) 34.0-34.9, adult: Secondary | ICD-10-CM

## 2019-05-01 DIAGNOSIS — N401 Enlarged prostate with lower urinary tract symptoms: Secondary | ICD-10-CM | POA: Diagnosis not present

## 2019-05-01 DIAGNOSIS — J439 Emphysema, unspecified: Secondary | ICD-10-CM

## 2019-05-01 DIAGNOSIS — R350 Frequency of micturition: Secondary | ICD-10-CM | POA: Diagnosis not present

## 2019-05-01 DIAGNOSIS — R7303 Prediabetes: Secondary | ICD-10-CM | POA: Diagnosis not present

## 2019-05-01 DIAGNOSIS — E6609 Other obesity due to excess calories: Secondary | ICD-10-CM | POA: Diagnosis not present

## 2019-05-01 LAB — LIPID PANEL

## 2019-05-01 LAB — BAYER DCA HB A1C WAIVED: HB A1C (BAYER DCA - WAIVED): 5.9 % (ref <7.0)

## 2019-05-01 NOTE — Progress Notes (Signed)
° °BP 125/72    Pulse 75    Temp (!) 97.4 °F (36.3 °C) (Oral)    Ht 5' 10" (1.778 m)    Wt 250 lb (113.4 kg)    BMI 35.87 kg/m²   ° °Subjective:  ° °[name], male    DOB: 05/13/1946, 72 y.o.   MRN: [id number] ° °HPI: °Craig Hurley is a 72 y.o. male presenting on 05/01/2019 for Medical Management of Chronic Issues (6 month follow up) ° ° °HPI °Prediabetes °Patient comes in today for recheck of his diabetes. Patient has been currently taking no medication has been diet control and has been doing well, will check A1c today. Patient is not currently on an ACE inhibitor/ARB. Patient has not seen an ophthalmologist this year. Patient denies any issues with their feet. ° °GERD °Patient is currently on omeprazole.  She denies any major symptoms or abdominal pain or belching or burping. She denies any blood in her stool or lightheadedness or dizziness.  ° °COPD °Patient is coming in for COPD recheck today.  He is currently on Advair and Incruse.  He has a mild chronic cough but denies any major coughing spells or wheezing spells.  He has 0nighttime symptoms per week and 1daytime symptoms per week currently.  ° °BPH °Patient has BPH with some urinary frequency although has been doing mostly well for him, he is currently taking the doxazosin to help with this and feels like it is doing pretty well. ° °Relevant past medical, surgical, family and social history reviewed and updated as indicated. Interim medical history since our last visit reviewed. °Allergies and medications reviewed and updated. ° °Review of Systems  °Constitutional: Negative for chills and fever.  °Eyes: Negative for visual disturbance.  °Respiratory: Negative for shortness of breath and wheezing.   °Cardiovascular: Negative for chest pain and leg swelling.  °Genitourinary: Positive for frequency.  °Musculoskeletal: Negative for back pain and gait problem.  °Skin: Negative for rash.  °Neurological: Negative for dizziness, weakness and  light-headedness.  °All other systems reviewed and are negative. ° °Per HPI unless specifically indicated above ° ° °Allergies as of 05/01/2019   °   Reactions  ° Penicillins Swelling  °  °  °Medication List  °  °  ° Accurate as of May 01, 2019  9:00 AM. If you have any questions, ask your nurse or doctor.  °  °  °  °acetaminophen 325 MG tablet °Commonly known as:  TYLENOL °Take 650 mg by mouth every 6 (six) hours as needed. °  °albuterol 108 (90 Base) MCG/ACT inhaler °Commonly known as:  VENTOLIN HFA °Inhale 2 puffs into the lungs every 4 (four) hours as needed for wheezing or shortness of breath. °  °cetirizine 10 MG tablet °Commonly known as:  ZYRTEC °Take 1 tablet (10 mg total) by mouth daily. °  °doxazosin 4 MG tablet °Commonly known as:  CARDURA °Take 1 tablet (4 mg total) by mouth daily. °  °EYE VITAMINS PO °Take by mouth. °  °fluticasone 50 MCG/ACT nasal spray °Commonly known as:  FLONASE °Place 2 sprays into both nostrils daily as needed for allergies or rhinitis. °  °Fluticasone-Salmeterol 250-50 MCG/DOSE Aepb °Commonly known as:  Advair Diskus °Inhale 1 puff into the lungs 2 (two) times daily. °  °meloxicam 15 MG tablet °Commonly known as:  MOBIC °Take 1 tablet (15 mg total) by mouth daily. °  °montelukast 10 MG tablet °Commonly known as:  SINGULAIR °Take 1 tablet (10 mg   mg total) by mouth at bedtime.   omeprazole 20 MG capsule Commonly known as:  PRILOSEC TAKE 1 CAPSULE BY MOUTH TWICE DAILY BEFORE MEALS   TURMERIC CURCUMIN PO Take 1 Dose by mouth 2 (two) times daily.   umeclidinium bromide 62.5 MCG/INH Aepb Commonly known as:  Incruse Ellipta Inhale 1 puff into the lungs daily.   vitamin A 8000 UNIT capsule Take 8,000 Units by mouth daily.        Objective:   BP 125/72    Pulse 75    Temp (!) 97.4 F (36.3 C) (Oral)    Ht 5' 10" (1.778 m)    Wt 250 lb (113.4 kg)    BMI 35.87 kg/m   Wt Readings from Last 3 Encounters:  05/01/19 250 lb (113.4 kg)  01/23/19 241 lb (109.3 kg)  01/03/19  240 lb (108.9 kg)    Physical Exam Vitals signs and nursing note reviewed.  Constitutional:      General: He is not in acute distress.    Appearance: He is well-developed. He is not diaphoretic.  Eyes:     General: No scleral icterus.    Conjunctiva/sclera: Conjunctivae normal.  Neck:     Musculoskeletal: Neck supple.     Thyroid: No thyromegaly.  Cardiovascular:     Rate and Rhythm: Normal rate and regular rhythm.     Heart sounds: Normal heart sounds. No murmur.  Pulmonary:     Effort: Pulmonary effort is normal. No respiratory distress.     Breath sounds: Normal breath sounds. No wheezing.  Lymphadenopathy:     Cervical: No cervical adenopathy.  Skin:    General: Skin is warm and dry.     Findings: No rash.  Neurological:     Mental Status: He is alert and oriented to person, place, and time.     Coordination: Coordination normal.  Psychiatric:        Behavior: Behavior normal.      Assessment & Plan:   Problem List Items Addressed This Visit      Respiratory   COPD (chronic obstructive pulmonary disease) with emphysema (HCC)     Digestive   GERD (gastroesophageal reflux disease)   Relevant Orders   CBC with Differential/Platelet     Genitourinary   BPH (benign prostatic hyperplasia)   Relevant Orders   PSA, total and free     Other   Obesity   Relevant Orders   Lipid panel   Prediabetes - Primary   Relevant Orders   CMP14+EGFR   Lipid panel   Bayer DCA Hb A1c Waived       Follow up plan: Return in about 6 months (around 10/31/2019), or if symptoms worsen or fail to improve, for Recheck prediabetes and GERD.  Counseling provided for all of the vaccine components Orders Placed This Encounter  Procedures   CBC with Differential/Platelet   CMP14+EGFR   Lipid panel   PSA, total and free   Bayer DCA Hb A1c Waived    Caryl Pina, MD [facility] 05/01/2019, 9:00 AM

## 2019-05-02 LAB — CBC WITH DIFFERENTIAL/PLATELET
Basophils Absolute: 0 10*3/uL (ref 0.0–0.2)
Basos: 1 %
EOS (ABSOLUTE): 0.1 10*3/uL (ref 0.0–0.4)
Eos: 2 %
Hematocrit: 45.5 % (ref 37.5–51.0)
Hemoglobin: 15.3 g/dL (ref 13.0–17.7)
Immature Grans (Abs): 0 10*3/uL (ref 0.0–0.1)
Immature Granulocytes: 0 %
Lymphocytes Absolute: 1.6 10*3/uL (ref 0.7–3.1)
Lymphs: 24 %
MCH: 27.5 pg (ref 26.6–33.0)
MCHC: 33.6 g/dL (ref 31.5–35.7)
MCV: 82 fL (ref 79–97)
Monocytes Absolute: 0.6 10*3/uL (ref 0.1–0.9)
Monocytes: 10 %
Neutrophils Absolute: 4.3 10*3/uL (ref 1.4–7.0)
Neutrophils: 63 %
Platelets: 208 10*3/uL (ref 150–450)
RBC: 5.56 x10E6/uL (ref 4.14–5.80)
RDW: 13.3 % (ref 11.6–15.4)
WBC: 6.7 10*3/uL (ref 3.4–10.8)

## 2019-05-02 LAB — CMP14+EGFR
ALT: 16 IU/L (ref 0–44)
AST: 14 IU/L (ref 0–40)
Albumin/Globulin Ratio: 1.9 (ref 1.2–2.2)
Albumin: 4.2 g/dL (ref 3.7–4.7)
Alkaline Phosphatase: 72 IU/L (ref 39–117)
BUN/Creatinine Ratio: 22 (ref 10–24)
BUN: 23 mg/dL (ref 8–27)
Bilirubin Total: 0.3 mg/dL (ref 0.0–1.2)
CO2: 22 mmol/L (ref 20–29)
Calcium: 9.1 mg/dL (ref 8.6–10.2)
Chloride: 104 mmol/L (ref 96–106)
Creatinine, Ser: 1.03 mg/dL (ref 0.76–1.27)
GFR calc Af Amer: 83 mL/min/{1.73_m2} (ref >59)
GFR calc non Af Amer: 72 mL/min/{1.73_m2} (ref >59)
Globulin, Total: 2.2 g/dL (ref 1.5–4.5)
Glucose: 117 mg/dL — ABNORMAL HIGH (ref 65–99)
Potassium: 4.2 mmol/L (ref 3.5–5.2)
Sodium: 139 mmol/L (ref 134–144)
Total Protein: 6.4 g/dL (ref 6.0–8.5)

## 2019-05-02 LAB — PSA, TOTAL AND FREE
PSA, Free Pct: 27.2 %
PSA, Free: 0.49 ng/mL
Prostate Specific Ag, Serum: 1.8 ng/mL (ref 0.0–4.0)

## 2019-05-02 LAB — LIPID PANEL
Chol/HDL Ratio: 3.2 ratio (ref 0.0–5.0)
Cholesterol, Total: 177 mg/dL (ref 100–199)
HDL: 55 mg/dL (ref >39)
LDL Calculated: 107 mg/dL — ABNORMAL HIGH (ref 0–99)
Triglycerides: 77 mg/dL (ref 0–149)
VLDL Cholesterol Cal: 15 mg/dL (ref 5–40)

## 2019-06-15 ENCOUNTER — Other Ambulatory Visit: Payer: Self-pay | Admitting: Family Medicine

## 2019-06-15 DIAGNOSIS — K219 Gastro-esophageal reflux disease without esophagitis: Secondary | ICD-10-CM

## 2019-06-17 DIAGNOSIS — Z961 Presence of intraocular lens: Secondary | ICD-10-CM | POA: Diagnosis not present

## 2019-07-01 DIAGNOSIS — B351 Tinea unguium: Secondary | ICD-10-CM | POA: Diagnosis not present

## 2019-07-14 ENCOUNTER — Ambulatory Visit (INDEPENDENT_AMBULATORY_CARE_PROVIDER_SITE_OTHER): Payer: Medicare Other | Admitting: *Deleted

## 2019-07-14 VITALS — Ht 70.0 in | Wt 250.0 lb

## 2019-07-14 DIAGNOSIS — Z0000 Encounter for general adult medical examination without abnormal findings: Secondary | ICD-10-CM

## 2019-07-14 NOTE — Progress Notes (Signed)
MEDICARE ANNUAL WELLNESS VISIT  07/14/2019  Telephone Visit Disclaimer This Medicare AWV was conducted by telephone due to national recommendations for restrictions regarding the COVID-19 Pandemic (e.g. social distancing).  I verified, using two identifiers, that I am speaking with Craig Hurley or their authorized healthcare agent. I discussed the limitations, risks, security, and privacy concerns of performing an evaluation and management service by telephone and the potential availability of an in-person appointment in the future. The patient expressed understanding and agreed to proceed.   Subjective:  Craig Hurley is a 73 y.o. male patient of Dettinger, Fransisca Kaufmann, MD who had a Medicare Annual Wellness Visit today via telephone. Craig Hurley is Retired and lives with their spouse. he has 2 children. he reports that he is socially active and does interact with friends/family regularly. he is not physically active and enjoys fishing.  Patient Care Team: Dettinger, Fransisca Kaufmann, MD as PCP - General (Family Medicine)  Advanced Directives 07/14/2019 11/05/2018 07/11/2018 07/05/2017 11/23/2015  Does Patient Have a Medical Advance Directive? Yes No Yes No No  Type of Advance Directive Living will - [place];Living will - -  Does patient want to make changes to medical advance directive? No - Patient declined - - - -  Copy of [place] in Chart? - - No - copy requested - -  Would patient like information on creating a medical advance directive? - - Yes (MAU/Ambulatory/Procedural Areas - Information given) Yes (MAU/Ambulatory/Procedural Areas - Information given) -    Hospital Utilization Over the Past 12 Months: # of hospitalizations or ER visits: 0 # of surgeries: 0  Review of Systems    Patient reports that his overall health is unchanged compared to last year.  Patient Reported Readings (BP, Pulse, CBG, Weight, etc) none  Review of Systems:  History obtained from chart review and the patient General ROS: negative Ophthalmic ROS: positive for - uses glasses ENT ROS: positive for - Wears hearing aids  All other systems negative.  Pain Assessment Pain : No/denies pain     Current Medications & Allergies (verified) Allergies as of 07/14/2019      Reactions   Penicillins Swelling      Medication List       Accurate as of July 14, 2019  8:46 AM. If you have any questions, ask your nurse or doctor.        acetaminophen 325 MG tablet Commonly known as: TYLENOL Take 650 mg by mouth every 6 (six) hours as needed.   albuterol 108 (90 Base) MCG/ACT inhaler Commonly known as: VENTOLIN HFA Inhale 2 puffs into the lungs every 4 (four) hours as needed for wheezing or shortness of breath.   cetirizine 10 MG tablet Commonly known as: ZYRTEC Take 1 tablet (10 mg total) by mouth daily.   doxazosin 4 MG tablet Commonly known as: CARDURA Take 1 tablet (4 mg total) by mouth daily.   EYE VITAMINS PO Take by mouth.   fluticasone 50 MCG/ACT nasal spray Commonly known as: FLONASE Place 2 sprays into both nostrils daily as needed for allergies or rhinitis.   Fluticasone-Salmeterol 250-50 MCG/DOSE Aepb Commonly known as: Advair Diskus Inhale 1 puff into the lungs 2 (two) times daily.   meloxicam 15 MG tablet Commonly known as: MOBIC Take 1 tablet (15 mg total) by mouth daily.   montelukast 10 MG tablet Commonly known as: SINGULAIR Take 1 tablet (10 mg total) by mouth at bedtime.   omeprazole 20 MG  capsule Commonly known as: PRILOSEC TAKE 1 CAPSULE BY MOUTH TWICE DAILY BEFORE MEAL(S)   TURMERIC CURCUMIN PO Take 1 Dose by mouth 2 (two) times daily.   umeclidinium bromide 62.5 MCG/INH Aepb Commonly known as: Incruse Ellipta Inhale 1 puff into the lungs daily.   vitamin A 8000 UNIT capsule Take 8,000 Units by mouth daily.       History (reviewed): Past Medical History:  Diagnosis Date  . Allergy   .  Arthritis   . Asthma   . Cataract    Past Surgical History:  Procedure Laterality Date  . BACK SURGERY  2005   lumbar  . EYE SURGERY     cataracts  . FRACTURE SURGERY Right   . SHOULDER ARTHROSCOPY Right   . SKIN GRAFT FULL THICKNESS TRUNK    . VASECTOMY     Family History  Adopted: Yes  Problem Relation Age of Onset  . Cancer Mother        colon cancer   Social History   Socioeconomic History  . Marital status: Married    Spouse name: Retired  . Number of children: 2  . Years of education: 53  . Highest education level: 12th grade  Occupational History  . Not on file  Social Needs  . Financial resource strain: Not hard at all  . Food insecurity    Worry: Never true    Inability: Never true  . Transportation needs    Medical: No    Non-medical: No  Tobacco Use  . Smoking status: Former Smoker    Types: Cigars  . Smokeless tobacco: Current User    Types: Snuff  Substance and Sexual Activity  . Alcohol use: No  . Drug use: No  . Sexual activity: Yes  Lifestyle  . Physical activity    Days per week: 0 days    Minutes per session: 0 min  . Stress: Not at all  Relationships  . Social connections    Talks on phone: More than three times a week    Gets together: Once a week    Attends religious service: Never    Active member of club or organization: No    Attends meetings of clubs or organizations: Never    Relationship status: Married  Other Topics Concern  . Not on file  Social History Narrative  . Not on file    Activities of Daily Living No flowsheet data found.  Patient Education/ Literacy How often do you need to have someone help you when you read instructions, pamphlets, or other written materials from your doctor or pharmacy?: 1 - Never What is the last grade level you completed in school?: 12th Grade  Exercise    Diet Patient reports consuming 2 meals a day and 0 snack(s) a day Patient reports that his primary diet is: Regular  Patient reports that she does have regular access to food.   Depression Screen PHQ 2/9 Scores 07/14/2019 05/01/2019 01/23/2019 01/03/2019 12/24/2018 11/11/2018 10/30/2018  PHQ - 2 Score 0 0 0 0 0 0 0     Fall Risk Fall Risk  07/14/2019 05/01/2019 01/03/2019 12/24/2018 11/11/2018  Falls in the past year? 0 0 0 0 1  Number falls in past yr: 0 - - - 1  Injury with Fall? 0 - - - 1     Objective:  Craig Hurley seemed alert and oriented and he participated appropriately during our telephone visit.  Blood Pressure Weight BMI  BP Readings from Last  3 Encounters:  05/01/19 125/72  01/23/19 139/84  01/03/19 126/69   Wt Readings from Last 3 Encounters:  07/14/19 250 lb (113.4 kg)  05/01/19 250 lb (113.4 kg)  01/23/19 241 lb (109.3 kg)   BMI Readings from Last 1 Encounters:  07/14/19 35.87 kg/m    *Unable to obtain current vital signs, weight, and BMI due to telephone visit type  Hearing/Vision  . Craig Hurley did  seem to have difficulty with hearing/understanding during the telephone conversation . Reports that he has had a formal eye exam by an eye care professional within the past year . Reports that he has had a formal hearing evaluation within the past year *Unable to fully assess hearing and vision during telephone visit type  Cognitive Function: 6CIT Screen 07/14/2019  What Year? 0 points  What month? 0 points  What time? 0 points  Count back from 20 0 points  Months in reverse 2 points  Repeat phrase 2 points  Total Score 4   (Normal:0-7, Significant for Dysfunction: >8)  Normal Cognitive Function Screening: Yes   Immunization & Health Maintenance Record Immunization History  Administered Date(s) Administered  . H1N1 01/14/2009  . Influenza Split 12/25/2005, 10/12/2009, 11/07/2010, 07/18/2011, 11/14/2012, 08/30/2013  . Influenza, High Dose Seasonal PF 09/28/2017, 09/11/2018  . Influenza,inj,Quad PF,6+ Mos 09/12/2014, 08/18/2015, 10/25/2016  . Influenza-Unspecified 09/11/2018   . Pneumococcal Conjugate-13 01/18/2015  . Pneumococcal Polysaccharide-23 01/12/2011  . Td 11/22/1998, 07/11/2018  . Tdap 01/08/2008  . Zoster 01/17/2012    Health Maintenance  Topic Date Due  . INFLUENZA VACCINE  06/28/2019  . COLONOSCOPY  05/24/2025  . TETANUS/TDAP  07/11/2028  . Hepatitis C Screening  Completed  . PNA vac Low Risk Adult  Addressed       Assessment  This is a routine wellness examination for Craig Hurley.  Health Maintenance: Due or Overdue Health Maintenance Due  Topic Date Due  . INFLUENZA VACCINE  06/28/2019    Craig Hurley does not need a referral for Community Assistance: Care Management:   no Social Work:    no Prescription Assistance:  no Nutrition/Diabetes Education:  no   Plan:  Personalized Goals Goals Addressed   None    Personalized Health Maintenance & Screening Recommendations  Influenza vaccine  Lung Cancer Screening Recommended: no (Low Dose CT Chest recommended if Age 82-80 years, 30 pack-year currently smoking OR have quit w/in past 15 years) Hepatitis C Screening recommended: no HIV Screening recommended: no  Advanced Directives: Written information was not prepared per patient's request.  Referrals & Orders No orders of the defined types were placed in this encounter.   Follow-up Plan . Follow-up with Dettinger, Fransisca Kaufmann, MD as planned   I have personally reviewed and noted the following in the patient's chart:   . Medical and social history . Use of alcohol, tobacco or illicit drugs  . Current medications and supplements . Functional ability and status . Nutritional status . Physical activity . Advanced directives . List of other physicians . Hospitalizations, surgeries, and ER visits in previous 12 months . Vitals . Screenings to include cognitive, depression, and falls . Referrals and appointments  In addition, I have reviewed and discussed with Craig Hurley certain preventive protocols,  quality metrics, and best practice recommendations. A written personalized care plan for preventive services as well as general preventive health recommendations is available and can be mailed to the patient at his request.      Craig Heath, LPN  3/00/9233

## 2019-07-14 NOTE — Patient Instructions (Signed)
Craig Hurley , Thank you for taking time to come for your Medicare Wellness Visit. I appreciate your ongoing commitment to your health goals. Please review the following plan we discussed and let me know if I can assist you in the future.   These are the goals we discussed: Goals    . Exercise 3x per week (30 min per time)    . Exercise 3x per week (30 min per time)    . Have 3 meals a day       This is a list of the screening recommended for you and due dates:  Health Maintenance  Topic Date Due  . Flu Shot  06/28/2019  . Colon Cancer Screening  05/24/2025  . Tetanus Vaccine  07/11/2028  .  Hepatitis C: One time screening is recommended by Center for Disease Control  (CDC) for  adults born from [birth year] through 1966.   Completed  . Pneumonia vaccines  Addressed     BMI for Adults  Body mass index (BMI) is a number that is calculated from a person's weight and height. BMI may help to estimate how much of a person's weight is composed of fat. BMI can help identify those who may be at higher risk for certain medical problems. How is BMI used with adults? BMI is used as a screening tool to identify possible weight problems. It is used to check whether a person is obese, overweight, healthy weight, or underweight. How is BMI calculated? BMI measures your weight and compares it to your height. This can be done either in [place] ([place] or metric measurements. Note that charts are available to help you find your BMI quickly and easily without having to do these calculations yourself. To calculate your BMI in English (U.S[place] measurements, your health care provider will: 1. Measure your weight in pounds (lb). 2. Multiply the number of pounds by 703. ? For example, for a person who weighs 180 lb, multiply that number by 703, which equals 126,540. 3. Measure your height in inches (in). Then multiply that number by itself to get a measurement called "inches squared." ? For example, for a person  who is 70 in tall, the "inches squared" measurement is 70 in x 70 in, which equals 4900 inches squared. 4. Divide the total from Step 2 (number of lb x 703) by the total from Step 3 (inches squared): 126,540  4900 = 25.8. This is your BMI. To calculate your BMI in metric measurements, your health care provider will: 1. Measure your weight in kilograms (kg). 2. Measure your height in meters (m). Then multiply that number by itself to get a measurement called "meters squared." ? For example, for a person who is 1.75 m tall, the "meters squared" measurement is 1.75 m x 1.75 m, which is equal to 3.1 meters squared. 3. Divide the number of kilograms (your weight) by the meters squared number. In this example: 70  3.1 = 22.6. This is your BMI. How is BMI interpreted? To interpret your results, your health care provider will use BMI charts to identify whether you are underweight, normal weight, overweight, or obese. The following guidelines will be used:  Underweight: BMI less than 18.5.  Normal weight: BMI between 18.5 and 24.9.  Overweight: BMI between 25 and 29.9.  Obese: BMI of 30 and above. Please note:  Weight includes both fat and muscle, so someone with a muscular build, such as an athlete, may have a BMI that is higher than 24.9.  In cases like these, BMI is not an accurate measure of body fat.  To determine if excess body fat is the cause of a BMI of 25 or higher, further assessments may need to be done by a health care provider.  BMI is usually interpreted in the same way for men and women. Why is BMI a useful tool? BMI is useful in two ways:  Identifying a weight problem that may be related to a medical condition, or that may increase the risk for medical problems.  Promoting lifestyle and diet changes in order to reach a healthy weight. Summary  Body mass index (BMI) is a number that is calculated from a person's weight and height.  BMI may help to estimate how much of a  person's weight is composed of fat. BMI can help identify those who may be at higher risk for certain medical problems.  BMI can be measured using English measurements or metric measurements.  To interpret your results, your health care provider will use BMI charts to identify whether you are underweight, normal weight, overweight, or obese. This information is not intended to replace advice given to you by your health care provider. Make sure you discuss any questions you have with your health care provider. Document Released: 07/25/2004 Document Revised: 10/26/2017 Document Reviewed: 09/26/2017 Elsevier Patient Education  2020 [employer].

## 2019-08-06 ENCOUNTER — Other Ambulatory Visit: Payer: Self-pay

## 2019-08-06 ENCOUNTER — Ambulatory Visit (INDEPENDENT_AMBULATORY_CARE_PROVIDER_SITE_OTHER): Payer: Medicare Other

## 2019-08-06 DIAGNOSIS — Z23 Encounter for immunization: Secondary | ICD-10-CM

## 2019-08-17 ENCOUNTER — Other Ambulatory Visit: Payer: Self-pay | Admitting: Family Medicine

## 2019-08-17 DIAGNOSIS — K219 Gastro-esophageal reflux disease without esophagitis: Secondary | ICD-10-CM

## 2019-09-30 DIAGNOSIS — B351 Tinea unguium: Secondary | ICD-10-CM | POA: Diagnosis not present

## 2019-10-21 ENCOUNTER — Other Ambulatory Visit: Payer: Self-pay

## 2019-10-21 DIAGNOSIS — Z20822 Contact with and (suspected) exposure to covid-19: Secondary | ICD-10-CM

## 2019-10-21 DIAGNOSIS — Z20828 Contact with and (suspected) exposure to other viral communicable diseases: Secondary | ICD-10-CM | POA: Diagnosis not present

## 2019-10-22 LAB — NOVEL CORONAVIRUS, NAA: SARS-CoV-2, NAA: NOT DETECTED

## 2019-10-27 ENCOUNTER — Telehealth: Payer: Self-pay | Admitting: Family Medicine

## 2019-10-27 NOTE — Chronic Care Management (AMB) (Signed)
Chronic Care Management   Note  10/27/2019 Name: Craig Hurley MRN: [id number] DOB: [birth year]  Craig Hurley is a 73 y.o. year old male who is a primary care patient of Dettinger, Fransisca Kaufmann, MD. I reached out to Lamont Dowdy by phone today in response to a referral sent by [name]'s health plan.     [name] was given information about Chronic Care Management services today including:  1. CCM service includes personalized support from designated clinical staff supervised by his physician, including individualized plan of care and coordination with other care providers 2. 24/7 contact phone numbers for assistance for urgent and routine care needs. 3. Service will only be billed when office clinical staff spend 20 minutes or more in a month to coordinate care. 4. Only one practitioner may furnish and bill the service in a calendar month. 5. The patient may stop CCM services at any time (effective at the end of the month) by phone call to the office staff. 6. The patient will be responsible for cost sharing (co-pay) of up to 20% of the service fee (after annual deductible is met).  Patient did not agree to services and wishes to discuss with PCP before deciding about enrollment in care management services.   Follow up plan: The care management team is available to follow up with the patient after provider conversation with the patient regarding recommendation for care management engagement and subsequent re-referral to the care management team.   [place], [place], [place] [address] Direct Dial: [email or web address]  Website: [email or web address]

## 2019-10-31 ENCOUNTER — Ambulatory Visit: Payer: Medicare Other | Admitting: Family Medicine

## 2019-11-25 ENCOUNTER — Other Ambulatory Visit: Payer: Self-pay | Admitting: Family Medicine

## 2019-11-25 ENCOUNTER — Encounter: Payer: Self-pay | Admitting: Family Medicine

## 2019-11-25 DIAGNOSIS — J439 Emphysema, unspecified: Secondary | ICD-10-CM

## 2019-12-02 MED ORDER — SPIRIVA HANDIHALER 18 MCG IN CAPS: 18.0000 ug | ORAL_CAPSULE | Freq: Every day | RESPIRATORY_TRACT | 3 refills | Status: DC

## 2019-12-05 ENCOUNTER — Telehealth: Payer: Self-pay | Admitting: *Deleted

## 2019-12-05 NOTE — Telephone Encounter (Signed)
Spiriva Non-Preferred by pt insurance  To get a tier exception why would the lower cost-sharing tier medications not be as affective/would cause adverse effect for the member?

## 2019-12-05 NOTE — Telephone Encounter (Signed)
As far as I know that there are only 2 medications that are strictly anticholinergics, 1 is Incruse which he was already on but was too expensive and the other one is Spiriva.  He told me that Spiriva would be covered by his insurance because he called them, he is already tried the other one.

## 2019-12-08 ENCOUNTER — Other Ambulatory Visit: Payer: Self-pay | Admitting: Family Medicine

## 2019-12-08 DIAGNOSIS — M19012 Primary osteoarthritis, left shoulder: Secondary | ICD-10-CM

## 2019-12-08 DIAGNOSIS — J439 Emphysema, unspecified: Secondary | ICD-10-CM

## 2019-12-08 DIAGNOSIS — K219 Gastro-esophageal reflux disease without esophagitis: Secondary | ICD-10-CM

## 2019-12-08 NOTE — Telephone Encounter (Signed)
OV 12/26/19

## 2019-12-08 NOTE — Telephone Encounter (Signed)
Prior Auth for [employer]  Form filled out, signed and faxed

## 2019-12-08 NOTE — Telephone Encounter (Signed)
I will bring the form back there for you to look at.

## 2019-12-09 NOTE — Telephone Encounter (Signed)
Spiriva was denied

## 2019-12-10 NOTE — Telephone Encounter (Signed)
Please let the patient know and tell him that he will need to continue his Incruse

## 2019-12-10 NOTE — Telephone Encounter (Signed)
lmtcb

## 2019-12-12 NOTE — Telephone Encounter (Signed)
Spiriva was nine dollars after they paid a 400 dollar deductible.

## 2019-12-23 ENCOUNTER — Ambulatory Visit (INDEPENDENT_AMBULATORY_CARE_PROVIDER_SITE_OTHER): Payer: Medicare Other | Admitting: Family Medicine

## 2019-12-23 DIAGNOSIS — J441 Chronic obstructive pulmonary disease with (acute) exacerbation: Secondary | ICD-10-CM

## 2019-12-23 DIAGNOSIS — J439 Emphysema, unspecified: Secondary | ICD-10-CM | POA: Diagnosis not present

## 2019-12-23 MED ORDER — ALBUTEROL SULFATE HFA 108 (90 BASE) MCG/ACT IN AERS: 2.0000 | INHALATION_SPRAY | RESPIRATORY_TRACT | 1 refills | Status: DC | PRN

## 2019-12-23 MED ORDER — DOXYCYCLINE HYCLATE 100 MG PO TABS: 100.0000 mg | ORAL_TABLET | Freq: Two times a day (BID) | ORAL | 0 refills | Status: DC

## 2019-12-23 MED ORDER — PREDNISONE 20 MG PO TABS: 40.0000 mg | ORAL_TABLET | Freq: Every day | ORAL | 0 refills | Status: AC

## 2019-12-23 MED ORDER — BENZONATATE 100 MG PO CAPS: 100.0000 mg | ORAL_CAPSULE | Freq: Three times a day (TID) | ORAL | 0 refills | Status: DC | PRN

## 2019-12-23 NOTE — Progress Notes (Signed)
Telephone visit  Subjective: CC: bad cough PCP: Dettinger, Fransisca Kaufmann, MD [phone] Kepp is a 74 y.o. male calls for telephone consult today. Patient provides verbal consent for consult held via phone.  Due to COVID-19 pandemic this visit was conducted virtually. This visit type was conducted due to national recommendations for restrictions regarding the COVID-19 Pandemic (e.g. social distancing, sheltering in place) in an effort to limit this patient's exposure and mitigate transmission in our community. All issues noted in this document were discussed and addressed.  A physical exam was not performed with this format.   Location of patient: home Location of provider: Working remotely from home Others present for call: wife  1. Cough Wife reports that cough has been present for 2 weeks.  She reports productive cough (yellow mucus).  No hemoptysis.  He has had chills but no measured fevers. She reports rhinorrhea.  No diarrhea.  He has been taking Tylenol sinus and Nyquil, which he vomited this am.  He is also using his inhalers as directed.  He is keeping fluids down.   ROS: Per HPI  Allergies  Allergen Reactions  . Penicillins Swelling   Past Medical History:  Diagnosis Date  . Allergy   . Arthritis   . Asthma   . Cataract     Current Outpatient Medications:  .  acetaminophen (TYLENOL) 325 MG tablet, Take 650 mg by mouth every 6 (six) hours as needed., Disp: , Rfl:  .  albuterol (PROVENTIL HFA;VENTOLIN HFA) 108 (90 Base) MCG/ACT inhaler, Inhale 2 puffs into the lungs every 4 (four) hours as needed for wheezing or shortness of breath., Disp: 2 Inhaler, Rfl: 5 .  cetirizine (ZYRTEC) 10 MG tablet, Take 1 tablet (10 mg total) by mouth daily., Disp: 90 tablet, Rfl: 5 .  doxazosin (CARDURA) 4 MG tablet, Take 1 tablet by mouth once daily, Disp: 90 tablet, Rfl: 0 .  fluticasone (FLONASE) 50 MCG/ACT nasal spray, Place 2 sprays into both nostrils daily as needed for allergies or  rhinitis., Disp: 16 g, Rfl: 6 .  Fluticasone-Salmeterol (ADVAIR) 250-50 MCG/DOSE AEPB, INHALE 1 DOSE BY MOUTH TWICE DAILY, Disp: 60 each, Rfl: 0 .  INCRUSE ELLIPTA 62.5 MCG/INH AEPB, Inhale 1 puff by mouth once daily, Disp: 30 each, Rfl: 0 .  meloxicam (MOBIC) 15 MG tablet, Take 1 tablet by mouth once daily, Disp: 90 tablet, Rfl: 0 .  montelukast (SINGULAIR) 10 MG tablet, TAKE 1 TABLET BY MOUTH AT BEDTIME, Disp: 90 tablet, Rfl: 0 .  Multiple Vitamins-Minerals (EYE VITAMINS PO), Take by mouth., Disp: , Rfl:  .  omeprazole (PRILOSEC) 20 MG capsule, TAKE 1 CAPSULE BY MOUTH TWICE DAILY BEFORE MEAL(S), Disp: 180 capsule, Rfl: 0 .  tiotropium (SPIRIVA HANDIHALER) 18 MCG inhalation capsule, Place 1 capsule (18 mcg total) into inhaler and inhale daily., Disp: 90 capsule, Rfl: 3 .  TURMERIC CURCUMIN PO, Take 1 Dose by mouth 2 (two) times daily., Disp: , Rfl:  .  vitamin A 8000 UNIT capsule, Take 8,000 Units by mouth daily., Disp: , Rfl:   Assessment/ Plan: 74 y.o. male   1. COPD with acute exacerbation ([place]) Uncertain etiology.  We discussed the remote possibility of Covid causing.  I have gone ahead and ordered a Covid test and gave him instructions on how to schedule.  We discussed that he may benefit from evaluation at urgent care or the respiratory center, particularly if he is not responding to the medications as expected.  They understand red flag signs and symptoms  warranting further evaluation emergency department.  He is to use his albuterol inhaler consistently for the next 2 days and then as needed as directed.  He has an appointment with Dr. Warrick Parisian on Friday but we discussed the symptoms are not resolving, he may need to make this a telephone visit and then proceed with eval in the respiratory center.  His wife voiced understanding and will follow up accordingly - predniSONE (DELTASONE) 20 MG tablet; Take 2 tablets (40 mg total) by mouth daily with breakfast for 5 days.  Dispense: 10 tablet;  Refill: 0 - doxycycline (VIBRA-TABS) 100 MG tablet; Take 1 tablet (100 mg total) by mouth 2 (two) times daily.  Dispense: 20 tablet; Refill: 0 - benzonatate (TESSALON PERLES) 100 MG capsule; Take 1 capsule (100 mg total) by mouth 3 (three) times daily as needed for cough.  Dispense: 20 capsule; Refill: 0 - albuterol (VENTOLIN HFA) 108 (90 Base) MCG/ACT inhaler; Inhale 2 puffs into the lungs every 4 (four) hours as needed for wheezing or shortness of breath.  Dispense: 18 g; Refill: 1 - Novel Coronavirus, NAA ([facility]); Future  2. Pulmonary emphysema, unspecified emphysema type (HCC) - albuterol (VENTOLIN HFA) 108 (90 Base) MCG/ACT inhaler; Inhale 2 puffs into the lungs every 4 (four) hours as needed for wheezing or shortness of breath.  Dispense: 18 g; Refill: 1   Start time: 10:10am End time: 10:20am  Total time spent on patient care (including telephone call/ virtual visit): 20 minutes  [place], [place] [phone]

## 2019-12-23 NOTE — Patient Instructions (Signed)

## 2019-12-25 ENCOUNTER — Encounter: Payer: Self-pay | Admitting: Family Medicine

## 2019-12-25 ENCOUNTER — Ambulatory Visit: Payer: Medicare Other

## 2019-12-25 ENCOUNTER — Ambulatory Visit (INDEPENDENT_AMBULATORY_CARE_PROVIDER_SITE_OTHER): Payer: Medicare Other | Admitting: Family Medicine

## 2019-12-25 DIAGNOSIS — I63132 Cerebral infarction due to embolism of left carotid artery: Secondary | ICD-10-CM | POA: Diagnosis present

## 2019-12-25 DIAGNOSIS — J449 Chronic obstructive pulmonary disease, unspecified: Secondary | ICD-10-CM | POA: Diagnosis present

## 2019-12-25 DIAGNOSIS — R040 Epistaxis: Secondary | ICD-10-CM | POA: Diagnosis not present

## 2019-12-25 DIAGNOSIS — U071 COVID-19: Secondary | ICD-10-CM | POA: Insufficient documentation

## 2019-12-25 DIAGNOSIS — R0689 Other abnormalities of breathing: Secondary | ICD-10-CM | POA: Diagnosis not present

## 2019-12-25 DIAGNOSIS — R05 Cough: Secondary | ICD-10-CM | POA: Diagnosis not present

## 2019-12-25 DIAGNOSIS — I63412 Cerebral infarction due to embolism of left middle cerebral artery: Secondary | ICD-10-CM | POA: Diagnosis present

## 2019-12-25 DIAGNOSIS — H905 Unspecified sensorineural hearing loss: Secondary | ICD-10-CM | POA: Diagnosis present

## 2019-12-25 DIAGNOSIS — I6389 Other cerebral infarction: Secondary | ICD-10-CM | POA: Diagnosis not present

## 2019-12-25 DIAGNOSIS — G936 Cerebral edema: Secondary | ICD-10-CM | POA: Diagnosis present

## 2019-12-25 DIAGNOSIS — J189 Pneumonia, unspecified organism: Secondary | ICD-10-CM | POA: Diagnosis not present

## 2019-12-25 DIAGNOSIS — I63512 Cerebral infarction due to unspecified occlusion or stenosis of left middle cerebral artery: Secondary | ICD-10-CM | POA: Insufficient documentation

## 2019-12-25 DIAGNOSIS — H53461 Homonymous bilateral field defects, right side: Secondary | ICD-10-CM | POA: Diagnosis present

## 2019-12-25 DIAGNOSIS — R29717 NIHSS score 17: Secondary | ICD-10-CM | POA: Diagnosis present

## 2019-12-25 DIAGNOSIS — J9601 Acute respiratory failure with hypoxia: Secondary | ICD-10-CM | POA: Diagnosis present

## 2019-12-25 DIAGNOSIS — R918 Other nonspecific abnormal finding of lung field: Secondary | ICD-10-CM | POA: Diagnosis not present

## 2019-12-25 DIAGNOSIS — R4701 Aphasia: Secondary | ICD-10-CM | POA: Diagnosis present

## 2019-12-25 DIAGNOSIS — R29818 Other symptoms and signs involving the nervous system: Secondary | ICD-10-CM | POA: Diagnosis not present

## 2019-12-25 DIAGNOSIS — J441 Chronic obstructive pulmonary disease with (acute) exacerbation: Secondary | ICD-10-CM

## 2019-12-25 DIAGNOSIS — K5900 Constipation, unspecified: Secondary | ICD-10-CM | POA: Diagnosis not present

## 2019-12-25 DIAGNOSIS — I6522 Occlusion and stenosis of left carotid artery: Secondary | ICD-10-CM | POA: Diagnosis not present

## 2019-12-25 DIAGNOSIS — R1312 Dysphagia, oropharyngeal phase: Secondary | ICD-10-CM | POA: Diagnosis present

## 2019-12-25 DIAGNOSIS — Z9282 Status post administration of tPA (rtPA) in a different facility within the last 24 hours prior to admission to current facility: Secondary | ICD-10-CM | POA: Diagnosis not present

## 2019-12-25 DIAGNOSIS — I447 Left bundle-branch block, unspecified: Secondary | ICD-10-CM | POA: Diagnosis present

## 2019-12-25 DIAGNOSIS — Z743 Need for continuous supervision: Secondary | ICD-10-CM | POA: Diagnosis not present

## 2019-12-25 DIAGNOSIS — J984 Other disorders of lung: Secondary | ICD-10-CM | POA: Diagnosis not present

## 2019-12-25 DIAGNOSIS — R1310 Dysphagia, unspecified: Secondary | ICD-10-CM | POA: Diagnosis not present

## 2019-12-25 DIAGNOSIS — Z4682 Encounter for fitting and adjustment of non-vascular catheter: Secondary | ICD-10-CM | POA: Diagnosis not present

## 2019-12-25 DIAGNOSIS — R7989 Other specified abnormal findings of blood chemistry: Secondary | ICD-10-CM | POA: Diagnosis not present

## 2019-12-25 DIAGNOSIS — Z8673 Personal history of transient ischemic attack (TIA), and cerebral infarction without residual deficits: Secondary | ICD-10-CM | POA: Diagnosis not present

## 2019-12-25 DIAGNOSIS — R0902 Hypoxemia: Secondary | ICD-10-CM | POA: Diagnosis not present

## 2019-12-25 DIAGNOSIS — Z880 Allergy status to penicillin: Secondary | ICD-10-CM | POA: Diagnosis not present

## 2019-12-25 DIAGNOSIS — G8191 Hemiplegia, unspecified affecting right dominant side: Secondary | ICD-10-CM | POA: Diagnosis present

## 2019-12-25 DIAGNOSIS — F419 Anxiety disorder, unspecified: Secondary | ICD-10-CM | POA: Diagnosis not present

## 2019-12-25 DIAGNOSIS — R29810 Facial weakness: Secondary | ICD-10-CM | POA: Diagnosis not present

## 2019-12-25 DIAGNOSIS — Z87891 Personal history of nicotine dependence: Secondary | ICD-10-CM | POA: Diagnosis not present

## 2019-12-25 DIAGNOSIS — R1319 Other dysphagia: Secondary | ICD-10-CM | POA: Diagnosis not present

## 2019-12-25 DIAGNOSIS — J1282 Pneumonia due to coronavirus disease 2019: Secondary | ICD-10-CM | POA: Diagnosis present

## 2019-12-25 DIAGNOSIS — R945 Abnormal results of liver function studies: Secondary | ICD-10-CM | POA: Diagnosis present

## 2019-12-25 DIAGNOSIS — D649 Anemia, unspecified: Secondary | ICD-10-CM | POA: Diagnosis present

## 2019-12-25 DIAGNOSIS — G459 Transient cerebral ischemic attack, unspecified: Secondary | ICD-10-CM | POA: Diagnosis not present

## 2019-12-25 DIAGNOSIS — I69391 Dysphagia following cerebral infarction: Secondary | ICD-10-CM | POA: Diagnosis not present

## 2019-12-25 DIAGNOSIS — I63232 Cerebral infarction due to unspecified occlusion or stenosis of left carotid arteries: Secondary | ICD-10-CM | POA: Diagnosis not present

## 2019-12-25 DIAGNOSIS — R404 Transient alteration of awareness: Secondary | ICD-10-CM | POA: Diagnosis not present

## 2019-12-25 DIAGNOSIS — I083 Combined rheumatic disorders of mitral, aortic and tricuspid valves: Secondary | ICD-10-CM | POA: Diagnosis not present

## 2019-12-25 DIAGNOSIS — D72829 Elevated white blood cell count, unspecified: Secondary | ICD-10-CM | POA: Diagnosis not present

## 2019-12-25 DIAGNOSIS — R4182 Altered mental status, unspecified: Secondary | ICD-10-CM | POA: Diagnosis not present

## 2019-12-25 DIAGNOSIS — K219 Gastro-esophageal reflux disease without esophagitis: Secondary | ICD-10-CM | POA: Diagnosis present

## 2019-12-25 DIAGNOSIS — I639 Cerebral infarction, unspecified: Secondary | ICD-10-CM | POA: Diagnosis not present

## 2019-12-25 NOTE — Progress Notes (Signed)
Virtual Visit via telephone Note  I connected with Craig Hurley on 12/25/19 at 0857 by telephone and verified that I am speaking with the correct person using two identifiers. Craig Hurley is currently located at home and wife are currently with her during visit. The provider, Craig Kaufmann Lyrica Mcclarty, MD is located in their office at time of visit.  Call ended at 0912  I discussed the limitations, risks, security and privacy concerns of performing an evaluation and management service by telephone and the availability of in person appointments. I also discussed with the patient that there may be a patient responsible charge related to this service. The patient expressed understanding and agreed to proceed.   History and Present Illness: Patient started with cough and congestion and shortness of breath and wheezing that has been going on for 2 weeks and has been worsening over the past week despite being on doxycycline and prednisone that he started 2 days. He denies any sick contacts.  Sounds like patient is getting worse despite being on the proper treatment for a COPD exacerbation including Advair and Incruse and albuterol rescue inhaler which he is using every 2 hours and prednisone and doxycycline and he does not feel like he is showing any improvement.  He is also taken [employer]  No diagnosis found.  Outpatient Encounter Medications as of 12/25/2019  Medication Sig  . acetaminophen (TYLENOL) 325 MG tablet Take 650 mg by mouth every 6 (six) hours as needed.  Marland Kitchen albuterol (VENTOLIN HFA) 108 (90 Base) MCG/ACT inhaler Inhale 2 puffs into the lungs every 4 (four) hours as needed for wheezing or shortness of breath.  . benzonatate (TESSALON PERLES) 100 MG capsule Take 1 capsule (100 mg total) by mouth 3 (three) times daily as needed for cough.  . cetirizine (ZYRTEC) 10 MG tablet Take 1 tablet (10 mg total) by mouth daily.  Marland Kitchen doxazosin (CARDURA) 4 MG tablet Take 1 tablet by mouth once  daily  . doxycycline (VIBRA-TABS) 100 MG tablet Take 1 tablet (100 mg total) by mouth 2 (two) times daily.  . Fluticasone-Salmeterol (ADVAIR) 250-50 MCG/DOSE AEPB INHALE 1 DOSE BY MOUTH TWICE DAILY  . meloxicam (MOBIC) 15 MG tablet Take 1 tablet by mouth once daily  . montelukast (SINGULAIR) 10 MG tablet TAKE 1 TABLET BY MOUTH AT BEDTIME  . Multiple Vitamins-Minerals (EYE VITAMINS PO) Take by mouth.  Marland Kitchen omeprazole (PRILOSEC) 20 MG capsule TAKE 1 CAPSULE BY MOUTH TWICE DAILY BEFORE MEAL(S)  . predniSONE (DELTASONE) 20 MG tablet Take 2 tablets (40 mg total) by mouth daily with breakfast for 5 days.  Marland Kitchen tiotropium (SPIRIVA HANDIHALER) 18 MCG inhalation capsule Place 1 capsule (18 mcg total) into inhaler and inhale daily.  . vitamin A 8000 UNIT capsule Take 8,000 Units by mouth daily.   No facility-administered encounter medications on file as of 12/25/2019.    Review of Systems  Constitutional: Negative for chills and fever.  Respiratory: Negative for shortness of breath and wheezing.   Cardiovascular: Negative for chest pain and leg swelling.  Musculoskeletal: Negative for back pain and gait problem.  Skin: Negative for rash.  Neurological: Negative for dizziness, weakness and numbness.  All other systems reviewed and are negative.   Observations/Objective: Patient is coughing significantly and had his wife talk because he felt like he would get short of breath  Assessment and Plan: Problem List Items Addressed This Visit    None    Visit Diagnoses    COPD with acute exacerbation ([place])    -  Primary      He is not doing better on outpatient treatment and he is still currently on our treatments, will send for evaluation at the respiratory clinic and hopefully get him tested for Covid and possibly get a chest x-ray and a pulse ox Follow up plan: Return if symptoms worsen or fail to improve.     I discussed the assessment and treatment plan with the patient. The patient was provided  an opportunity to ask questions and all were answered. The patient agreed with the plan and demonstrated an understanding of the instructions.   The patient was advised to call back or seek an in-person evaluation if the symptoms worsen or if the condition fails to improve as anticipated.  The above assessment and management plan was discussed with the patient. The patient verbalized understanding of and has agreed to the management plan. Patient is aware to call the clinic if symptoms persist or worsen. Patient is aware when to return to the clinic for a follow-up visit. Patient educated on when it is appropriate to go to the emergency department.    I provided 15 minutes of non-face-to-face time during this encounter.    Worthy Rancher, MD

## 2019-12-26 ENCOUNTER — Ambulatory Visit: Payer: Medicare Other | Admitting: Family Medicine

## 2019-12-26 DIAGNOSIS — I639 Cerebral infarction, unspecified: Secondary | ICD-10-CM | POA: Diagnosis not present

## 2019-12-26 DIAGNOSIS — R7989 Other specified abnormal findings of blood chemistry: Secondary | ICD-10-CM | POA: Diagnosis not present

## 2019-12-26 DIAGNOSIS — R918 Other nonspecific abnormal finding of lung field: Secondary | ICD-10-CM | POA: Diagnosis not present

## 2019-12-26 DIAGNOSIS — Z4682 Encounter for fitting and adjustment of non-vascular catheter: Secondary | ICD-10-CM | POA: Diagnosis not present

## 2019-12-26 DIAGNOSIS — I6389 Other cerebral infarction: Secondary | ICD-10-CM | POA: Diagnosis not present

## 2019-12-26 DIAGNOSIS — R1310 Dysphagia, unspecified: Secondary | ICD-10-CM | POA: Diagnosis not present

## 2019-12-26 DIAGNOSIS — I6522 Occlusion and stenosis of left carotid artery: Secondary | ICD-10-CM | POA: Diagnosis not present

## 2019-12-26 DIAGNOSIS — J9601 Acute respiratory failure with hypoxia: Secondary | ICD-10-CM | POA: Diagnosis not present

## 2019-12-26 DIAGNOSIS — U071 COVID-19: Secondary | ICD-10-CM | POA: Diagnosis not present

## 2019-12-26 DIAGNOSIS — I083 Combined rheumatic disorders of mitral, aortic and tricuspid valves: Secondary | ICD-10-CM | POA: Diagnosis not present

## 2019-12-27 DIAGNOSIS — R1310 Dysphagia, unspecified: Secondary | ICD-10-CM | POA: Diagnosis not present

## 2019-12-27 DIAGNOSIS — U071 COVID-19: Secondary | ICD-10-CM | POA: Diagnosis not present

## 2019-12-27 DIAGNOSIS — J9601 Acute respiratory failure with hypoxia: Secondary | ICD-10-CM | POA: Diagnosis not present

## 2019-12-27 DIAGNOSIS — R7989 Other specified abnormal findings of blood chemistry: Secondary | ICD-10-CM | POA: Diagnosis not present

## 2019-12-27 DIAGNOSIS — I6522 Occlusion and stenosis of left carotid artery: Secondary | ICD-10-CM | POA: Diagnosis not present

## 2019-12-28 DIAGNOSIS — U071 COVID-19: Secondary | ICD-10-CM | POA: Diagnosis not present

## 2019-12-28 DIAGNOSIS — I6522 Occlusion and stenosis of left carotid artery: Secondary | ICD-10-CM | POA: Diagnosis not present

## 2019-12-28 DIAGNOSIS — J9601 Acute respiratory failure with hypoxia: Secondary | ICD-10-CM | POA: Diagnosis not present

## 2019-12-28 DIAGNOSIS — R1310 Dysphagia, unspecified: Secondary | ICD-10-CM | POA: Diagnosis not present

## 2019-12-28 DIAGNOSIS — R7989 Other specified abnormal findings of blood chemistry: Secondary | ICD-10-CM | POA: Diagnosis not present

## 2019-12-28 DIAGNOSIS — J189 Pneumonia, unspecified organism: Secondary | ICD-10-CM | POA: Diagnosis not present

## 2019-12-29 DIAGNOSIS — R1310 Dysphagia, unspecified: Secondary | ICD-10-CM | POA: Diagnosis not present

## 2019-12-29 DIAGNOSIS — Z8673 Personal history of transient ischemic attack (TIA), and cerebral infarction without residual deficits: Secondary | ICD-10-CM | POA: Diagnosis not present

## 2019-12-29 DIAGNOSIS — I63412 Cerebral infarction due to embolism of left middle cerebral artery: Secondary | ICD-10-CM | POA: Diagnosis not present

## 2019-12-29 DIAGNOSIS — J9601 Acute respiratory failure with hypoxia: Secondary | ICD-10-CM | POA: Diagnosis not present

## 2019-12-29 DIAGNOSIS — U071 COVID-19: Secondary | ICD-10-CM | POA: Diagnosis not present

## 2019-12-29 MED ORDER — MONTELUKAST SODIUM 10 MG PO TABS
10.00 | ORAL_TABLET | ORAL | Status: DC
Start: 2019-12-29 — End: 2019-12-29

## 2019-12-29 MED ORDER — PANTOPRAZOLE SODIUM 40 MG IV SOLR
40.00 | INTRAVENOUS | Status: DC
Start: 2019-12-30 — End: 2019-12-29

## 2019-12-29 MED ORDER — ACETAMINOPHEN 650 MG RE SUPP
650.00 | RECTAL | Status: DC
Start: ? — End: 2019-12-29

## 2019-12-29 MED ORDER — UMECLIDINIUM BROMIDE 62.5 MCG/INH IN AEPB
1.00 | INHALATION_SPRAY | RESPIRATORY_TRACT | Status: DC
Start: 2019-12-30 — End: 2019-12-29

## 2019-12-29 MED ORDER — DEXAMETHASONE SODIUM PHOSPHATE 10 MG/ML IJ SOLN
10.00 | INTRAMUSCULAR | Status: DC
Start: 2019-12-30 — End: 2019-12-29

## 2019-12-29 MED ORDER — DOXAZOSIN MESYLATE 4 MG PO TABS
4.00 | ORAL_TABLET | ORAL | Status: DC
Start: 2019-12-29 — End: 2019-12-29

## 2019-12-29 MED ORDER — ATORVASTATIN CALCIUM 20 MG PO TABS
10.00 | ORAL_TABLET | ORAL | Status: DC
Start: 2019-12-29 — End: 2019-12-29

## 2019-12-29 MED ORDER — GENERIC EXTERNAL MEDICATION
100.00 | Status: DC
Start: 2019-12-30 — End: 2019-12-29

## 2019-12-29 MED ORDER — BUDESONIDE-FORMOTEROL FUMARATE 80-4.5 MCG/ACT IN AERO
2.00 | INHALATION_SPRAY | RESPIRATORY_TRACT | Status: DC
Start: 2019-12-29 — End: 2019-12-29

## 2019-12-29 MED ORDER — GENERIC EXTERNAL MEDICATION
Status: DC
Start: ? — End: 2019-12-29

## 2019-12-29 MED ORDER — MUPIROCIN 2 % EX OINT
TOPICAL_OINTMENT | CUTANEOUS | Status: DC
Start: 2019-12-29 — End: 2019-12-29

## 2019-12-29 MED ORDER — HYDROXYZINE PAMOATE 25 MG PO CAPS
25.00 | ORAL_CAPSULE | ORAL | Status: DC
Start: ? — End: 2019-12-29

## 2019-12-29 MED ORDER — ONDANSETRON HCL 4 MG/2ML IJ SOLN
4.00 | INTRAMUSCULAR | Status: DC
Start: ? — End: 2019-12-29

## 2019-12-29 MED ORDER — SODIUM CHLORIDE 0.9 % IV SOLN
10.00 | INTRAVENOUS | Status: DC
Start: ? — End: 2019-12-29

## 2019-12-29 MED ORDER — LABETALOL HCL 5 MG/ML IV SOLN
10.00 | INTRAVENOUS | Status: DC
Start: ? — End: 2019-12-29

## 2019-12-30 DIAGNOSIS — U071 COVID-19: Secondary | ICD-10-CM | POA: Diagnosis not present

## 2019-12-30 DIAGNOSIS — I63412 Cerebral infarction due to embolism of left middle cerebral artery: Secondary | ICD-10-CM | POA: Diagnosis not present

## 2019-12-30 DIAGNOSIS — Z8673 Personal history of transient ischemic attack (TIA), and cerebral infarction without residual deficits: Secondary | ICD-10-CM | POA: Diagnosis not present

## 2019-12-30 DIAGNOSIS — J189 Pneumonia, unspecified organism: Secondary | ICD-10-CM | POA: Diagnosis not present

## 2019-12-30 DIAGNOSIS — J9601 Acute respiratory failure with hypoxia: Secondary | ICD-10-CM | POA: Diagnosis not present

## 2019-12-30 DIAGNOSIS — R1310 Dysphagia, unspecified: Secondary | ICD-10-CM | POA: Diagnosis not present

## 2019-12-30 DIAGNOSIS — J984 Other disorders of lung: Secondary | ICD-10-CM | POA: Diagnosis not present

## 2019-12-31 DIAGNOSIS — R1310 Dysphagia, unspecified: Secondary | ICD-10-CM | POA: Diagnosis not present

## 2019-12-31 DIAGNOSIS — I63412 Cerebral infarction due to embolism of left middle cerebral artery: Secondary | ICD-10-CM | POA: Diagnosis not present

## 2019-12-31 DIAGNOSIS — Z8673 Personal history of transient ischemic attack (TIA), and cerebral infarction without residual deficits: Secondary | ICD-10-CM | POA: Diagnosis not present

## 2019-12-31 DIAGNOSIS — U071 COVID-19: Secondary | ICD-10-CM | POA: Diagnosis not present

## 2019-12-31 DIAGNOSIS — J9601 Acute respiratory failure with hypoxia: Secondary | ICD-10-CM | POA: Diagnosis not present

## 2019-12-31 DIAGNOSIS — J189 Pneumonia, unspecified organism: Secondary | ICD-10-CM | POA: Diagnosis not present

## 2020-01-01 DIAGNOSIS — R040 Epistaxis: Secondary | ICD-10-CM | POA: Diagnosis not present

## 2020-01-01 DIAGNOSIS — J9601 Acute respiratory failure with hypoxia: Secondary | ICD-10-CM | POA: Insufficient documentation

## 2020-01-01 DIAGNOSIS — R1310 Dysphagia, unspecified: Secondary | ICD-10-CM | POA: Insufficient documentation

## 2020-01-01 DIAGNOSIS — I63412 Cerebral infarction due to embolism of left middle cerebral artery: Secondary | ICD-10-CM | POA: Diagnosis not present

## 2020-01-01 DIAGNOSIS — I6522 Occlusion and stenosis of left carotid artery: Secondary | ICD-10-CM | POA: Diagnosis not present

## 2020-01-01 DIAGNOSIS — R1319 Other dysphagia: Secondary | ICD-10-CM | POA: Diagnosis not present

## 2020-01-01 DIAGNOSIS — I639 Cerebral infarction, unspecified: Secondary | ICD-10-CM | POA: Diagnosis not present

## 2020-01-01 DIAGNOSIS — I63512 Cerebral infarction due to unspecified occlusion or stenosis of left middle cerebral artery: Secondary | ICD-10-CM | POA: Diagnosis not present

## 2020-01-01 DIAGNOSIS — G459 Transient cerebral ischemic attack, unspecified: Secondary | ICD-10-CM | POA: Diagnosis not present

## 2020-01-01 DIAGNOSIS — U071 COVID-19: Secondary | ICD-10-CM | POA: Diagnosis not present

## 2020-01-02 DIAGNOSIS — I6522 Occlusion and stenosis of left carotid artery: Secondary | ICD-10-CM | POA: Diagnosis not present

## 2020-01-02 DIAGNOSIS — I63412 Cerebral infarction due to embolism of left middle cerebral artery: Secondary | ICD-10-CM | POA: Diagnosis not present

## 2020-01-02 DIAGNOSIS — I63512 Cerebral infarction due to unspecified occlusion or stenosis of left middle cerebral artery: Secondary | ICD-10-CM | POA: Diagnosis not present

## 2020-01-02 DIAGNOSIS — R1319 Other dysphagia: Secondary | ICD-10-CM | POA: Diagnosis not present

## 2020-01-02 DIAGNOSIS — J9601 Acute respiratory failure with hypoxia: Secondary | ICD-10-CM | POA: Diagnosis not present

## 2020-01-02 DIAGNOSIS — R040 Epistaxis: Secondary | ICD-10-CM | POA: Diagnosis not present

## 2020-01-02 DIAGNOSIS — R1310 Dysphagia, unspecified: Secondary | ICD-10-CM | POA: Diagnosis not present

## 2020-01-02 DIAGNOSIS — U071 COVID-19: Secondary | ICD-10-CM | POA: Diagnosis not present

## 2020-01-03 DIAGNOSIS — D649 Anemia, unspecified: Secondary | ICD-10-CM | POA: Insufficient documentation

## 2020-01-03 DIAGNOSIS — D72829 Elevated white blood cell count, unspecified: Secondary | ICD-10-CM | POA: Insufficient documentation

## 2020-01-03 DIAGNOSIS — J1282 Pneumonia due to coronavirus disease 2019: Secondary | ICD-10-CM | POA: Insufficient documentation

## 2020-01-03 DIAGNOSIS — R1310 Dysphagia, unspecified: Secondary | ICD-10-CM | POA: Diagnosis not present

## 2020-01-03 DIAGNOSIS — K5900 Constipation, unspecified: Secondary | ICD-10-CM | POA: Diagnosis not present

## 2020-01-03 DIAGNOSIS — R7989 Other specified abnormal findings of blood chemistry: Secondary | ICD-10-CM | POA: Insufficient documentation

## 2020-01-03 DIAGNOSIS — I63512 Cerebral infarction due to unspecified occlusion or stenosis of left middle cerebral artery: Secondary | ICD-10-CM | POA: Diagnosis not present

## 2020-01-03 DIAGNOSIS — U071 COVID-19: Secondary | ICD-10-CM | POA: Insufficient documentation

## 2020-01-03 DIAGNOSIS — R1319 Other dysphagia: Secondary | ICD-10-CM | POA: Diagnosis not present

## 2020-01-03 DIAGNOSIS — J9601 Acute respiratory failure with hypoxia: Secondary | ICD-10-CM | POA: Diagnosis not present

## 2020-01-03 DIAGNOSIS — I6522 Occlusion and stenosis of left carotid artery: Secondary | ICD-10-CM | POA: Diagnosis not present

## 2020-01-03 DIAGNOSIS — R040 Epistaxis: Secondary | ICD-10-CM | POA: Diagnosis not present

## 2020-01-03 DIAGNOSIS — I63412 Cerebral infarction due to embolism of left middle cerebral artery: Secondary | ICD-10-CM | POA: Diagnosis not present

## 2020-01-04 DIAGNOSIS — R1319 Other dysphagia: Secondary | ICD-10-CM | POA: Diagnosis not present

## 2020-01-04 DIAGNOSIS — J9601 Acute respiratory failure with hypoxia: Secondary | ICD-10-CM | POA: Diagnosis not present

## 2020-01-04 DIAGNOSIS — I63412 Cerebral infarction due to embolism of left middle cerebral artery: Secondary | ICD-10-CM | POA: Diagnosis not present

## 2020-01-04 DIAGNOSIS — I63512 Cerebral infarction due to unspecified occlusion or stenosis of left middle cerebral artery: Secondary | ICD-10-CM | POA: Diagnosis not present

## 2020-01-04 DIAGNOSIS — R040 Epistaxis: Secondary | ICD-10-CM | POA: Diagnosis not present

## 2020-01-04 DIAGNOSIS — U071 COVID-19: Secondary | ICD-10-CM | POA: Diagnosis not present

## 2020-01-04 DIAGNOSIS — R1310 Dysphagia, unspecified: Secondary | ICD-10-CM | POA: Diagnosis not present

## 2020-01-04 DIAGNOSIS — I6522 Occlusion and stenosis of left carotid artery: Secondary | ICD-10-CM | POA: Diagnosis not present

## 2020-01-05 DIAGNOSIS — U071 COVID-19: Secondary | ICD-10-CM | POA: Diagnosis not present

## 2020-01-05 DIAGNOSIS — I63512 Cerebral infarction due to unspecified occlusion or stenosis of left middle cerebral artery: Secondary | ICD-10-CM | POA: Diagnosis not present

## 2020-01-05 DIAGNOSIS — R1310 Dysphagia, unspecified: Secondary | ICD-10-CM | POA: Diagnosis not present

## 2020-01-05 DIAGNOSIS — R1319 Other dysphagia: Secondary | ICD-10-CM | POA: Diagnosis not present

## 2020-01-05 DIAGNOSIS — J9601 Acute respiratory failure with hypoxia: Secondary | ICD-10-CM | POA: Diagnosis not present

## 2020-01-05 DIAGNOSIS — I63412 Cerebral infarction due to embolism of left middle cerebral artery: Secondary | ICD-10-CM | POA: Diagnosis not present

## 2020-01-05 DIAGNOSIS — I6522 Occlusion and stenosis of left carotid artery: Secondary | ICD-10-CM | POA: Diagnosis not present

## 2020-01-05 DIAGNOSIS — R040 Epistaxis: Secondary | ICD-10-CM | POA: Diagnosis not present

## 2020-01-06 DIAGNOSIS — U071 COVID-19: Secondary | ICD-10-CM | POA: Diagnosis not present

## 2020-01-06 DIAGNOSIS — J9601 Acute respiratory failure with hypoxia: Secondary | ICD-10-CM | POA: Diagnosis not present

## 2020-01-06 DIAGNOSIS — I6522 Occlusion and stenosis of left carotid artery: Secondary | ICD-10-CM | POA: Diagnosis not present

## 2020-01-06 DIAGNOSIS — I63412 Cerebral infarction due to embolism of left middle cerebral artery: Secondary | ICD-10-CM | POA: Diagnosis not present

## 2020-01-06 DIAGNOSIS — I63512 Cerebral infarction due to unspecified occlusion or stenosis of left middle cerebral artery: Secondary | ICD-10-CM | POA: Diagnosis not present

## 2020-01-06 DIAGNOSIS — J1282 Pneumonia due to Coronavirus disease 2019: Secondary | ICD-10-CM | POA: Diagnosis not present

## 2020-01-06 DIAGNOSIS — R1310 Dysphagia, unspecified: Secondary | ICD-10-CM | POA: Diagnosis not present

## 2020-01-09 DIAGNOSIS — I69351 Hemiplegia and hemiparesis following cerebral infarction affecting right dominant side: Secondary | ICD-10-CM | POA: Diagnosis not present

## 2020-01-09 DIAGNOSIS — I69391 Dysphagia following cerebral infarction: Secondary | ICD-10-CM | POA: Diagnosis not present

## 2020-01-09 DIAGNOSIS — R040 Epistaxis: Secondary | ICD-10-CM | POA: Diagnosis not present

## 2020-01-09 DIAGNOSIS — H905 Unspecified sensorineural hearing loss: Secondary | ICD-10-CM | POA: Diagnosis not present

## 2020-01-09 DIAGNOSIS — M2550 Pain in unspecified joint: Secondary | ICD-10-CM | POA: Diagnosis not present

## 2020-01-09 DIAGNOSIS — D72829 Elevated white blood cell count, unspecified: Secondary | ICD-10-CM | POA: Diagnosis not present

## 2020-01-09 DIAGNOSIS — R1312 Dysphagia, oropharyngeal phase: Secondary | ICD-10-CM | POA: Diagnosis not present

## 2020-01-09 DIAGNOSIS — J9601 Acute respiratory failure with hypoxia: Secondary | ICD-10-CM | POA: Diagnosis not present

## 2020-01-09 DIAGNOSIS — N390 Urinary tract infection, site not specified: Secondary | ICD-10-CM | POA: Diagnosis not present

## 2020-01-09 DIAGNOSIS — I63512 Cerebral infarction due to unspecified occlusion or stenosis of left middle cerebral artery: Secondary | ICD-10-CM | POA: Diagnosis not present

## 2020-01-09 DIAGNOSIS — R0982 Postnasal drip: Secondary | ICD-10-CM | POA: Diagnosis not present

## 2020-01-09 DIAGNOSIS — U071 COVID-19: Secondary | ICD-10-CM | POA: Diagnosis not present

## 2020-01-09 DIAGNOSIS — R05 Cough: Secondary | ICD-10-CM | POA: Diagnosis not present

## 2020-01-09 DIAGNOSIS — I6522 Occlusion and stenosis of left carotid artery: Secondary | ICD-10-CM | POA: Diagnosis not present

## 2020-01-09 DIAGNOSIS — R531 Weakness: Secondary | ICD-10-CM | POA: Diagnosis not present

## 2020-01-09 DIAGNOSIS — R918 Other nonspecific abnormal finding of lung field: Secondary | ICD-10-CM | POA: Diagnosis not present

## 2020-01-09 DIAGNOSIS — N400 Enlarged prostate without lower urinary tract symptoms: Secondary | ICD-10-CM | POA: Diagnosis not present

## 2020-01-09 DIAGNOSIS — J984 Other disorders of lung: Secondary | ICD-10-CM | POA: Diagnosis not present

## 2020-01-09 DIAGNOSIS — R1310 Dysphagia, unspecified: Secondary | ICD-10-CM | POA: Diagnosis not present

## 2020-01-09 DIAGNOSIS — J1282 Pneumonia due to coronavirus disease 2019: Secondary | ICD-10-CM | POA: Diagnosis not present

## 2020-01-09 DIAGNOSIS — I63412 Cerebral infarction due to embolism of left middle cerebral artery: Secondary | ICD-10-CM | POA: Diagnosis not present

## 2020-01-09 DIAGNOSIS — R1319 Other dysphagia: Secondary | ICD-10-CM | POA: Diagnosis not present

## 2020-01-09 DIAGNOSIS — E1159 Type 2 diabetes mellitus with other circulatory complications: Secondary | ICD-10-CM | POA: Diagnosis not present

## 2020-01-09 DIAGNOSIS — E785 Hyperlipidemia, unspecified: Secondary | ICD-10-CM | POA: Diagnosis not present

## 2020-01-09 DIAGNOSIS — E7800 Pure hypercholesterolemia, unspecified: Secondary | ICD-10-CM | POA: Diagnosis not present

## 2020-01-09 DIAGNOSIS — I69392 Facial weakness following cerebral infarction: Secondary | ICD-10-CM | POA: Diagnosis not present

## 2020-01-09 DIAGNOSIS — G459 Transient cerebral ischemic attack, unspecified: Secondary | ICD-10-CM | POA: Diagnosis not present

## 2020-01-09 DIAGNOSIS — D649 Anemia, unspecified: Secondary | ICD-10-CM | POA: Diagnosis not present

## 2020-01-09 DIAGNOSIS — I69320 Aphasia following cerebral infarction: Secondary | ICD-10-CM | POA: Diagnosis not present

## 2020-01-09 DIAGNOSIS — J449 Chronic obstructive pulmonary disease, unspecified: Secondary | ICD-10-CM | POA: Diagnosis not present

## 2020-01-09 DIAGNOSIS — Z7401 Bed confinement status: Secondary | ICD-10-CM | POA: Diagnosis not present

## 2020-01-14 ENCOUNTER — Telehealth: Payer: Self-pay | Admitting: Family Medicine

## 2020-01-14 NOTE — Telephone Encounter (Signed)
Appt scheduled

## 2020-01-19 ENCOUNTER — Telehealth: Payer: Self-pay | Admitting: Family Medicine

## 2020-01-19 DIAGNOSIS — J449 Chronic obstructive pulmonary disease, unspecified: Secondary | ICD-10-CM | POA: Diagnosis not present

## 2020-01-19 DIAGNOSIS — I10 Essential (primary) hypertension: Secondary | ICD-10-CM | POA: Diagnosis not present

## 2020-01-19 DIAGNOSIS — I69320 Aphasia following cerebral infarction: Secondary | ICD-10-CM | POA: Diagnosis not present

## 2020-01-19 DIAGNOSIS — N400 Enlarged prostate without lower urinary tract symptoms: Secondary | ICD-10-CM | POA: Diagnosis not present

## 2020-01-19 DIAGNOSIS — Z8616 Personal history of COVID-19: Secondary | ICD-10-CM | POA: Diagnosis not present

## 2020-01-19 DIAGNOSIS — D649 Anemia, unspecified: Secondary | ICD-10-CM | POA: Diagnosis not present

## 2020-01-19 DIAGNOSIS — I69351 Hemiplegia and hemiparesis following cerebral infarction affecting right dominant side: Secondary | ICD-10-CM | POA: Diagnosis not present

## 2020-01-19 DIAGNOSIS — E1159 Type 2 diabetes mellitus with other circulatory complications: Secondary | ICD-10-CM | POA: Diagnosis not present

## 2020-01-19 DIAGNOSIS — I69391 Dysphagia following cerebral infarction: Secondary | ICD-10-CM | POA: Diagnosis not present

## 2020-01-19 DIAGNOSIS — Z9181 History of falling: Secondary | ICD-10-CM | POA: Diagnosis not present

## 2020-01-19 DIAGNOSIS — R1312 Dysphagia, oropharyngeal phase: Secondary | ICD-10-CM | POA: Diagnosis not present

## 2020-01-19 DIAGNOSIS — I69392 Facial weakness following cerebral infarction: Secondary | ICD-10-CM | POA: Diagnosis not present

## 2020-01-19 DIAGNOSIS — H9190 Unspecified hearing loss, unspecified ear: Secondary | ICD-10-CM | POA: Diagnosis not present

## 2020-01-19 NOTE — Telephone Encounter (Signed)
Okay sounds great to me, do they need a new order for this, if they do we can go ahead and send it for them.

## 2020-01-19 NOTE — Telephone Encounter (Signed)
Laurey Arrow called from [facility] to let Dr Dettinger know that pt was admitted to [facility] today. Laurey Arrow wants to continue physical therapy with him 2x a week for 2 weeks and then 1x a week for 2 weeks.

## 2020-01-19 NOTE — Telephone Encounter (Signed)
Craig Hurley aware and states he can just take a verbal- verbal given.

## 2020-01-20 DIAGNOSIS — R1312 Dysphagia, oropharyngeal phase: Secondary | ICD-10-CM | POA: Diagnosis not present

## 2020-01-20 DIAGNOSIS — E1159 Type 2 diabetes mellitus with other circulatory complications: Secondary | ICD-10-CM | POA: Diagnosis not present

## 2020-01-20 DIAGNOSIS — I10 Essential (primary) hypertension: Secondary | ICD-10-CM | POA: Diagnosis not present

## 2020-01-20 DIAGNOSIS — I69391 Dysphagia following cerebral infarction: Secondary | ICD-10-CM | POA: Diagnosis not present

## 2020-01-20 DIAGNOSIS — I69351 Hemiplegia and hemiparesis following cerebral infarction affecting right dominant side: Secondary | ICD-10-CM | POA: Diagnosis not present

## 2020-01-20 DIAGNOSIS — I69392 Facial weakness following cerebral infarction: Secondary | ICD-10-CM | POA: Diagnosis not present

## 2020-01-21 DIAGNOSIS — I10 Essential (primary) hypertension: Secondary | ICD-10-CM | POA: Diagnosis not present

## 2020-01-21 DIAGNOSIS — I69392 Facial weakness following cerebral infarction: Secondary | ICD-10-CM | POA: Diagnosis not present

## 2020-01-21 DIAGNOSIS — I69391 Dysphagia following cerebral infarction: Secondary | ICD-10-CM | POA: Diagnosis not present

## 2020-01-21 DIAGNOSIS — R1312 Dysphagia, oropharyngeal phase: Secondary | ICD-10-CM | POA: Diagnosis not present

## 2020-01-21 DIAGNOSIS — I69351 Hemiplegia and hemiparesis following cerebral infarction affecting right dominant side: Secondary | ICD-10-CM | POA: Diagnosis not present

## 2020-01-21 DIAGNOSIS — E1159 Type 2 diabetes mellitus with other circulatory complications: Secondary | ICD-10-CM | POA: Diagnosis not present

## 2020-01-26 ENCOUNTER — Other Ambulatory Visit: Payer: Self-pay

## 2020-01-26 DIAGNOSIS — E1159 Type 2 diabetes mellitus with other circulatory complications: Secondary | ICD-10-CM | POA: Diagnosis not present

## 2020-01-26 DIAGNOSIS — I69351 Hemiplegia and hemiparesis following cerebral infarction affecting right dominant side: Secondary | ICD-10-CM | POA: Diagnosis not present

## 2020-01-26 DIAGNOSIS — R1312 Dysphagia, oropharyngeal phase: Secondary | ICD-10-CM | POA: Diagnosis not present

## 2020-01-26 DIAGNOSIS — I10 Essential (primary) hypertension: Secondary | ICD-10-CM | POA: Diagnosis not present

## 2020-01-26 DIAGNOSIS — I69391 Dysphagia following cerebral infarction: Secondary | ICD-10-CM | POA: Diagnosis not present

## 2020-01-26 DIAGNOSIS — I69392 Facial weakness following cerebral infarction: Secondary | ICD-10-CM | POA: Diagnosis not present

## 2020-01-27 ENCOUNTER — Ambulatory Visit (INDEPENDENT_AMBULATORY_CARE_PROVIDER_SITE_OTHER): Payer: Medicare Other | Admitting: Family Medicine

## 2020-01-27 ENCOUNTER — Encounter: Payer: Self-pay | Admitting: Family Medicine

## 2020-01-27 ENCOUNTER — Other Ambulatory Visit: Payer: Self-pay

## 2020-01-27 ENCOUNTER — Ambulatory Visit (INDEPENDENT_AMBULATORY_CARE_PROVIDER_SITE_OTHER): Payer: Medicare Other

## 2020-01-27 VITALS — BP 91/68 | HR 110 | Temp 97.8°F | Ht 70.0 in | Wt 212.0 lb

## 2020-01-27 DIAGNOSIS — I6990 Unspecified sequelae of unspecified cerebrovascular disease: Secondary | ICD-10-CM | POA: Diagnosis not present

## 2020-01-27 DIAGNOSIS — R1312 Dysphagia, oropharyngeal phase: Secondary | ICD-10-CM | POA: Diagnosis not present

## 2020-01-27 DIAGNOSIS — B948 Sequelae of other specified infectious and parasitic diseases: Secondary | ICD-10-CM | POA: Diagnosis not present

## 2020-01-27 DIAGNOSIS — I69351 Hemiplegia and hemiparesis following cerebral infarction affecting right dominant side: Secondary | ICD-10-CM | POA: Diagnosis not present

## 2020-01-27 DIAGNOSIS — J1282 Pneumonia due to coronavirus disease 2019: Secondary | ICD-10-CM | POA: Diagnosis not present

## 2020-01-27 DIAGNOSIS — I69392 Facial weakness following cerebral infarction: Secondary | ICD-10-CM | POA: Diagnosis not present

## 2020-01-27 DIAGNOSIS — E1159 Type 2 diabetes mellitus with other circulatory complications: Secondary | ICD-10-CM | POA: Diagnosis not present

## 2020-01-27 DIAGNOSIS — I69391 Dysphagia following cerebral infarction: Secondary | ICD-10-CM | POA: Diagnosis not present

## 2020-01-27 DIAGNOSIS — U071 COVID-19: Secondary | ICD-10-CM | POA: Diagnosis not present

## 2020-01-27 DIAGNOSIS — R7309 Other abnormal glucose: Secondary | ICD-10-CM | POA: Diagnosis not present

## 2020-01-27 DIAGNOSIS — I10 Essential (primary) hypertension: Secondary | ICD-10-CM | POA: Diagnosis not present

## 2020-01-27 IMAGING — DX DG CHEST 2V
3 series · 3 of 3 positions shown · non-contrast
Comparison: CT chest dated [DATE]. Chest x-ray dated
[DATE].

CLINICAL DATA: COVID pneumonia follow-up.

EXAM:
CHEST - 2 VIEW

[chest pa]
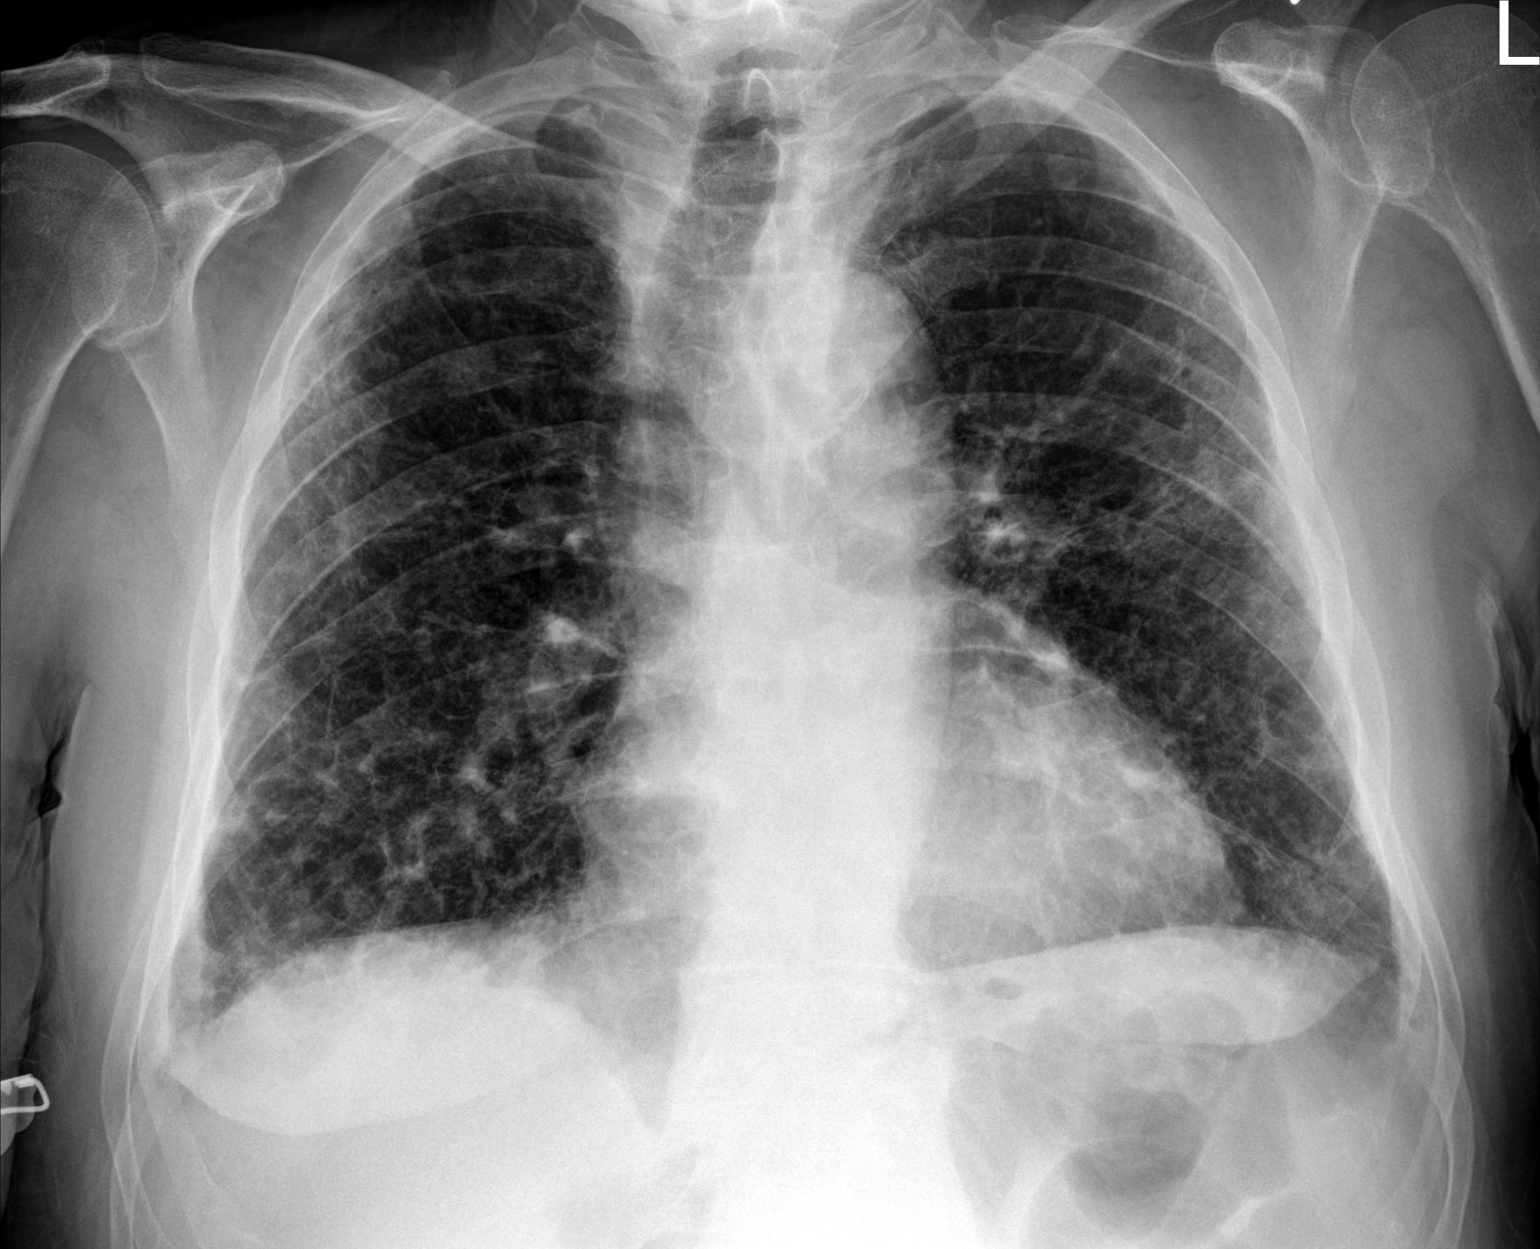

[chest lat (1 of 2)]
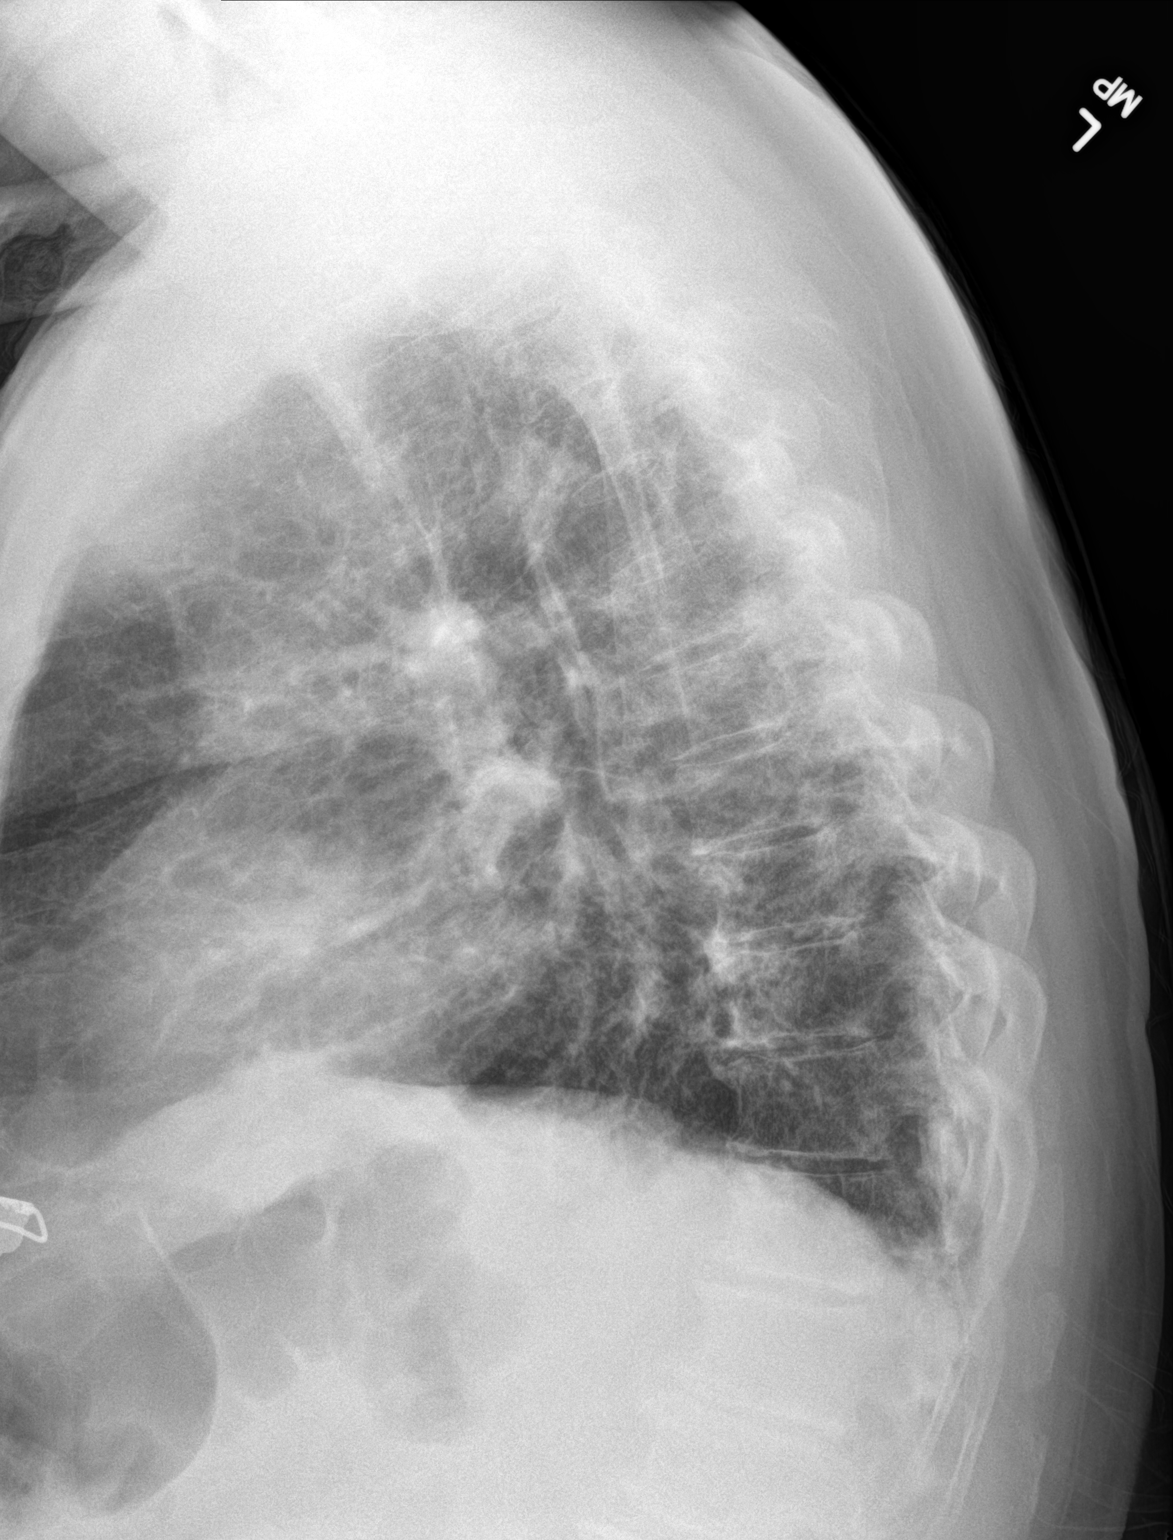

[chest lat (2 of 2)]
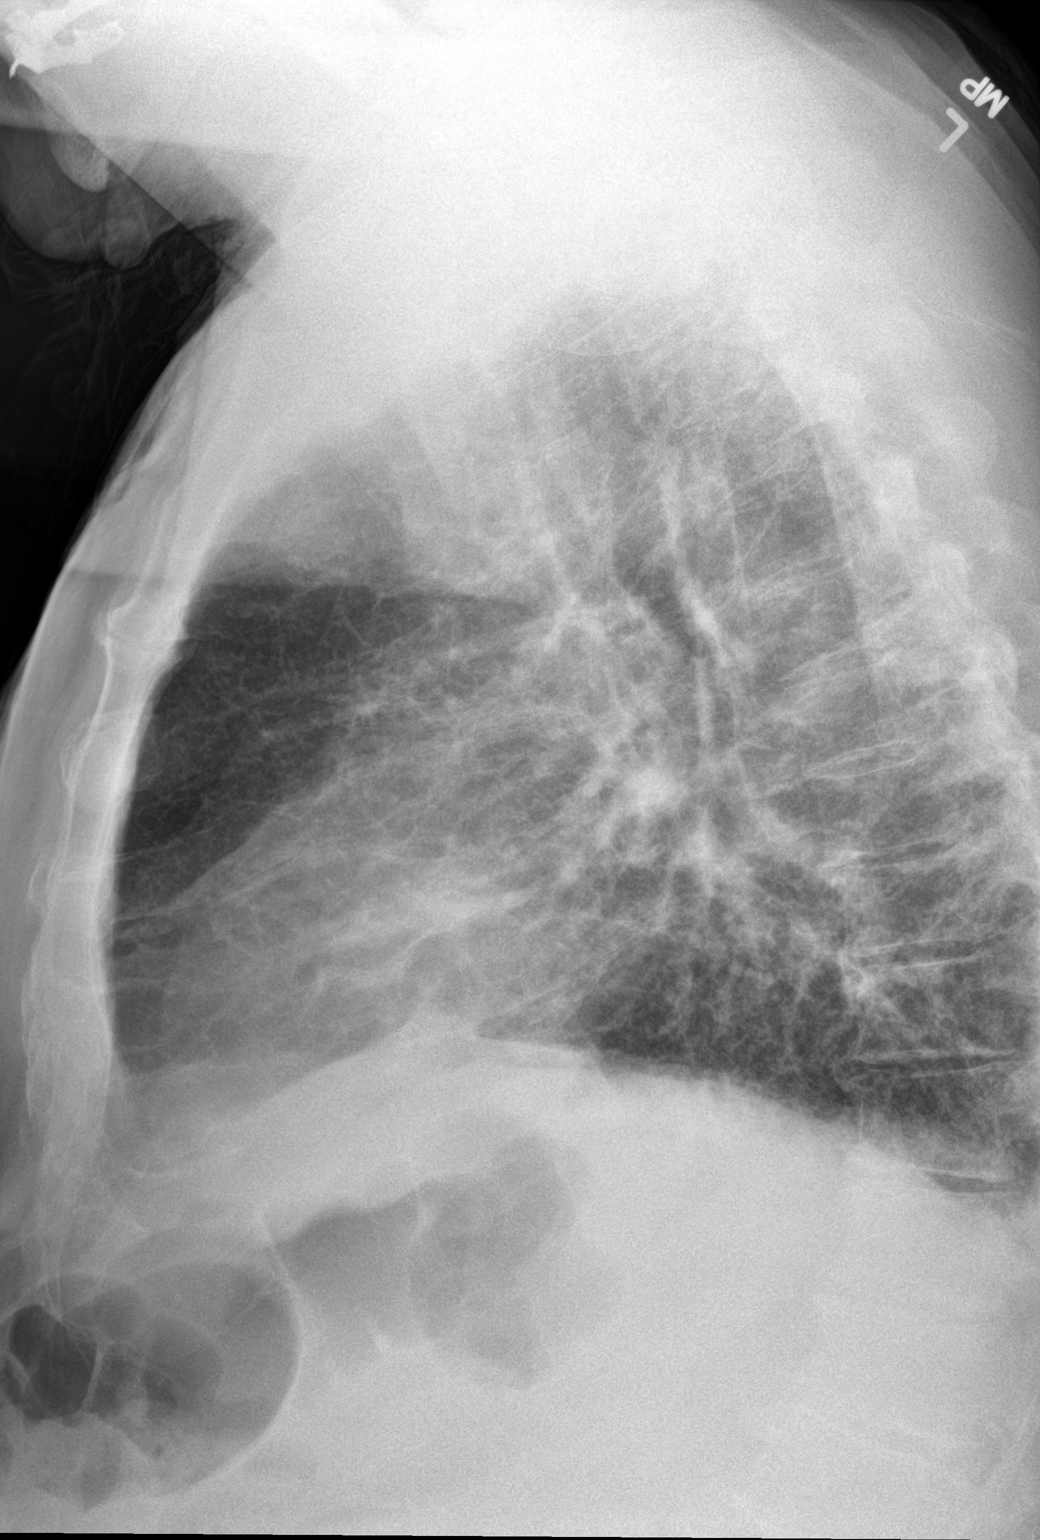

[3 of 3 positions shown; findings below may reference images not displayed]

FINDINGS: The heart size and mediastinal contours are within normal limits.
Normal pulmonary vascularity. New coarse peripheral and basilar
predominant interstitial thickening in both lungs. No focal
consolidation, pleural effusion, or pneumothorax. No acute osseous
abnormality.
IMPRESSION: Coarse peripheral and basilar predominant interstitial thickening in
both lungs, likely sequelae from prior COVID infection.

## 2020-01-27 NOTE — Progress Notes (Signed)
BP 91/68   Pulse (!) 110   Temp 97.8 F (36.6 C)   Ht 5' 10"  (1.778 m)   Wt 212 lb (96.2 kg)   SpO2 95%   BMI 30.42 kg/m    Subjective:   [name], male    DOB: December 10, 1945, 74 y.o.   MRN: [id number]  HPI: Craig Hurley is a 74 y.o. male presenting on 01/27/2020 for Hospitalization Follow-up, Cerebrovascular Accident, and Fatigue   HPI Patient is coming in today for hospital follow-up status post CVA and Covid pneumonia follow-up.  Patient was admitted on 12/25/2019 to [facility] and transferred to [facility] that same day.  Per patient and his wife for here in the office today he had an episode where he started to feel numb in his right hand and then it extended through the right side of his body and he fell down and hit the ground and then was taken via EMS to the hospital and found to have pneumonia and positive for Covid but also found to have had a stroke and right-sided weakness at the same time.  He was transferred to [facility] for TPA.  He was discharged from [place] on 01/09/2020.  He was treated for the pneumonia and treated for the stroke and was sent home with home physical therapy which she is doing and also has speech and occupational therapy as well which she has been doing.  He says his right arm strength is back, he says he still feels a little weak in the right leg but can walk but now uses a cane.  He says that night he noticed the right side of his face he gets some drooling out of it and feels like there may be some weakness there.  Per wife they have also noticed that he has a little bit of difficulty forming words sometimes and those of the things that they have noticed from the stroke.  As far as his pneumonia, he has a small cough left but that is back to the way he was before he went in the hospital denies any further shortness of breath or wheezing or fevers or chills.  He says he is feeling pretty good from that regard except for he  just feels fatigued and still somewhat weak.  He does have follow-up with neurology  Relevant past medical, surgical, family and social history reviewed and updated as indicated. Interim medical history since our last visit reviewed. Allergies and medications reviewed and updated.  Review of Systems  Constitutional: Positive for fatigue. Negative for chills and fever.  HENT: Negative for congestion, sinus pressure, sinus pain and sneezing.   Eyes: Negative for discharge.  Respiratory: Positive for cough. Negative for shortness of breath and wheezing.   Cardiovascular: Negative for chest pain and leg swelling.  Musculoskeletal: Negative for back pain, gait problem and myalgias.  Skin: Negative for rash.  Neurological: Positive for weakness.  All other systems reviewed and are negative.   Per HPI unless specifically indicated above   Allergies as of 01/27/2020      Reactions   Penicillins Swelling      Medication List       Accurate as of January 27, 2020 11:43 AM. If you have any questions, ask your nurse or doctor.        STOP taking these medications   doxycycline 100 MG tablet Commonly known as: VIBRA-TABS Stopped by: Worthy Rancher, MD     TAKE these  medications   acetaminophen 325 MG tablet Commonly known as: TYLENOL Take 650 mg by mouth every 6 (six) hours as needed.   albuterol 108 (90 Base) MCG/ACT inhaler Commonly known as: VENTOLIN HFA Inhale 2 puffs into the lungs every 4 (four) hours as needed for wheezing or shortness of breath.   benzonatate 100 MG capsule Commonly known as: Tessalon Perles Take 1 capsule (100 mg total) by mouth 3 (three) times daily as needed for cough.   cetirizine 10 MG tablet Commonly known as: ZYRTEC Take 1 tablet (10 mg total) by mouth daily.   doxazosin 4 MG tablet Commonly known as: CARDURA Take 1 tablet by mouth once daily   Eliquis 5 MG Tabs tablet Generic drug: apixaban Take 5 mg by mouth 2 (two) times daily.   EYE  VITAMINS PO Take by mouth.   Fluticasone-Salmeterol 250-50 MCG/DOSE Aepb Commonly known as: ADVAIR INHALE 1 DOSE BY MOUTH TWICE DAILY   guaifenesin 400 MG Tabs tablet Commonly known as: HUMIBID E Take 400 mg by mouth every 6 (six) hours.   meloxicam 15 MG tablet Commonly known as: MOBIC Take 1 tablet by mouth once daily   montelukast 10 MG tablet Commonly known as: SINGULAIR TAKE 1 TABLET BY MOUTH AT BEDTIME   omeprazole 20 MG capsule Commonly known as: PRILOSEC TAKE 1 CAPSULE BY MOUTH TWICE DAILY BEFORE MEAL(S)   senna-docusate 8.6-50 MG tablet Commonly known as: Senokot-S Take 1 tablet by mouth 2 (two) times daily.   Spiriva HandiHaler 18 MCG inhalation capsule Generic drug: tiotropium Place 1 capsule (18 mcg total) into inhaler and inhale daily.   vitamin A 8000 UNIT capsule Take 8,000 Units by mouth daily.        Objective:   BP 91/68   Pulse (!) 110   Temp 97.8 F (36.6 C)   Ht 5' 10"  (1.778 m)   Wt 212 lb (96.2 kg)   SpO2 95%   BMI 30.42 kg/m   Wt Readings from Last 3 Encounters:  01/27/20 212 lb (96.2 kg)  07/14/19 250 lb (113.4 kg)  05/01/19 250 lb (113.4 kg)    Physical Exam Vitals and nursing note reviewed.  Constitutional:      General: He is not in acute distress.    Appearance: He is well-developed. He is not diaphoretic.  Eyes:     General: No scleral icterus.    Conjunctiva/sclera: Conjunctivae normal.  Neck:     Thyroid: No thyromegaly.  Cardiovascular:     Rate and Rhythm: Normal rate and regular rhythm.     Heart sounds: Normal heart sounds. No murmur.  Pulmonary:     Effort: Pulmonary effort is normal. No respiratory distress.     Breath sounds: Normal breath sounds. No wheezing.  Musculoskeletal:     Cervical back: Neck supple.  Lymphadenopathy:     Cervical: No cervical adenopathy.  Skin:    General: Skin is warm and dry.     Findings: No rash.  Neurological:     Mental Status: He is alert and oriented to person,  place, and time.     Coordination: Coordination normal.  Psychiatric:        Behavior: Behavior normal.     Comments: No noticeable weakness between the right and left side in the arms or the legs on exam.       Assessment & Plan:   Problem List Items Addressed This Visit    None    Visit Diagnoses    Pneumonia  due to COVID-19 virus    -  Primary   Relevant Medications   guaifenesin (HUMIBID E) 400 MG TABS tablet   Other Relevant Orders   CBC with Differential/Platelet   CMP14+EGFR   DG Chest 2 View   Late effects of CVA (cerebrovascular accident)       Relevant Orders   CBC with Differential/Platelet   CMP14+EGFR      Patient already has home health and PT for at least the next couple weeks, he will continue with them, will check blood work today and do a chest x-ray. He still has some weakness in his right leg from walking and he has some drooling at times from the right side of his face especially at night but it is improving and he is working with speech and OT and PT  Follow up plan: Return in about 5 weeks (around 03/02/2020), or if symptoms worsen or fail to improve, for Follow-up CVA and diabetes.  Counseling provided for all of the vaccine components Orders Placed This Encounter  Procedures  . DG Chest 2 View  . CBC with Differential/Platelet  . San German, MD [facility] 01/27/2020, 11:43 AM

## 2020-01-28 LAB — CMP14+EGFR
ALT: 25 IU/L (ref 0–44)
AST: 20 IU/L (ref 0–40)
Albumin/Globulin Ratio: 1.3 (ref 1.2–2.2)
Albumin: 3.4 g/dL — ABNORMAL LOW (ref 3.7–4.7)
Alkaline Phosphatase: 87 IU/L (ref 39–117)
BUN/Creatinine Ratio: 16 (ref 10–24)
BUN: 15 mg/dL (ref 8–27)
Bilirubin Total: <0.2 mg/dL (ref 0.0–1.2)
CO2: 21 mmol/L (ref 20–29)
Calcium: 8.6 mg/dL (ref 8.6–10.2)
Chloride: 106 mmol/L (ref 96–106)
Creatinine, Ser: 0.94 mg/dL (ref 0.76–1.27)
GFR calc Af Amer: 92 mL/min/{1.73_m2} (ref >59)
GFR calc non Af Amer: 80 mL/min/{1.73_m2} (ref >59)
Globulin, Total: 2.6 g/dL (ref 1.5–4.5)
Glucose: 131 mg/dL — ABNORMAL HIGH (ref 65–99)
Potassium: 3.7 mmol/L (ref 3.5–5.2)
Sodium: 142 mmol/L (ref 134–144)
Total Protein: 6 g/dL (ref 6.0–8.5)

## 2020-01-28 LAB — CBC WITH DIFFERENTIAL/PLATELET
Basophils Absolute: 0 10*3/uL (ref 0.0–0.2)
Basos: 0 %
EOS (ABSOLUTE): 0 10*3/uL (ref 0.0–0.4)
Eos: 0 %
Hematocrit: 38.8 % (ref 37.5–51.0)
Hemoglobin: 13.1 g/dL (ref 13.0–17.7)
Immature Grans (Abs): 0 10*3/uL (ref 0.0–0.1)
Immature Granulocytes: 0 %
Lymphocytes Absolute: 0.9 10*3/uL (ref 0.7–3.1)
Lymphs: 12 %
MCH: 28 pg (ref 26.6–33.0)
MCHC: 33.8 g/dL (ref 31.5–35.7)
MCV: 83 fL (ref 79–97)
Monocytes Absolute: 0.6 10*3/uL (ref 0.1–0.9)
Monocytes: 8 %
Neutrophils Absolute: 6.1 10*3/uL (ref 1.4–7.0)
Neutrophils: 80 %
Platelets: 318 10*3/uL (ref 150–450)
RBC: 4.68 x10E6/uL (ref 4.14–5.80)
RDW: 16.4 % — ABNORMAL HIGH (ref 11.6–15.4)
WBC: 7.7 10*3/uL (ref 3.4–10.8)

## 2020-01-29 ENCOUNTER — Other Ambulatory Visit: Payer: Self-pay

## 2020-01-29 ENCOUNTER — Ambulatory Visit (INDEPENDENT_AMBULATORY_CARE_PROVIDER_SITE_OTHER): Payer: Medicare Other

## 2020-01-29 DIAGNOSIS — I10 Essential (primary) hypertension: Secondary | ICD-10-CM | POA: Diagnosis not present

## 2020-01-29 DIAGNOSIS — H9190 Unspecified hearing loss, unspecified ear: Secondary | ICD-10-CM | POA: Diagnosis not present

## 2020-01-29 DIAGNOSIS — N400 Enlarged prostate without lower urinary tract symptoms: Secondary | ICD-10-CM

## 2020-01-29 DIAGNOSIS — R1312 Dysphagia, oropharyngeal phase: Secondary | ICD-10-CM | POA: Diagnosis not present

## 2020-01-29 DIAGNOSIS — I69351 Hemiplegia and hemiparesis following cerebral infarction affecting right dominant side: Secondary | ICD-10-CM | POA: Diagnosis not present

## 2020-01-29 DIAGNOSIS — I69391 Dysphagia following cerebral infarction: Secondary | ICD-10-CM

## 2020-01-29 DIAGNOSIS — J449 Chronic obstructive pulmonary disease, unspecified: Secondary | ICD-10-CM

## 2020-01-29 DIAGNOSIS — I69320 Aphasia following cerebral infarction: Secondary | ICD-10-CM

## 2020-01-29 DIAGNOSIS — D649 Anemia, unspecified: Secondary | ICD-10-CM

## 2020-01-29 DIAGNOSIS — Z8616 Personal history of COVID-19: Secondary | ICD-10-CM

## 2020-01-29 DIAGNOSIS — I69392 Facial weakness following cerebral infarction: Secondary | ICD-10-CM | POA: Diagnosis not present

## 2020-01-29 DIAGNOSIS — E1159 Type 2 diabetes mellitus with other circulatory complications: Secondary | ICD-10-CM | POA: Diagnosis not present

## 2020-01-29 DIAGNOSIS — Z9181 History of falling: Secondary | ICD-10-CM

## 2020-01-30 DIAGNOSIS — I69351 Hemiplegia and hemiparesis following cerebral infarction affecting right dominant side: Secondary | ICD-10-CM | POA: Diagnosis not present

## 2020-01-30 DIAGNOSIS — I69392 Facial weakness following cerebral infarction: Secondary | ICD-10-CM | POA: Diagnosis not present

## 2020-01-30 DIAGNOSIS — I10 Essential (primary) hypertension: Secondary | ICD-10-CM | POA: Diagnosis not present

## 2020-01-30 DIAGNOSIS — R1312 Dysphagia, oropharyngeal phase: Secondary | ICD-10-CM | POA: Diagnosis not present

## 2020-01-30 DIAGNOSIS — E1159 Type 2 diabetes mellitus with other circulatory complications: Secondary | ICD-10-CM | POA: Diagnosis not present

## 2020-01-30 DIAGNOSIS — I69391 Dysphagia following cerebral infarction: Secondary | ICD-10-CM | POA: Diagnosis not present

## 2020-01-30 LAB — HGB A1C W/O EAG: Hgb A1c MFr Bld: 6 % — ABNORMAL HIGH (ref 4.8–5.6)

## 2020-01-30 LAB — SPECIMEN STATUS REPORT

## 2020-02-02 ENCOUNTER — Encounter: Payer: Self-pay | Admitting: Family Medicine

## 2020-02-02 DIAGNOSIS — I69351 Hemiplegia and hemiparesis following cerebral infarction affecting right dominant side: Secondary | ICD-10-CM | POA: Diagnosis not present

## 2020-02-02 DIAGNOSIS — R1312 Dysphagia, oropharyngeal phase: Secondary | ICD-10-CM | POA: Diagnosis not present

## 2020-02-02 DIAGNOSIS — B351 Tinea unguium: Secondary | ICD-10-CM | POA: Diagnosis not present

## 2020-02-02 DIAGNOSIS — E1159 Type 2 diabetes mellitus with other circulatory complications: Secondary | ICD-10-CM | POA: Diagnosis not present

## 2020-02-02 DIAGNOSIS — I10 Essential (primary) hypertension: Secondary | ICD-10-CM | POA: Diagnosis not present

## 2020-02-02 DIAGNOSIS — I69392 Facial weakness following cerebral infarction: Secondary | ICD-10-CM | POA: Diagnosis not present

## 2020-02-02 DIAGNOSIS — I69391 Dysphagia following cerebral infarction: Secondary | ICD-10-CM | POA: Diagnosis not present

## 2020-02-03 DIAGNOSIS — R1312 Dysphagia, oropharyngeal phase: Secondary | ICD-10-CM | POA: Diagnosis not present

## 2020-02-03 DIAGNOSIS — I69391 Dysphagia following cerebral infarction: Secondary | ICD-10-CM | POA: Diagnosis not present

## 2020-02-03 DIAGNOSIS — I69392 Facial weakness following cerebral infarction: Secondary | ICD-10-CM | POA: Diagnosis not present

## 2020-02-03 DIAGNOSIS — I69351 Hemiplegia and hemiparesis following cerebral infarction affecting right dominant side: Secondary | ICD-10-CM | POA: Diagnosis not present

## 2020-02-03 DIAGNOSIS — I10 Essential (primary) hypertension: Secondary | ICD-10-CM | POA: Diagnosis not present

## 2020-02-03 DIAGNOSIS — E1159 Type 2 diabetes mellitus with other circulatory complications: Secondary | ICD-10-CM | POA: Diagnosis not present

## 2020-02-06 ENCOUNTER — Ambulatory Visit: Payer: Medicare Other | Attending: Internal Medicine

## 2020-02-06 DIAGNOSIS — Z23 Encounter for immunization: Secondary | ICD-10-CM

## 2020-02-06 NOTE — Progress Notes (Signed)
   Covid-19 Vaccination Clinic  Name:  Craig Hurley    MRN: [id number] DOB: 10-29-1946  02/06/2020  [name] was observed post Covid-19 immunization for 15 minutes without incident. He was provided with Vaccine Information Sheet and instruction to access the V-Safe system.   [name] was instructed to call 911 with any severe reactions post vaccine: Marland Kitchen Difficulty breathing  . Swelling of face and throat  . A fast heartbeat  . A bad rash all over body  . Dizziness and weakness   Immunizations Administered    Name Date Dose VIS Date Route   Moderna COVID-19 Vaccine 02/06/2020  8:33 AM 0.5 mL 10/28/2019 Intramuscular   Manufacturer: Moderna   Lot: [id number]   [id number]

## 2020-02-09 DIAGNOSIS — I69392 Facial weakness following cerebral infarction: Secondary | ICD-10-CM | POA: Diagnosis not present

## 2020-02-09 DIAGNOSIS — I69351 Hemiplegia and hemiparesis following cerebral infarction affecting right dominant side: Secondary | ICD-10-CM | POA: Diagnosis not present

## 2020-02-09 DIAGNOSIS — R1312 Dysphagia, oropharyngeal phase: Secondary | ICD-10-CM | POA: Diagnosis not present

## 2020-02-09 DIAGNOSIS — E1159 Type 2 diabetes mellitus with other circulatory complications: Secondary | ICD-10-CM | POA: Diagnosis not present

## 2020-02-09 DIAGNOSIS — I10 Essential (primary) hypertension: Secondary | ICD-10-CM | POA: Diagnosis not present

## 2020-02-09 DIAGNOSIS — I69391 Dysphagia following cerebral infarction: Secondary | ICD-10-CM | POA: Diagnosis not present

## 2020-02-11 ENCOUNTER — Encounter: Payer: Self-pay | Admitting: Family Medicine

## 2020-02-12 DIAGNOSIS — I69351 Hemiplegia and hemiparesis following cerebral infarction affecting right dominant side: Secondary | ICD-10-CM | POA: Diagnosis not present

## 2020-02-12 DIAGNOSIS — R1312 Dysphagia, oropharyngeal phase: Secondary | ICD-10-CM | POA: Diagnosis not present

## 2020-02-12 DIAGNOSIS — I69392 Facial weakness following cerebral infarction: Secondary | ICD-10-CM | POA: Diagnosis not present

## 2020-02-12 DIAGNOSIS — I10 Essential (primary) hypertension: Secondary | ICD-10-CM | POA: Diagnosis not present

## 2020-02-12 DIAGNOSIS — E1159 Type 2 diabetes mellitus with other circulatory complications: Secondary | ICD-10-CM | POA: Diagnosis not present

## 2020-02-12 DIAGNOSIS — I69391 Dysphagia following cerebral infarction: Secondary | ICD-10-CM | POA: Diagnosis not present

## 2020-02-12 MED ORDER — APIXABAN 5 MG PO TABS: 5.0000 mg | ORAL_TABLET | Freq: Two times a day (BID) | ORAL | 2 refills | Status: DC

## 2020-02-12 MED ORDER — ATORVASTATIN CALCIUM 10 MG PO TABS: 10.0000 mg | ORAL_TABLET | Freq: Every day | ORAL | 0 refills | Status: DC

## 2020-02-16 ENCOUNTER — Telehealth: Payer: Self-pay | Admitting: *Deleted

## 2020-02-16 NOTE — Telephone Encounter (Signed)
Eliquis 5mg  Tablets is not covered by pt's insurance.  Covered alternatives may include Warfarin, Xarelto,

## 2020-02-18 DIAGNOSIS — I69351 Hemiplegia and hemiparesis following cerebral infarction affecting right dominant side: Secondary | ICD-10-CM | POA: Diagnosis not present

## 2020-02-18 MED ORDER — RIVAROXABAN 20 MG PO TABS: 20.0000 mg | ORAL_TABLET | Freq: Every day | ORAL | 5 refills | Status: DC

## 2020-02-18 NOTE — Telephone Encounter (Signed)
Left message to please call our office for any questions.  Craig Hurley was sent to pharmacy. You can begin today taking it if you are out of eliquis.

## 2020-02-18 NOTE — Telephone Encounter (Signed)
Please let patient know that Eliquis was not covered but Xarelto should be very similar and it looks like it should be covered, I sent a prescription of Xarelto to the pharmacy and he should go ahead and start that, if he took Eliquis today and that he could start Xarelto tomorrow, if he has not taken Eliquis then go ahead and start the Xarelto. Caryl Pina, MD [facility] 02/18/2020, 8:13 AM

## 2020-02-23 ENCOUNTER — Ambulatory Visit: Payer: Medicare Other | Admitting: Family Medicine

## 2020-03-08 ENCOUNTER — Ambulatory Visit (INDEPENDENT_AMBULATORY_CARE_PROVIDER_SITE_OTHER): Payer: Medicare Other | Admitting: Family Medicine

## 2020-03-08 ENCOUNTER — Other Ambulatory Visit: Payer: Self-pay

## 2020-03-08 ENCOUNTER — Encounter: Payer: Self-pay | Admitting: Family Medicine

## 2020-03-08 VITALS — BP 103/70 | HR 96 | Temp 98.0°F | Ht 70.0 in | Wt 225.2 lb

## 2020-03-08 DIAGNOSIS — I6990 Unspecified sequelae of unspecified cerebrovascular disease: Secondary | ICD-10-CM

## 2020-03-08 DIAGNOSIS — R7303 Prediabetes: Secondary | ICD-10-CM

## 2020-03-08 NOTE — Progress Notes (Signed)
BP 103/70   Pulse 96   Temp 98 F (36.7 C) (Temporal)   Ht 5' 10"  (1.778 m)   Wt 225 lb 4 oz (102.2 kg)   BMI 32.32 kg/m    Subjective:   [name], male    DOB: [birth year], 74 y.o.   MRN: [id number]  HPI: Craig Hurley is a 74 y.o. male presenting on 03/08/2020 for Medical Management of Chronic Issues (pt here today following up after stroke 5 weeks ago)   HPI Prediabetes Patient comes in today for recheck of his diabetes. Patient has been currently taking no medication and has been diet controlled. Patient is not currently on an ACE inhibitor/ARB. Patient has not seen an ophthalmologist this year. Patient denies any issues with their feet.   Aphasia and late effects of stroke recheck Wife is coming in with him to discuss aphasia and late effects of stroke and whether or not he can drive.  He is doing better on the aphasia and she has not noticed him having any hemineglect.  Recommended that he could start with small drives with her in the car just in case he does have any kind of hemineglect which we have not been able to discern.  Otherwise he is doing fine.  Relevant past medical, surgical, family and social history reviewed and updated as indicated. Interim medical history since our last visit reviewed. Allergies and medications reviewed and updated.  Review of Systems  Constitutional: Negative for chills and fever.  Eyes: Negative for visual disturbance.  Respiratory: Negative for shortness of breath and wheezing.   Cardiovascular: Negative for chest pain and leg swelling.  Musculoskeletal: Negative for back pain and gait problem.  Skin: Negative for rash.  Neurological: Negative for dizziness, weakness and numbness.  All other systems reviewed and are negative.   Per HPI unless specifically indicated above   Allergies as of 03/08/2020      Reactions   Penicillins Swelling      Medication List       Accurate as of March 08, 2020  1:59 PM.  If you have any questions, ask your nurse or doctor.        STOP taking these medications   benzonatate 100 MG capsule Commonly known as: Best boy Stopped by: Fransisca Kaufmann Razia Screws, MD   guaifenesin 400 MG Tabs tablet Commonly known as: HUMIBID E Stopped by: Worthy Rancher, MD   senna-docusate 8.6-50 MG tablet Commonly known as: Senokot-S Stopped by: Worthy Rancher, MD     TAKE these medications   acetaminophen 325 MG tablet Commonly known as: TYLENOL Take 650 mg by mouth every 6 (six) hours as needed.   albuterol 108 (90 Base) MCG/ACT inhaler Commonly known as: VENTOLIN HFA Inhale 2 puffs into the lungs every 4 (four) hours as needed for wheezing or shortness of breath.   atorvastatin 10 MG tablet Commonly known as: LIPITOR Take 1 tablet (10 mg total) by mouth daily.   cetirizine 10 MG tablet Commonly known as: ZYRTEC Take 1 tablet (10 mg total) by mouth daily.   doxazosin 4 MG tablet Commonly known as: CARDURA Take 1 tablet by mouth once daily   EYE VITAMINS PO Take by mouth.   Fluticasone-Salmeterol 250-50 MCG/DOSE Aepb Commonly known as: ADVAIR INHALE 1 DOSE BY MOUTH TWICE DAILY   meloxicam 15 MG tablet Commonly known as: MOBIC Take 15 mg by mouth daily.   montelukast 10 MG tablet Commonly known as: SINGULAIR TAKE 1 TABLET  BY MOUTH AT BEDTIME   omeprazole 20 MG capsule Commonly known as: PRILOSEC TAKE 1 CAPSULE BY MOUTH TWICE DAILY BEFORE MEAL(S)   rivaroxaban 20 MG Tabs tablet Commonly known as: XARELTO Take 1 tablet (20 mg total) by mouth daily with supper.   Spiriva HandiHaler 18 MCG inhalation capsule Generic drug: tiotropium Place 1 capsule (18 mcg total) into inhaler and inhale daily.   vitamin A 8000 UNIT capsule Take 8,000 Units by mouth daily.        Objective:   BP 103/70   Pulse 96   Temp 98 F (36.7 C) (Temporal)   Ht 5' 10"  (1.778 m)   Wt 225 lb 4 oz (102.2 kg)   BMI 32.32 kg/m   Wt Readings from Last 3  Encounters:  03/08/20 225 lb 4 oz (102.2 kg)  01/27/20 212 lb (96.2 kg)  07/14/19 250 lb (113.4 kg)    Physical Exam Vitals and nursing note reviewed.  Constitutional:      General: He is not in acute distress.    Appearance: He is well-developed. He is not diaphoretic.  Eyes:     General: No scleral icterus.    Conjunctiva/sclera: Conjunctivae normal.  Neck:     Thyroid: No thyromegaly.  Cardiovascular:     Rate and Rhythm: Normal rate and regular rhythm.     Heart sounds: Normal heart sounds. No murmur.  Pulmonary:     Effort: Pulmonary effort is normal. No respiratory distress.     Breath sounds: Normal breath sounds. No wheezing.  Musculoskeletal:     Cervical back: Neck supple.  Lymphadenopathy:     Cervical: No cervical adenopathy.  Skin:    General: Skin is warm and dry.     Findings: No rash.  Neurological:     Mental Status: He is alert and oriented to person, place, and time.     Coordination: Coordination normal.  Psychiatric:        Behavior: Behavior normal.     Results for orders placed or performed in visit on 01/27/20  CBC with Differential/Platelet  Result Value Ref Range   WBC 7.7 3.4 - 10.8 x10E3/uL   RBC 4.68 4.14 - 5.80 x10E6/uL   Hemoglobin 13.1 13.0 - 17.7 g/dL   Hematocrit 38.8 37.5 - 51.0 %   MCV 83 79 - 97 fL   MCH 28.0 26.6 - 33.0 pg   MCHC 33.8 31.5 - 35.7 g/dL   RDW 16.4 (H) 11.6 - 15.4 %   Platelets 318 150 - 450 x10E3/uL   Neutrophils 80 Not Estab. %   Lymphs 12 Not Estab. %   Monocytes 8 Not Estab. %   Eos 0 Not Estab. %   Basos 0 Not Estab. %   Neutrophils Absolute 6.1 1.4 - 7.0 x10E3/uL   Lymphocytes Absolute 0.9 0.7 - 3.1 x10E3/uL   Monocytes Absolute 0.6 0.1 - 0.9 x10E3/uL   EOS (ABSOLUTE) 0.0 0.0 - 0.4 x10E3/uL   Basophils Absolute 0.0 0.0 - 0.2 x10E3/uL   Immature Granulocytes 0 Not Estab. %   Immature Grans (Abs) 0.0 0.0 - 0.1 x10E3/uL  CMP14+EGFR  Result Value Ref Range   Glucose 131 (H) 65 - 99 mg/dL   BUN 15 8 -  27 mg/dL   Creatinine, Ser 0.94 0.76 - 1.27 mg/dL   GFR calc non Af Amer 80 >59 mL/min/1.73   GFR calc Af Amer 92 >59 mL/min/1.73   BUN/Creatinine Ratio 16 10 - 24   Sodium 142 134 -  144 mmol/L   Potassium 3.7 3.5 - 5.2 mmol/L   Chloride 106 96 - 106 mmol/L   CO2 21 20 - 29 mmol/L   Calcium 8.6 8.6 - 10.2 mg/dL   Total Protein 6.0 6.0 - 8.5 g/dL   Albumin 3.4 (L) 3.7 - 4.7 g/dL   Globulin, Total 2.6 1.5 - 4.5 g/dL   Albumin/Globulin Ratio 1.3 1.2 - 2.2   Bilirubin Total <0.2 0.0 - 1.2 mg/dL   Alkaline Phosphatase 87 39 - 117 IU/L   AST 20 0 - 40 IU/L   ALT 25 0 - 44 IU/L  Hgb A1c w/o eAG  Result Value Ref Range   Hgb A1c MFr Bld 6.0 (H) 4.8 - 5.6 %  Specimen status report  Result Value Ref Range   specimen status report Comment     Assessment & Plan:   Problem List Items Addressed This Visit      Other   Prediabetes    Other Visit Diagnoses    Late effects of CVA (cerebrovascular accident)    -  Primary    Patient has a mild amount of aphasia left but it is improving per his wife and she denies them having any new issues arise.  His blood work looks good from couple months ago. A1c was 6.0 Follow up plan: Return in about 2 months (around 05/08/2020), or if symptoms worsen or fail to improve, for Prediabetes recheck.  Counseling provided for all of the vaccine components No orders of the defined types were placed in this encounter.   Caryl Pina, MD [facility] 03/08/2020, 1:59 PM

## 2020-03-09 ENCOUNTER — Ambulatory Visit: Payer: Medicare Other | Attending: Internal Medicine

## 2020-03-09 DIAGNOSIS — Z23 Encounter for immunization: Secondary | ICD-10-CM

## 2020-03-09 NOTE — Progress Notes (Signed)
   Covid-19 Vaccination Clinic  Name:  Craig Hurley    MRN: [id number] DOB: 1946/10/03  03/09/2020  [name] was observed post Covid-19 immunization for 15 minutes without incident. He was provided with Vaccine Information Sheet and instruction to access the V-Safe system.   [name] was instructed to call 911 with any severe reactions post vaccine: Marland Kitchen Difficulty breathing  . Swelling of face and throat  . A fast heartbeat  . A bad rash all over body  . Dizziness and weakness   Immunizations Administered    Name Date Dose VIS Date Route   Moderna COVID-19 Vaccine 03/09/2020  9:16 AM 0.5 mL 10/28/2019 Intramuscular   Manufacturer: Levan Hurst   Lot: [id number]   [id number]      Covid-19 Vaccination Clinic  Name:  Craig Hurley    MRN: [id number] DOB: 1946/10/23  03/09/2020  [name] was observed post Covid-19 immunization for 15 minutes without incident. He was provided with Vaccine Information Sheet and instruction to access the V-Safe system.   [name] was instructed to call 911 with any severe reactions post vaccine: Marland Kitchen Difficulty breathing  . Swelling of face and throat  . A fast heartbeat  . A bad rash all over body  . Dizziness and weakness   Immunizations Administered    Name Date Dose VIS Date Route   Moderna COVID-19 Vaccine 03/09/2020  9:16 AM 0.5 mL 10/28/2019 Intramuscular   Manufacturer: Moderna   Lot: [id number]   [id number]

## 2020-03-21 ENCOUNTER — Other Ambulatory Visit: Payer: Self-pay | Admitting: Family Medicine

## 2020-05-05 ENCOUNTER — Other Ambulatory Visit: Payer: Self-pay

## 2020-05-05 ENCOUNTER — Encounter: Payer: Self-pay | Admitting: Family Medicine

## 2020-05-05 ENCOUNTER — Ambulatory Visit (INDEPENDENT_AMBULATORY_CARE_PROVIDER_SITE_OTHER): Payer: Medicare Other | Admitting: Family Medicine

## 2020-05-05 VITALS — BP 96/62 | HR 67 | Temp 97.5°F | Ht 70.0 in | Wt 234.0 lb

## 2020-05-05 DIAGNOSIS — K219 Gastro-esophageal reflux disease without esophagitis: Secondary | ICD-10-CM

## 2020-05-05 DIAGNOSIS — J439 Emphysema, unspecified: Secondary | ICD-10-CM | POA: Diagnosis not present

## 2020-05-05 DIAGNOSIS — N401 Enlarged prostate with lower urinary tract symptoms: Secondary | ICD-10-CM

## 2020-05-05 DIAGNOSIS — R350 Frequency of micturition: Secondary | ICD-10-CM | POA: Diagnosis not present

## 2020-05-05 DIAGNOSIS — R7303 Prediabetes: Secondary | ICD-10-CM

## 2020-05-05 LAB — BAYER DCA HB A1C WAIVED: HB A1C (BAYER DCA - WAIVED): 6 % (ref <7.0)

## 2020-05-05 NOTE — Progress Notes (Signed)
BP 96/62   Pulse 67   Temp (!) 97.5 F (36.4 C) (Temporal)   Ht _0  (1.778 m)   Wt 234 lb (106.1 kg)   SpO2 94%   BMI 33.58 kg/m    Subjective:   [name], male    DOB: 07/05/1946, 74 y.o.   MRN: [id number]  HPI: Craig Hurley is a 74 y.o. male presenting on 05/05/2020 for Medical Management of Chronic Issues   HPI COPD Patient is coming in for COPD recheck today.  He is currently on Advair and Singulair and Spiriva and albuterol.  He has a mild chronic cough but denies any major coughing spells or wheezing spells.  He has 2nighttime symptoms per week and 3daytime symptoms per week currently.  He feels like he is stable and feels like things are going good and are not excessive for him.  Prediabetes Patient comes in today for recheck of his diabetes. Patient has been currently taking no medication has been diet controlled. Patient is not currently on an ACE inhibitor/ARB. Patient has not seen an ophthalmologist this year. Patient denies any issues with their feet. The symptom started onset as an adult.  ARE RELATED TO DM   GERD Patient is currently on omeprazole.  She denies any major symptoms or abdominal pain or belching or burping. She denies any blood in her stool or lightheadedness or dizziness.   BPH Patient is coming in for recheck on BPH Symptoms: None currently Medication: Doxazosin, his blood pressure has been down more.  He denies any lightheadedness or dizziness. Last PSA: 05/01/2019, 1.8  Relevant past medical, surgical, family and social history reviewed and updated as indicated. Interim medical history since our last visit reviewed. Allergies and medications reviewed and updated.  Review of Systems  Constitutional: Negative for chills and fever.  Eyes: Negative for visual disturbance.  Respiratory: Negative for shortness of breath and wheezing.   Cardiovascular: Negative for chest pain and leg swelling.  Musculoskeletal: Negative for  back pain and gait problem.  Skin: Negative for rash.  Neurological: Negative for dizziness, weakness and light-headedness.  All other systems reviewed and are negative.   Per HPI unless specifically indicated above   Allergies as of 05/05/2020      Reactions   Penicillins Swelling      Medication List       Accurate as of May 05, 2020 10:58 AM. If you have any questions, ask your nurse or doctor.        acetaminophen 325 MG tablet Commonly known as: TYLENOL Take 650 mg by mouth every 6 (six) hours as needed.   albuterol 108 (90 Base) MCG/ACT inhaler Commonly known as: VENTOLIN HFA Inhale 2 puffs into the lungs every 4 (four) hours as needed for wheezing or shortness of breath.   atorvastatin 10 MG tablet Commonly known as: LIPITOR Take 1 tablet (10 mg total) by mouth daily.   cetirizine 10 MG tablet Commonly known as: ZYRTEC Take 1 tablet (10 mg total) by mouth daily.   doxazosin 4 MG tablet Commonly known as: CARDURA Take 1 tablet by mouth once daily   EYE VITAMINS PO Take by mouth.   Fluticasone-Salmeterol 250-50 MCG/DOSE Aepb Commonly known as: ADVAIR INHALE 1 DOSE BY MOUTH TWICE DAILY   meloxicam 15 MG tablet Commonly known as: MOBIC Take 1 tablet by mouth once daily   montelukast 10 MG tablet Commonly known as: SINGULAIR TAKE 1 TABLET BY MOUTH AT BEDTIME   omeprazole 20  MG capsule Commonly known as: PRILOSEC TAKE 1 CAPSULE BY MOUTH TWICE DAILY BEFORE MEAL(S)   rivaroxaban 20 MG Tabs tablet Commonly known as: XARELTO Take 1 tablet (20 mg total) by mouth daily with supper.   Spiriva HandiHaler 18 MCG inhalation capsule Generic drug: tiotropium Place 1 capsule (18 mcg total) into inhaler and inhale daily.   vitamin A 8000 UNIT capsule Take 8,000 Units by mouth daily.        Objective:   BP 96/62   Pulse 67   Temp (!) 97.5 F (36.4 C) (Temporal)   Ht _0  (1.778 m)   Wt 234 lb (106.1 kg)   SpO2 94%   BMI 33.58 kg/m   Wt Readings  from Last 3 Encounters:  05/05/20 234 lb (106.1 kg)  03/08/20 225 lb 4 oz (102.2 kg)  01/27/20 212 lb (96.2 kg)    Physical Exam Vitals and nursing note reviewed.  Constitutional:      General: He is not in acute distress.    Appearance: He is well-developed. He is not diaphoretic.  Eyes:     General: No scleral icterus.    Conjunctiva/sclera: Conjunctivae normal.  Neck:     Thyroid: No thyromegaly.  Cardiovascular:     Rate and Rhythm: Normal rate and regular rhythm.     Heart sounds: Normal heart sounds. No murmur heard.   Pulmonary:     Effort: Pulmonary effort is normal. No respiratory distress.     Breath sounds: Normal breath sounds. No wheezing.  Musculoskeletal:        General: Normal range of motion.     Cervical back: Neck supple.  Lymphadenopathy:     Cervical: No cervical adenopathy.  Skin:    General: Skin is warm and dry.     Findings: No rash.  Neurological:     Mental Status: He is alert and oriented to person, place, and time.     Coordination: Coordination normal.  Psychiatric:        Behavior: Behavior normal.       Assessment & Plan:   Problem List Items Addressed This Visit      Respiratory   COPD (chronic obstructive pulmonary disease) with emphysema (HCC)     Digestive   GERD (gastroesophageal reflux disease)     Genitourinary   BPH (benign prostatic hyperplasia)   Relevant Orders   PSA, total and free (Completed)     Other   Prediabetes - Primary   Relevant Orders   Bayer DCA Hb A1c Waived (Completed)   CMP14+EGFR (Completed)      Will check blood work, he says his breathing is doing well, continue current medication, no changes.  Discussed his blood pressure being lower but he denies lightheadedness and dizziness and gets a lot of relief from the doxazosin with his bladder and prostate, continue doxazosin for now but watch out for lightheadedness or dizziness Follow up plan: Return in about 6 months (around 11/04/2020), or if  symptoms worsen or fail to improve, for Prediabetes and COPD.  Counseling provided for all of the vaccine components Orders Placed This Encounter  Procedures  . Bayer DCA Hb A1c Waived  . PSA, total and free  . [place], MD [facility] 05/05/2020, 10:58 AM

## 2020-05-06 ENCOUNTER — Ambulatory Visit: Payer: Medicare Other | Admitting: *Deleted

## 2020-05-06 ENCOUNTER — Other Ambulatory Visit: Payer: Self-pay | Admitting: Family Medicine

## 2020-05-06 DIAGNOSIS — J439 Emphysema, unspecified: Secondary | ICD-10-CM

## 2020-05-06 LAB — CMP14+EGFR
ALT: 11 IU/L (ref 0–44)
AST: 12 IU/L (ref 0–40)
Albumin/Globulin Ratio: 1.8 (ref 1.2–2.2)
Albumin: 4.1 g/dL (ref 3.7–4.7)
Alkaline Phosphatase: 91 IU/L (ref 48–121)
BUN/Creatinine Ratio: 23 (ref 10–24)
BUN: 20 mg/dL (ref 8–27)
Bilirubin Total: 0.2 mg/dL (ref 0.0–1.2)
CO2: 22 mmol/L (ref 20–29)
Calcium: 9.2 mg/dL (ref 8.6–10.2)
Chloride: 104 mmol/L (ref 96–106)
Creatinine, Ser: 0.87 mg/dL (ref 0.76–1.27)
GFR calc Af Amer: 98 mL/min/{1.73_m2} (ref >59)
GFR calc non Af Amer: 85 mL/min/{1.73_m2} (ref >59)
Globulin, Total: 2.3 g/dL (ref 1.5–4.5)
Glucose: 107 mg/dL — ABNORMAL HIGH (ref 65–99)
Potassium: 4.4 mmol/L (ref 3.5–5.2)
Sodium: 138 mmol/L (ref 134–144)
Total Protein: 6.4 g/dL (ref 6.0–8.5)

## 2020-05-06 LAB — PSA, TOTAL AND FREE
PSA, Free Pct: 24.5 %
PSA, Free: 0.49 ng/mL
Prostate Specific Ag, Serum: 2 ng/mL (ref 0.0–4.0)

## 2020-05-06 NOTE — Chronic Care Management (AMB) (Signed)
  Care Management   Note  05/06/2020 Name: Jaquin Coy MRN: [id number] DOB: 1946-06-25  I spoke with patient's wife a few days ago during a CCM call for her. She mentioned that they are having trouble with the cost of Spiriva. I recommended that they apply for Medicare Extra Help and I am mailing them more information on that today. I also reached out to [facility] Pharm[facility] regarding PAP for Spiriva.  Chong Sicilian, BSN, RN-BC Embedded Chronic Care Manager [facility] [place] [facility] Direct Dial: [phone]

## 2020-05-10 ENCOUNTER — Telehealth: Payer: Self-pay | Admitting: Family Medicine

## 2020-05-10 DIAGNOSIS — J439 Emphysema, unspecified: Secondary | ICD-10-CM

## 2020-05-10 MED ORDER — MONTELUKAST SODIUM 10 MG PO TABS: 10.0000 mg | ORAL_TABLET | Freq: Every day | ORAL | 1 refills | Status: DC

## 2020-05-10 NOTE — Telephone Encounter (Signed)
  Prescription Request  05/10/2020  What is the name of the medication or equipment? Singular-Generic?  Have you contacted your pharmacy to request a refill? (if applicable) Yes  Which pharmacy would you like this sent to? [facility]   Patient notified that their request is being sent to the clinical staff for review and that they should receive a response within 2 business days.   [facility] pt.  Please call pt.

## 2020-05-10 NOTE — Telephone Encounter (Signed)
Aware refill sent to pharmacy ?

## 2020-05-18 ENCOUNTER — Ambulatory Visit: Payer: Medicare Other | Admitting: *Deleted

## 2020-05-18 NOTE — Chronic Care Management (AMB) (Signed)
  Care Management   Prescription Assistance Note  05/18/2020 Name: Craig Hurley MRN: [id number] DOB: 1946/04/25  Patient needs help with medication cost. Previously provided information on Medicare Extra Help. Wife determined that they would not qualify for assistance through that program. Discussed with Lottie Dawson, PharmD. She recommended that they schedule an appt with her discuss patient assistance program options. Advised wife of this. She has been in contact with a prescription assistance program that she has found on her own and will call [facility] to schedule [place] an appointment with Almyra Free if that program doesn't work out.    Chong Sicilian, BSN, RN-BC Embedded Chronic Care Manager [facility] [place] [facility] Direct Dial: [phone]

## 2020-05-20 ENCOUNTER — Other Ambulatory Visit: Payer: Self-pay

## 2020-05-20 ENCOUNTER — Ambulatory Visit (INDEPENDENT_AMBULATORY_CARE_PROVIDER_SITE_OTHER): Payer: Medicare Other | Admitting: Pharmacist

## 2020-05-20 DIAGNOSIS — J441 Chronic obstructive pulmonary disease with (acute) exacerbation: Secondary | ICD-10-CM

## 2020-05-20 NOTE — Progress Notes (Signed)
HPI Patient presents today to see pharmacy team for COPD (medication management and assistance).  Past medical history includes COPD (former smoker)  Number of hospitalizations in past year: 0 Number of COPD/asthma exacerbations in past year: 0  Respiratory Medications Current: advair diskus, spiriva handihaler, ventolin rescue Tried in past: n/a Patient reports adherence challenges; only using advair once daily  OBJECTIVE Allergies  Allergen Reactions   Penicillins Swelling    Outpatient Encounter Medications as of 05/20/2020  Medication Sig   acetaminophen (TYLENOL) 325 MG tablet Take 650 mg by mouth every 6 (six) hours as needed.   albuterol (VENTOLIN HFA) 108 (90 Base) MCG/ACT inhaler Inhale 2 puffs into the lungs every 4 (four) hours as needed for wheezing or shortness of breath.   atorvastatin (LIPITOR) 10 MG tablet Take 1 tablet by mouth once daily   cetirizine (ZYRTEC) 10 MG tablet Take 1 tablet (10 mg total) by mouth daily.   doxazosin (CARDURA) 4 MG tablet Take 1 tablet by mouth once daily   Fluticasone-Salmeterol (ADVAIR) 250-50 MCG/DOSE AEPB INHALE 1 DOSE BY MOUTH TWICE DAILY   meloxicam (MOBIC) 15 MG tablet Take 1 tablet by mouth once daily   montelukast (SINGULAIR) 10 MG tablet Take 1 tablet (10 mg total) by mouth at bedtime.   Multiple Vitamins-Minerals (EYE VITAMINS PO) Take by mouth.   omeprazole (PRILOSEC) 20 MG capsule TAKE 1 CAPSULE BY MOUTH TWICE DAILY BEFORE MEAL(S)   rivaroxaban (XARELTO) 20 MG TABS tablet Take 1 tablet (20 mg total) by mouth daily with supper.   tiotropium (SPIRIVA HANDIHALER) 18 MCG inhalation capsule Place 1 capsule (18 mcg total) into inhaler and inhale daily.   vitamin A 8000 UNIT capsule Take 8,000 Units by mouth daily.   No facility-administered encounter medications on file as of 05/20/2020.     Immunization History  Administered Date(s) Administered   Fluad Quad(high Dose 65+) 08/06/2019   H1N1 01/14/2009    Influenza Split 12/25/2005, 10/12/2009, 11/07/2010, 07/18/2011, 11/14/2012, 08/30/2013   Influenza, High Dose Seasonal PF 09/28/2017, 09/11/2018   Influenza,inj,Quad PF,6+ Mos 09/12/2014, 08/18/2015, 10/25/2016   Influenza-Unspecified 09/11/2018   Moderna SARS-COVID-2 Vaccination 02/06/2020, 03/09/2020   Pneumococcal Conjugate-13 01/18/2015   Pneumococcal Polysaccharide-23 01/12/2011   Td 11/22/1998, 07/11/2018   Tdap 01/08/2008   Zoster 01/17/2012     PFTs No flowsheet data found.   Eosinophils Most recent blood eosinophil count was : n/a  IgE   Assessment   1. Inhaler Optimization  Optimal inhaler for patient would be MDIs considering patient's ability to inhale.  Patient is currently on spiriva handihaler and advair diskus  Patient was counseled on the purpose, proper use, and adverse effects of Spiriva Respimat & Symbicort inhalers.  Instructed patient to rinse mouth with water after using (Symbicort) in order to prevent fungal infection.  Patient verbalized understanding.  Reviewed appropriate use of maintenance vs rescue inhalers.  Stressed importance of using maintenance inhaler daily and rescue inhaler only as needed.  Patient verbalized understanding.  Demonstrated proper inhaler technique using Spiriva Respimat demo inhaler.  Patient able to demonstrate proper inhaler technique using teach back method. Patient was given sample in office today.  His inhaler was primed and able to administer first dose in office without issue.  2. Medication Reconciliation  A drug regimen assessment was performed, including review of allergies, interactions, disease-state management, dosing and immunization history. Medications were reviewed with the patient, including name, instructions, indication, goals of therapy, potential side effects, importance of adherence, and safe use.  Drug  interaction(s): N/A  3. Immunizations  Patient is indicated for the influenzae, pneumonia,  and shingles vaccinations.  PLAN  SWITCH TO SPIRIVA RESPIMAT (patient was on spriva handihaler)  Sample given #14 days   YPP#[id number]  EXP4/22  PAP completed for financial assistance (awaiting patient financials)   [place] MDI VS DPI  Will complete PAP for AZ& Me   XARELTO 20mg  samples given--no PAP for this that patient qualifies for at this time  LOT#[id number], EXP 11/21  APPLIED FOR PAP AZ ME NEED TO FILL OUT FILLED OUT BI FOR [place] All questions encouraged and answered.  Instructed patient to reach out with any further questions or concerns.  Thank you for allowing pharmacy to participate in this patient's care.  This appointment required 30 minutes of patient care (this includes precharting, chart review, review of results, face-to-face care, etc.).  Regina Eck, PharmD, BCPS Clinical Pharmacist, [place]  II Phone [phone]

## 2020-05-24 ENCOUNTER — Telehealth: Payer: Medicare Other

## 2020-05-28 ENCOUNTER — Telehealth: Payer: Self-pay | Admitting: Family Medicine

## 2020-05-28 NOTE — Telephone Encounter (Signed)
Patient approved for AZ&me (symbicort) on 05/27/20 Still waiting on Spiriva through [place] program Patient and wife verbalize understanding Consider switching to [facility] for triple therapy

## 2020-06-01 ENCOUNTER — Telehealth: Payer: Self-pay | Admitting: Pharmacist

## 2020-06-01 NOTE — Telephone Encounter (Signed)
Patient has been approved for free Spiriva Respimat through the [place] program until 11/26/20 Patient has been notified and inhalers will ship to patient's home

## 2020-06-15 ENCOUNTER — Telehealth: Payer: Self-pay | Admitting: Pharmacist

## 2020-06-15 NOTE — Telephone Encounter (Signed)
Approval letter received on 7/721 Patient approved for Spiriva respimat patient assistance program via [employer] cares Approved until 11/26/20 Medication to ship to patient's home

## 2020-06-27 ENCOUNTER — Other Ambulatory Visit: Payer: Self-pay | Admitting: Family Medicine

## 2020-07-02 DIAGNOSIS — H353132 Nonexudative age-related macular degeneration, bilateral, intermediate dry stage: Secondary | ICD-10-CM | POA: Diagnosis not present

## 2020-07-02 DIAGNOSIS — H5203 Hypermetropia, bilateral: Secondary | ICD-10-CM | POA: Diagnosis not present

## 2020-07-02 DIAGNOSIS — H26493 Other secondary cataract, bilateral: Secondary | ICD-10-CM | POA: Diagnosis not present

## 2020-07-02 DIAGNOSIS — Z961 Presence of intraocular lens: Secondary | ICD-10-CM | POA: Diagnosis not present

## 2020-07-02 DIAGNOSIS — H52223 Regular astigmatism, bilateral: Secondary | ICD-10-CM | POA: Diagnosis not present

## 2020-07-19 ENCOUNTER — Ambulatory Visit (INDEPENDENT_AMBULATORY_CARE_PROVIDER_SITE_OTHER): Payer: Medicare Other

## 2020-07-19 DIAGNOSIS — Z0000 Encounter for general adult medical examination without abnormal findings: Secondary | ICD-10-CM

## 2020-07-19 NOTE — Progress Notes (Signed)
MEDICARE ANNUAL WELLNESS VISIT  07/19/2020  Telephone Visit Disclaimer This Medicare AWV was conducted by telephone due to national recommendations for restrictions regarding the COVID-19 Pandemic (e.g. social distancing).  I verified, using two identifiers, that I am speaking with Craig Hurley or their authorized healthcare agent. I discussed the limitations, risks, security, and privacy concerns of performing an evaluation and management service by telephone and the potential availability of an in-person appointment in the future. The patient expressed understanding and agreed to proceed.   Subjective:  Craig Hurley is a 74 y.o. male patient of Dettinger, Fransisca Kaufmann, MD who had a Medicare Annual Wellness Visit today via telephone. Craig Hurley was located at his home and I was located here in the office at [place] during the telephone visit. Craig Hurley lives in nearby [place] with his wife and grand daughter. He and his wife have 2 sons. 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 He tries to walk for exercise daily up and down his driveway and down the road he lives on. His health maintenance is up to date and he states that he stays pretty active.  Patient Care Team: Dettinger, Fransisca Kaufmann, MD as PCP - General (Family Medicine) Lavera Guise, [facility] (Pharmacist)  Advanced Directives 07/19/2020 07/14/2019 11/05/2018 07/11/2018 07/05/2017 11/23/2015  Does Patient Have a Medical Advance Directive? Yes Yes No Yes No No  Type of Advance Directive Living will Living will - [place];Living will - -  Does patient want to make changes to medical advance directive? No - Patient declined No - Patient declined - - - -  Copy of Press photographer in Chart? - - - No - copy requested - -  Would patient like information on creating a medical advance directive? - - - Yes (MAU/Ambulatory/Procedural  Areas - Information given) Yes (MAU/Ambulatory/Procedural Areas - Information given) -    Hospital Utilization Over the Past 12 Months: # of hospitalizations or ER visits: 0 # of surgeries: 0  Review of Systems    Patient reports that his overall health is unchanged compared to last year.  History obtained from chart review  Patient Reported Readings (BP, Pulse, CBG, Weight, etc) none  Pain Assessment Pain : No/denies pain     Current Medications & Allergies (verified) Allergies as of 07/19/2020      Reactions   Penicillins Swelling      Medication List       Accurate as of July 19, 2020  8:50 AM. If you have any questions, ask your nurse or doctor.        acetaminophen 325 MG tablet Commonly known as: TYLENOL Take 650 mg by mouth every 6 (six) hours as needed.   albuterol 108 (90 Base) MCG/ACT inhaler Commonly known as: VENTOLIN HFA Inhale 2 puffs into the lungs every 4 (four) hours as needed for wheezing or shortness of breath.   atorvastatin 10 MG tablet Commonly known as: LIPITOR Take 1 tablet by mouth once daily   budesonide-formoterol 160-4.5 MCG/ACT inhaler Commonly known as: SYMBICORT Inhale 2 puffs into the lungs 2 (two) times daily.   cetirizine 10 MG tablet Commonly known as: ZYRTEC Take 1 tablet (10 mg total) by mouth daily.   doxazosin 4 MG tablet Commonly known as: CARDURA Take 1 tablet by mouth once daily   EYE VITAMINS PO Take by mouth.   meloxicam 15 MG  tablet Commonly known as: MOBIC Take 1 tablet by mouth once daily   montelukast 10 MG tablet Commonly known as: SINGULAIR Take 1 tablet (10 mg total) by mouth at bedtime.   omeprazole 20 MG capsule Commonly known as: PRILOSEC TAKE 1 CAPSULE BY MOUTH TWICE DAILY BEFORE MEAL(S)   rivaroxaban 20 MG Tabs tablet Commonly known as: XARELTO Take 1 tablet (20 mg total) by mouth daily with supper.   Spiriva Respimat 2.5 MCG/ACT Aers Generic drug: Tiotropium Bromide  Monohydrate Inhale 2 puffs into the lungs daily.   vitamin A 8000 UNIT capsule Take 8,000 Units by mouth daily.       History (reviewed): Past Medical History:  Diagnosis Date  . Allergy   . Arthritis   . Asthma   . Cataract    Past Surgical History:  Procedure Laterality Date  . BACK SURGERY  2005   lumbar  . EYE SURGERY     cataracts  . FRACTURE SURGERY Right   . SHOULDER ARTHROSCOPY Right   . SKIN GRAFT FULL THICKNESS TRUNK    . VASECTOMY     Family History  Adopted: Yes  Problem Relation Age of Onset  . Cancer Mother        colon cancer   Social History   Socioeconomic History  . Marital status: Married    Spouse name: Retired  . Number of children: 2  . Years of education: 13  . Highest education level: 12th grade  Occupational History  . Occupation: Retired  Tobacco Use  . Smoking status: Former Smoker    Types: Cigars  . Smokeless tobacco: Current User    Types: Snuff  Vaping Use  . Vaping Use: Never used  Substance and Sexual Activity  . Alcohol use: No  . Drug use: No  . Sexual activity: Yes  Other Topics Concern  . Not on file  Social History Narrative  . Not on file   Social Determinants of Health   Financial Resource Strain:   . Difficulty of Paying Living Expenses: Not on file  Food Insecurity:   . Worried About Charity fundraiser in the Last Year: Not on file  . Ran Out of Food in the Last Year: Not on file  Transportation Needs:   . Lack of Transportation (Medical): Not on file  . Lack of Transportation (Non-Medical): Not on file  Physical Activity:   . Days of Exercise per Week: Not on file  . Minutes of Exercise per Session: Not on file  Stress:   . Feeling of Stress : Not on file  Social Connections:   . Frequency of Communication with Friends and Family: Not on file  . Frequency of Social Gatherings with Friends and Family: Not on file  . Attends Religious Services: Not on file  . Active Member of Clubs or  Organizations: Not on file  . Attends Archivist Meetings: Not on file  . Marital Status: Not on file    Activities of Daily Living In your present state of health, do you have any difficulty performing the following activities: 07/19/2020  Hearing? Y  Comment Wears hearing aids in both ears  Vision? Y  Comment Wears glasses all the time  Difficulty concentrating or making decisions? N  Walking or climbing stairs? N  Dressing or bathing? N  Doing errands, shopping? N  Preparing Food and eating ? N  Using the Toilet? N  In the past six months, have you accidently leaked urine?  N  Do you have problems with loss of bowel control? N  Managing your Medications? N  Managing your Finances? N  Housekeeping or managing your Housekeeping? N  Some recent data might be hidden    Patient Education/ Literacy How often do you need to have someone help you when you read instructions, pamphlets, or other written materials from your doctor or pharmacy?: 1 - Never What is the last grade level you completed in school?: 10th grade  Exercise Current Exercise Habits: The patient does not participate in regular exercise at present, Exercise limited by: None identified  Diet Patient reports consuming 3 meals a day and 2 snack(s) a day Patient reports that his primary diet is: Regular Patient reports that she does have regular access to food.   Depression Screen PHQ 2/9 Scores 07/19/2020 05/05/2020 03/08/2020 01/27/2020 07/14/2019 05/01/2019 01/23/2019  PHQ - 2 Score 0 0 0 0 0 0 0     Fall Risk Fall Risk  07/19/2020 05/05/2020 03/08/2020 01/27/2020 07/14/2019  Falls in the past year? 0 0 1 0 0  Number falls in past yr: - - 0 - 0  Injury with Fall? - - 0 - 0     Objective:  Craig Hurley seemed alert and oriented and he participated appropriately during our telephone visit.  Blood Pressure Weight BMI  BP Readings from Last 3 Encounters:  05/05/20 96/62  03/08/20 103/70  01/27/20 91/68   Wt  Readings from Last 3 Encounters:  05/05/20 234 lb (106.1 kg)  03/08/20 225 lb 4 oz (102.2 kg)  01/27/20 212 lb (96.2 kg)   BMI Readings from Last 1 Encounters:  05/05/20 33.58 kg/m    *Unable to obtain current vital signs, weight, and BMI due to telephone visit type  Hearing/Vision  . Craig Hurley did not seem to have difficulty with hearing/understanding during the telephone conversation . Reports that he has had a formal eye exam by an eye care professional within the past year . Reports that he has had a formal hearing evaluation within the past year *Unable to fully assess hearing and vision during telephone visit type  Cognitive Function: 6CIT Screen 07/19/2020 07/14/2019  What Year? 0 points 0 points  What month? 0 points 0 points  What time? 0 points 0 points  Count back from 20 0 points 0 points  Months in reverse 0 points 2 points  Repeat phrase 0 points 2 points  Total Score 0 4   (Normal:0-7, Significant for Dysfunction: >8)  Normal Cognitive Function Screening: Yes   Immunization & Health Maintenance Record Immunization History  Administered Date(s) Administered  . Fluad Quad(high Dose 65+) 08/06/2019  . H1N1 01/14/2009  . Influenza Split 12/25/2005, 10/12/2009, 11/07/2010, 07/18/2011, 11/14/2012, 08/30/2013  . Influenza, High Dose Seasonal PF 09/28/2017, 09/11/2018  . Influenza,inj,Quad PF,6+ Mos 09/12/2014, 08/18/2015, 10/25/2016  . Influenza-Unspecified 09/11/2018  . Moderna SARS-COVID-2 Vaccination 02/06/2020, 03/09/2020  . Pneumococcal Conjugate-13 01/18/2015  . Pneumococcal Polysaccharide-23 01/12/2011  . Td 11/22/1998, 07/11/2018  . Tdap 01/08/2008  . Zoster 01/17/2012    Health Maintenance  Topic Date Due  . INFLUENZA VACCINE  06/27/2020  . COLONOSCOPY  05/24/2025  . TETANUS/TDAP  07/11/2028  . COVID-19 Vaccine  Completed  . Hepatitis C Screening  Completed  . PNA vac Low Risk Adult  Addressed       Assessment  This is a routine wellness  examination for Craig Hurley.  Health Maintenance: Due or Overdue Health Maintenance Due  Topic Date Due  . INFLUENZA  VACCINE  06/27/2020    Craig Hurley does not need a referral for Community Assistance: Care Management:   no Social Work:    no Prescription Assistance:  no Nutrition/Diabetes Education:  no   Plan:  Personalized Goals Goals Addressed   None    Personalized Health Maintenance & Screening Recommendations  Influenza vaccine  Lung Cancer Screening Recommended: no (Low Dose CT Chest recommended if Age 34-80 years, 30 pack-year currently smoking OR have quit w/in past 15 years) Hepatitis C Screening recommended: Done HIV Screening recommended: no  Advanced Directives: Written information was not prepared per patient's request.  Referrals & Orders No orders of the defined types were placed in this encounter.   Follow-up Plan . Follow-up with Dettinger, Fransisca Kaufmann, MD as planned   I have personally reviewed and noted the following in the patient's chart:   . Medical and social history . Use of alcohol, tobacco or illicit drugs  . Current medications and supplements . Functional ability and status . Nutritional status . Physical activity . Advanced directives . List of other physicians . Hospitalizations, surgeries, and ER visits in previous 12 months . Vitals . Screenings to include cognitive, depression, and falls . Referrals and appointments  In addition, I have reviewed and discussed with Craig Hurley certain preventive protocols, quality metrics, and best practice recommendations. A written personalized care plan for preventive services as well as general preventive health recommendations is available and can be mailed to the patient at his request.      Rolena Infante LPN [phone]

## 2020-07-19 NOTE — Patient Instructions (Signed)
°  [name] , Thank you for taking time to come for your Medicare Wellness Visit. I appreciate your ongoing commitment to your health goals. Please review the following plan we discussed and let me know if I can assist you in the future.   These are the goals we discussed: Goals     Exercise 3x per week (30 min per time)     Exercise 3x per week (30 min per time)     Have 3 meals a day       This is a list of the screening recommended for you and due dates:  Health Maintenance  Topic Date Due   Flu Shot  06/27/2020   Colon Cancer Screening  05/24/2025   Tetanus Vaccine  07/11/2028   COVID-19 Vaccine  Completed    Hepatitis C: One time screening is recommended by Center for Disease Control  (CDC) for  adults born from [birth year] through 1966.   Completed   Pneumonia vaccines  Addressed

## 2020-07-27 ENCOUNTER — Encounter: Payer: Self-pay | Admitting: Family Medicine

## 2020-08-05 ENCOUNTER — Other Ambulatory Visit: Payer: Self-pay | Admitting: Family Medicine

## 2020-08-05 DIAGNOSIS — K219 Gastro-esophageal reflux disease without esophagitis: Secondary | ICD-10-CM

## 2020-08-19 ENCOUNTER — Encounter: Payer: Self-pay | Admitting: Family Medicine

## 2020-09-07 ENCOUNTER — Other Ambulatory Visit: Payer: Self-pay | Admitting: Family Medicine

## 2020-09-22 ENCOUNTER — Other Ambulatory Visit: Payer: Self-pay

## 2020-09-22 ENCOUNTER — Ambulatory Visit (INDEPENDENT_AMBULATORY_CARE_PROVIDER_SITE_OTHER): Payer: Medicare Other | Admitting: Family Medicine

## 2020-09-22 ENCOUNTER — Encounter: Payer: Self-pay | Admitting: Family Medicine

## 2020-09-22 VITALS — BP 116/80 | HR 103 | Temp 98.0°F | Ht 70.0 in | Wt 250.0 lb

## 2020-09-22 DIAGNOSIS — G4489 Other headache syndrome: Secondary | ICD-10-CM

## 2020-09-22 DIAGNOSIS — Z23 Encounter for immunization: Secondary | ICD-10-CM

## 2020-09-22 NOTE — Progress Notes (Signed)
BP 116/80   Pulse (!) 103   Temp 98 F (36.7 C)   Ht 5\' 10"  (1.778 m)   Wt 250 lb (113.4 kg)   SpO2 95%   BMI 35.87 kg/m    Subjective:   [name], male    DOB: 09-22-1946, 74 y.o.   MRN: [id number]  HPI: Craig Hurley is a 74 y.o. male presenting on 09/22/2020 for Headache (2-3 x per week. Dull pain on left side of scalp)   HPI Patient is coming in complaining of headache syndrome is been going on for a couple months.  He says it gets the frequent headaches 2-3 times per week, they do not last all day and Tylenol has been working for them.  He describes them as a dull left-sided headaches that are not severe but have been going on very frequently.  He gets concerned because he does have a history of stroke.  He denies any nausea or vomiting or difficulties with lights or sounds.  Patient denies any focal numbness or weakness or visual changes.  He says is very dull and nagging and sometimes happens at night and sometimes happens during the day.  He denies any specific triggers that bring it on that he is noticed.  Relevant past medical, surgical, family and social history reviewed and updated as indicated. Interim medical history since our last visit reviewed. Allergies and medications reviewed and updated.  Review of Systems  Constitutional: Negative for chills and fever.  Eyes: Negative for discharge and visual disturbance.  Respiratory: Negative for shortness of breath and wheezing.   Cardiovascular: Negative for chest pain and leg swelling.  Endocrine: Negative for cold intolerance and heat intolerance.  Musculoskeletal: Negative for back pain and gait problem.  Skin: Negative for rash.  Neurological: Positive for headaches. Negative for dizziness, weakness, light-headedness and numbness.  All other systems reviewed and are negative.   Per HPI unless specifically indicated above   Allergies as of 09/22/2020      Reactions   Penicillins Swelling       Medication List       Accurate as of September 22, 2020 11:11 AM. If you have any questions, ask your nurse or doctor.        acetaminophen 325 MG tablet Commonly known as: TYLENOL Take 650 mg by mouth every 6 (six) hours as needed.   albuterol 108 (90 Base) MCG/ACT inhaler Commonly known as: VENTOLIN HFA Inhale 2 puffs into the lungs every 4 (four) hours as needed for wheezing or shortness of breath.   atorvastatin 10 MG tablet Commonly known as: LIPITOR Take 1 tablet by mouth once daily   budesonide-formoterol 160-4.5 MCG/ACT inhaler Commonly known as: SYMBICORT Inhale 2 puffs into the lungs 2 (two) times daily.   cetirizine 10 MG tablet Commonly known as: ZYRTEC Take 1 tablet (10 mg total) by mouth daily.   doxazosin 4 MG tablet Commonly known as: CARDURA Take 1 tablet by mouth once daily   EYE VITAMINS PO Take by mouth.   meloxicam 15 MG tablet Commonly known as: MOBIC Take 1 tablet by mouth once daily   montelukast 10 MG tablet Commonly known as: SINGULAIR Take 1 tablet (10 mg total) by mouth at bedtime.   omeprazole 20 MG capsule Commonly known as: PRILOSEC TAKE 1 CAPSULE BY MOUTH TWICE DAILY BEFORE MEAL(S)   Spiriva Respimat 2.5 MCG/ACT Aers Generic drug: Tiotropium Bromide Monohydrate Inhale 2 puffs into the lungs daily.   vitamin A  8000 UNIT capsule Take 8,000 Units by mouth daily.   Xarelto 20 MG Tabs tablet Generic drug: rivaroxaban TAKE 1 TABLET BY MOUTH ONCE DAILY WITH SUPPER        Objective:   BP 116/80   Pulse (!) 103   Temp 98 F (36.7 C)   Ht 5\' 10"  (1.778 m)   Wt 250 lb (113.4 kg)   SpO2 95%   BMI 35.87 kg/m   Wt Readings from Last 3 Encounters:  09/22/20 250 lb (113.4 kg)  05/05/20 234 lb (106.1 kg)  03/08/20 225 lb 4 oz (102.2 kg)    Physical Exam Vitals and nursing note reviewed.  Constitutional:      General: He is not in acute distress.    Appearance: He is well-developed. He is not diaphoretic.  HENT:      Head:     Comments: Patient has slightly tenderness to palpation in left temporal region just above his ear on that side.  No visible skin changes or rash Eyes:     General: No scleral icterus.       Right eye: No discharge.     Extraocular Movements: Extraocular movements intact.     Conjunctiva/sclera: Conjunctivae normal.     Pupils: Pupils are equal, round, and reactive to light.  Neck:     Thyroid: No thyromegaly.  Cardiovascular:     Rate and Rhythm: Normal rate and regular rhythm.     Heart sounds: Normal heart sounds. No murmur heard.   Pulmonary:     Effort: Pulmonary effort is normal. No respiratory distress.     Breath sounds: Normal breath sounds. No wheezing.  Musculoskeletal:        General: Normal range of motion.     Cervical back: Neck supple.  Lymphadenopathy:     Cervical: No cervical adenopathy.  Skin:    General: Skin is warm and dry.     Findings: No rash.  Neurological:     Mental Status: He is alert and oriented to person, place, and time.     Coordination: Coordination normal.  Psychiatric:        Behavior: Behavior normal.       Assessment & Plan:   Problem List Items Addressed This Visit    None    Visit Diagnoses    Headache syndrome    -  Primary   Relevant Orders   CBC with Differential/Platelet   Sedimentation rate   High sensitivity CRP   Ambulatory referral to Neurology   Need for immunization against influenza       Relevant Orders   Flu Vaccine QUAD High Dose(Fluad) (Completed)      Left temporal headaches, concern for possible temporal arteritis although not palpable, will do sed rate and CRP. Follow up plan: Return if symptoms worsen or fail to improve.  Counseling provided for all of the vaccine components Orders Placed This Encounter  Procedures  . Flu Vaccine QUAD High Dose(Fluad)    Caryl Pina, MD [facility] 09/22/2020, 11:11 AM

## 2020-09-23 LAB — SEDIMENTATION RATE: Sed Rate: 69 mm/hr — ABNORMAL HIGH (ref 0–30)

## 2020-09-23 LAB — CBC WITH DIFFERENTIAL/PLATELET
Basophils Absolute: 0.1 10*3/uL (ref 0.0–0.2)
Basos: 1 %
EOS (ABSOLUTE): 0.1 10*3/uL (ref 0.0–0.4)
Eos: 1 %
Hematocrit: 47.6 % (ref 37.5–51.0)
Hemoglobin: 15.8 g/dL (ref 13.0–17.7)
Immature Grans (Abs): 0 10*3/uL (ref 0.0–0.1)
Immature Granulocytes: 0 %
Lymphocytes Absolute: 1.5 10*3/uL (ref 0.7–3.1)
Lymphs: 15 %
MCH: 28.2 pg (ref 26.6–33.0)
MCHC: 33.2 g/dL (ref 31.5–35.7)
MCV: 85 fL (ref 79–97)
Monocytes Absolute: 0.8 10*3/uL (ref 0.1–0.9)
Monocytes: 8 %
Neutrophils Absolute: 7.3 10*3/uL — ABNORMAL HIGH (ref 1.4–7.0)
Neutrophils: 75 %
Platelets: 200 10*3/uL (ref 150–450)
RBC: 5.61 x10E6/uL (ref 4.14–5.80)
RDW: 13.8 % (ref 11.6–15.4)
WBC: 9.6 10*3/uL (ref 3.4–10.8)

## 2020-09-23 LAB — HIGH SENSITIVITY CRP: CRP, High Sensitivity: 5.33 mg/L — ABNORMAL HIGH (ref 0.00–3.00)

## 2020-09-27 ENCOUNTER — Encounter: Payer: Self-pay | Admitting: Family Medicine

## 2020-10-05 ENCOUNTER — Other Ambulatory Visit: Payer: Self-pay | Admitting: *Deleted

## 2020-10-05 DIAGNOSIS — J439 Emphysema, unspecified: Secondary | ICD-10-CM

## 2020-10-05 DIAGNOSIS — K219 Gastro-esophageal reflux disease without esophagitis: Secondary | ICD-10-CM

## 2020-10-05 MED ORDER — MELOXICAM 15 MG PO TABS
15.0000 mg | ORAL_TABLET | Freq: Every day | ORAL | 1 refills | Status: DC
Start: 2020-10-05 — End: 2020-11-04

## 2020-10-05 MED ORDER — MONTELUKAST SODIUM 10 MG PO TABS: 10.0000 mg | ORAL_TABLET | Freq: Every day | ORAL | 1 refills | Status: DC

## 2020-10-05 MED ORDER — OMEPRAZOLE 20 MG PO CPDR: DELAYED_RELEASE_CAPSULE | ORAL | 1 refills | Status: DC

## 2020-10-05 MED ORDER — ATORVASTATIN CALCIUM 10 MG PO TABS
10.0000 mg | ORAL_TABLET | Freq: Every day | ORAL | 1 refills | Status: DC
Start: 2020-10-05 — End: 2020-11-04

## 2020-10-05 MED ORDER — DOXAZOSIN MESYLATE 4 MG PO TABS
4.0000 mg | ORAL_TABLET | Freq: Every day | ORAL | 1 refills | Status: DC
Start: 2020-10-05 — End: 2020-11-04

## 2020-10-26 ENCOUNTER — Encounter: Payer: Self-pay | Admitting: Family Medicine

## 2020-11-04 ENCOUNTER — Other Ambulatory Visit: Payer: Self-pay

## 2020-11-04 ENCOUNTER — Ambulatory Visit (INDEPENDENT_AMBULATORY_CARE_PROVIDER_SITE_OTHER): Payer: Medicare Other | Admitting: Family Medicine

## 2020-11-04 ENCOUNTER — Encounter: Payer: Self-pay | Admitting: Family Medicine

## 2020-11-04 VITALS — BP 133/79 | HR 69 | Ht 70.0 in | Wt 255.0 lb

## 2020-11-04 DIAGNOSIS — K219 Gastro-esophageal reflux disease without esophagitis: Secondary | ICD-10-CM | POA: Diagnosis not present

## 2020-11-04 DIAGNOSIS — R7303 Prediabetes: Secondary | ICD-10-CM | POA: Diagnosis not present

## 2020-11-04 DIAGNOSIS — Z6834 Body mass index (BMI) 34.0-34.9, adult: Secondary | ICD-10-CM | POA: Diagnosis not present

## 2020-11-04 DIAGNOSIS — E6609 Other obesity due to excess calories: Secondary | ICD-10-CM

## 2020-11-04 DIAGNOSIS — J439 Emphysema, unspecified: Secondary | ICD-10-CM | POA: Diagnosis not present

## 2020-11-04 LAB — CMP14+EGFR
ALT: 11 IU/L (ref 0–44)
AST: 9 IU/L (ref 0–40)
Albumin/Globulin Ratio: 1.6 (ref 1.2–2.2)
Albumin: 4 g/dL (ref 3.7–4.7)
Alkaline Phosphatase: 91 IU/L (ref 44–121)
BUN/Creatinine Ratio: 22 (ref 10–24)
BUN: 22 mg/dL (ref 8–27)
Bilirubin Total: 0.3 mg/dL (ref 0.0–1.2)
CO2: 23 mmol/L (ref 20–29)
Calcium: 9.2 mg/dL (ref 8.6–10.2)
Chloride: 103 mmol/L (ref 96–106)
Creatinine, Ser: 0.99 mg/dL (ref 0.76–1.27)
GFR calc Af Amer: 86 mL/min/{1.73_m2} (ref >59)
GFR calc non Af Amer: 74 mL/min/{1.73_m2} (ref >59)
Globulin, Total: 2.5 g/dL (ref 1.5–4.5)
Glucose: 109 mg/dL — ABNORMAL HIGH (ref 65–99)
Potassium: 4.9 mmol/L (ref 3.5–5.2)
Sodium: 139 mmol/L (ref 134–144)
Total Protein: 6.5 g/dL (ref 6.0–8.5)

## 2020-11-04 LAB — LIPID PANEL
Chol/HDL Ratio: 2.8 ratio (ref 0.0–5.0)
Cholesterol, Total: 141 mg/dL (ref 100–199)
HDL: 50 mg/dL (ref >39)
LDL Chol Calc (NIH): 78 mg/dL (ref 0–99)
Triglycerides: 63 mg/dL (ref 0–149)
VLDL Cholesterol Cal: 13 mg/dL (ref 5–40)

## 2020-11-04 LAB — BAYER DCA HB A1C WAIVED: HB A1C (BAYER DCA - WAIVED): 5.8 % (ref <7.0)

## 2020-11-04 MED ORDER — MELOXICAM 15 MG PO TABS
15.0000 mg | ORAL_TABLET | Freq: Every day | ORAL | 3 refills | Status: DC
Start: 2020-11-04 — End: 2020-12-24

## 2020-11-04 MED ORDER — ATORVASTATIN CALCIUM 10 MG PO TABS
10.0000 mg | ORAL_TABLET | Freq: Every day | ORAL | 3 refills | Status: DC
Start: 2020-11-04 — End: 2021-05-03

## 2020-11-04 MED ORDER — DOXAZOSIN MESYLATE 4 MG PO TABS
4.0000 mg | ORAL_TABLET | Freq: Every day | ORAL | 3 refills | Status: DC
Start: 2020-11-04 — End: 2020-12-24

## 2020-11-04 MED ORDER — MONTELUKAST SODIUM 10 MG PO TABS: 10.0000 mg | ORAL_TABLET | Freq: Every day | ORAL | 3 refills | Status: DC

## 2020-11-04 NOTE — Progress Notes (Signed)
BP 133/79   Pulse 69   Ht 5' 10"  (1.778 m)   Wt 255 lb (115.7 kg)   SpO2 97%   BMI 36.59 kg/m    Subjective:   [name], male    DOB: 05-15-1946, 74 y.o.   MRN: [id number]  HPI: Teresa Lemmerman is a 74 y.o. male presenting on 11/04/2020 for Medical Management of Chronic Issues, Diabetes, and COPD   HPI GERD Patient is currently on omeprazole.  She denies any major symptoms or abdominal pain or belching or burping. She denies any blood in her stool or lightheadedness or dizziness.   COPD Patient is coming in for COPD recheck today.  He is currently on Symbicort and albuterol.  He has a mild chronic cough but denies any major coughing spells or wheezing spells.  He has 0nighttime symptoms per week and 0daytime symptoms per week currently.   Patient has been having some memory issues and some headaches especially on the left side of his head, he has seen neurologist within a month and an he will discuss that with them further.  Patient denies any suicidal ideations or thoughts of hurting self.  Patient says his headaches do wake him up with sleep sometimes but they are very short-lived.  Relevant past medical, surgical, family and social history reviewed and updated as indicated. Interim medical history since our last visit reviewed. Allergies and medications reviewed and updated.  Review of Systems  Constitutional: Negative for chills and fever.  Eyes: Negative for visual disturbance.  Respiratory: Negative for shortness of breath and wheezing.   Cardiovascular: Negative for chest pain and leg swelling.  Musculoskeletal: Negative for back pain and gait problem.  Skin: Negative for rash.  Neurological: Positive for headaches. Negative for dizziness, weakness and light-headedness.  Psychiatric/Behavioral: Positive for confusion.  All other systems reviewed and are negative.   Per HPI unless specifically indicated above   Allergies as of 11/04/2020       Reactions   Penicillins Swelling      Medication List       Accurate as of November 04, 2020 10:54 AM. If you have any questions, ask your nurse or doctor.        acetaminophen 325 MG tablet Commonly known as: TYLENOL Take 650 mg by mouth every 6 (six) hours as needed.   albuterol 108 (90 Base) MCG/ACT inhaler Commonly known as: VENTOLIN HFA Inhale 2 puffs into the lungs every 4 (four) hours as needed for wheezing or shortness of breath.   atorvastatin 10 MG tablet Commonly known as: LIPITOR Take 1 tablet (10 mg total) by mouth daily.   budesonide-formoterol 160-4.5 MCG/ACT inhaler Commonly known as: SYMBICORT Inhale 2 puffs into the lungs 2 (two) times daily.   cetirizine 10 MG tablet Commonly known as: ZYRTEC Take 1 tablet (10 mg total) by mouth daily.   doxazosin 4 MG tablet Commonly known as: CARDURA Take 1 tablet (4 mg total) by mouth daily.   EYE VITAMINS PO Take by mouth.   meloxicam 15 MG tablet Commonly known as: MOBIC Take 1 tablet (15 mg total) by mouth daily.   montelukast 10 MG tablet Commonly known as: SINGULAIR Take 1 tablet (10 mg total) by mouth at bedtime.   omeprazole 20 MG capsule Commonly known as: PRILOSEC TAKE 1 CAPSULE BY MOUTH TWICE DAILY BEFORE MEAL(S)   Spiriva Respimat 2.5 MCG/ACT Aers Generic drug: Tiotropium Bromide Monohydrate Inhale 2 puffs into the lungs daily.   vitamin A 8000  UNIT capsule Take 8,000 Units by mouth daily.   Xarelto 20 MG Tabs tablet Generic drug: rivaroxaban TAKE 1 TABLET BY MOUTH ONCE DAILY WITH SUPPER        Objective:   BP 133/79   Pulse 69   Ht 5' 10"  (1.778 m)   Wt 255 lb (115.7 kg)   SpO2 97%   BMI 36.59 kg/m   Wt Readings from Last 3 Encounters:  11/04/20 255 lb (115.7 kg)  09/22/20 250 lb (113.4 kg)  05/05/20 234 lb (106.1 kg)    Physical Exam Vitals and nursing note reviewed.  Constitutional:      General: He is not in acute distress.    Appearance: He is well-developed and  well-nourished. He is not diaphoretic.  Eyes:     General: No scleral icterus.    Extraocular Movements: EOM normal.     Conjunctiva/sclera: Conjunctivae normal.  Neck:     Thyroid: No thyromegaly.  Cardiovascular:     Rate and Rhythm: Normal rate and regular rhythm.     Pulses: Intact distal pulses.     Heart sounds: Normal heart sounds. No murmur heard.   Pulmonary:     Effort: Pulmonary effort is normal. No respiratory distress.     Breath sounds: Normal breath sounds. No wheezing.  Musculoskeletal:        General: No edema. Normal range of motion.     Cervical back: Neck supple.  Lymphadenopathy:     Cervical: No cervical adenopathy.  Skin:    General: Skin is warm and dry.     Findings: No rash.  Neurological:     Mental Status: He is alert and oriented to person, place, and time.     Coordination: Coordination normal.  Psychiatric:        Mood and Affect: Mood and affect normal.        Behavior: Behavior normal.       Assessment & Plan:   Problem List Items Addressed This Visit      Respiratory   COPD (chronic obstructive pulmonary disease) with emphysema (HCC)   Relevant Medications   montelukast (SINGULAIR) 10 MG tablet     Digestive   GERD (gastroesophageal reflux disease)   Relevant Orders   CMP14+EGFR     Other   Prediabetes - Primary   Relevant Orders   Bayer DCA Hb A1c Waived   Obesity   Relevant Orders   Lipid panel      Patient is going to discuss with the neurologist but I think he may be able to come off of the Xarelto, he has 30 days left and is seeing them within a month and will let me know if he needs refills after that.  Likely to go on aspirin if he comes off Xarelto.  Xarelto was initially started after his stroke and Covid infection. Follow up plan: Return in about 6 months (around 05/05/2021), or if symptoms worsen or fail to improve, for Prediabetes and COPD.  Counseling provided for all of the vaccine components No orders of the  defined types were placed in this encounter.   Caryl Pina, MD [facility] 11/04/2020, 10:54 AM

## 2020-11-10 ENCOUNTER — Other Ambulatory Visit: Payer: Self-pay

## 2020-11-10 ENCOUNTER — Ambulatory Visit (INDEPENDENT_AMBULATORY_CARE_PROVIDER_SITE_OTHER): Payer: Medicare Other

## 2020-11-10 DIAGNOSIS — Z23 Encounter for immunization: Secondary | ICD-10-CM | POA: Diagnosis not present

## 2020-11-10 NOTE — Progress Notes (Signed)
° °  Covid-19 Vaccination Clinic  Name:  Craig Hurley    MRN: [id number] DOB: 09/21/1946  11/10/2020  [name] was observed post Covid-19 immunization for 15 minutes without incident. He was provided with Vaccine Information Sheet and instruction to access the V-Safe system.   [name] was instructed to call 911 with any severe reactions post vaccine:  Difficulty breathing   Swelling of face and throat   A fast heartbeat   A bad rash all over body   Dizziness and weakness   Immunizations Administered    No immunizations on file.

## 2020-11-24 ENCOUNTER — Encounter: Payer: Self-pay | Admitting: Family Medicine

## 2020-12-09 ENCOUNTER — Ambulatory Visit: Payer: Medicare HMO | Admitting: Neurology

## 2020-12-09 ENCOUNTER — Encounter: Payer: Self-pay | Admitting: Neurology

## 2020-12-09 ENCOUNTER — Telehealth: Payer: Self-pay | Admitting: Neurology

## 2020-12-09 VITALS — BP 117/71 | HR 79 | Ht 70.0 in | Wt 255.5 lb

## 2020-12-09 DIAGNOSIS — Z8673 Personal history of transient ischemic attack (TIA), and cerebral infarction without residual deficits: Secondary | ICD-10-CM | POA: Insufficient documentation

## 2020-12-09 DIAGNOSIS — R413 Other amnesia: Secondary | ICD-10-CM | POA: Diagnosis not present

## 2020-12-09 DIAGNOSIS — I6522 Occlusion and stenosis of left carotid artery: Secondary | ICD-10-CM | POA: Insufficient documentation

## 2020-12-09 DIAGNOSIS — I63312 Cerebral infarction due to thrombosis of left middle cerebral artery: Secondary | ICD-10-CM | POA: Diagnosis not present

## 2020-12-09 DIAGNOSIS — R4701 Aphasia: Secondary | ICD-10-CM | POA: Diagnosis not present

## 2020-12-09 MED ORDER — ASPIRIN EC 325 MG PO TBEC: 325.0000 mg | DELAYED_RELEASE_TABLET | Freq: Every day | ORAL | 4 refills | Status: AC

## 2020-12-09 NOTE — Telephone Encounter (Signed)
Craig Hurley: [id number] (exp. 12/09/20 to 01/08/21) order sent to GI. They will reach out to the patient to schedule.

## 2020-12-09 NOTE — Progress Notes (Signed)
Chief Complaint  Patient presents with  . New Patient (Initial Visit)    He is here with his wife, Blanch Media. He started having frequent, left-sided headaches in October 2021. He will occasionally take Tylenol. Hx of stroke (January 2021).    HISTORICAL  Nimai Burbach is a 75 year old male, accompanied by his wife, seen in request by his primary care physician Dr. Warrick Parisian, Fransisca Kaufmann, for evaluation of stroke, initial evaluation was on December 09, 2020.  I reviewed and summarized the referring note.PMHx. Obesity HLD COPD On Xarelto, following his stroke on Dec 25 2019.  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, was given IV tPA, later was transferred to [facility]  I was able to review old [place] of the brain without contrast that showed acute left MCA infarction, positive DWI throughout the frontal temporal, basal ganglia, temporal lobes,  CT angiogram of head and neck on December 26, 2019 showed eccentric thrombosis in the left common carotid artery bulb with extension into the left internal carotid artery with high-grade short segment stenosis,  Carotid Doppler study January 01, 2020 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, but otherwise unremarkable  He was seen by neuro interventional radiologist, no intervention was given  Echocardiogram with bubble study ejection fraction 40 to 45%, no cardiac etiology, EKG showed normal sinus rhythm  Hospital course was complicated by acute hypoxic respiratory failure due to COVID-19, improved with SPO2 95% on 4 L nasal cannula, history of COPD, chest x-ray February 6 showed persistent bilateral multifocal infiltration, elevated liver function that was due to [place], was treated with  Decadron 10 mg daily and remdesivir,  His aphasia only lasted for couple days, but required prolonged rehabilitation for right-sided weakness, wife reported that he is almost back to his baseline, he already had some memory loss, word finding difficulties, slightly worse since his stroke, still able to fairly independent at his daily activity, drives short distance  He continued to take Xarelto, which was given following his stroke on December 25, 2019, his primary care physician and patient are questioning whether he should stay on anticoagulation, he was not on any antiplatelet agent prior to his stroke  Since stroke, he also complains frequent left parietal region headaches, short lasting, few minutes, pressure,  I personally reviewed CT head without contrast on November 05, 2018: No acute abnormality, mild supratentorium small vessel disease Laboratory evaluations in December 2021: LDL 78, CMP creatinine 0.99, A1c 5.8, high-sensitivity C-reactive protein was elevated 5.33, ESR 69, CBC with hemoglobin of 13.1  REVIEW OF SYSTEMS: Full 14 system review of systems performed and notable only for as above All other review of systems were negative.  ALLERGIES: Allergies  Allergen Reactions  . Penicillins Swelling    HOME MEDICATIONS: Current Outpatient Medications  Medication Sig Dispense Refill  . acetaminophen (TYLENOL) 325 MG tablet Take 650 mg by mouth every 6 (six) hours as needed.    Marland Kitchen albuterol (VENTOLIN HFA) 108 (90 Base) MCG/ACT inhaler Inhale 2 puffs into the lungs every 4 (four) hours as needed for wheezing or shortness of breath. 18 g 1  . atorvastatin (LIPITOR) 10 MG tablet Take 1 tablet (10 mg total) by mouth daily. 90 tablet 3  . budesonide-formoterol (SYMBICORT) 160-4.5 MCG/ACT inhaler Inhale 2 puffs  into the lungs 2 (two) times daily.    . cetirizine (ZYRTEC) 10 MG tablet Take 1 tablet (10 mg total) by mouth daily. 90 tablet 5  . doxazosin (CARDURA) 4 MG tablet Take 1 tablet  (4 mg total) by mouth daily. 90 tablet 3  . meloxicam (MOBIC) 15 MG tablet Take 1 tablet (15 mg total) by mouth daily. 90 tablet 3  . montelukast (SINGULAIR) 10 MG tablet Take 1 tablet (10 mg total) by mouth at bedtime. 90 tablet 3  . Multiple Vitamins-Minerals (EYE VITAMINS PO) Take by mouth.    Marland Kitchen omeprazole (PRILOSEC) 20 MG capsule TAKE 1 CAPSULE BY MOUTH TWICE DAILY BEFORE MEAL(S) 180 capsule 1  . Tiotropium Bromide Monohydrate (SPIRIVA RESPIMAT) 2.5 MCG/ACT AERS Inhale 2 puffs into the lungs daily.    . vitamin A 8000 UNIT capsule Take 8,000 Units by mouth daily.    Alveda Reasons 20 MG TABS tablet TAKE 1 TABLET BY MOUTH ONCE DAILY WITH SUPPER 30 tablet 0   No current facility-administered medications for this visit.    PAST MEDICAL HISTORY: Past Medical History:  Diagnosis Date  . Allergy   . Arthritis   . Asthma   . Cataract   . Headache   . Stroke [facility])     PAST SURGICAL HISTORY: Past Surgical History:  Procedure Laterality Date  . BACK SURGERY  2005   lumbar  . EYE SURGERY     cataracts  . FRACTURE SURGERY Right   . SHOULDER ARTHROSCOPY Right   . SKIN GRAFT FULL THICKNESS TRUNK    . VASECTOMY      FAMILY HISTORY: Family History  Adopted: Yes  Problem Relation Age of Onset  . Colon cancer Mother   . Cirrhosis Father     SOCIAL HISTORY: Social History   Socioeconomic History  . Marital status: Married    Spouse name: Retired  . Number of children: 2  . Years of education: 37  . Highest education level: GED or equivalent  Occupational History  . Occupation: Retired  Tobacco Use  . Smoking status: Former Smoker    Types: Cigars  . Smokeless tobacco: Current User    Types: Snuff  Vaping Use  . Vaping Use: Never used  Substance and Sexual Activity  . Alcohol use: No  . Drug use: No  . Sexual activity: Yes  Other Topics Concern  . Not on file  Social History Narrative   Lives at home with his wife.   Right-handed.   12 cups coffee day.   Social  Determinants of Health   Financial Resource Strain: Not on file  Food Insecurity: Not on file  Transportation Needs: Not on file  Physical Activity: Not on file  Stress: Not on file  Social Connections: Not on file  Intimate Partner Violence: Not on file     PHYSICAL EXAM   Vitals:   12/09/20 0756  BP: 117/71  Pulse: 79  Weight: 255 lb 8 oz (115.9 kg)  Height: 5' 10"  (1.778 m)   Not recorded     Body mass index is 36.66 kg/m.  PHYSICAL EXAMNIATION:  Gen: NAD, conversant, well nourised, well groomed                     Cardiovascular: Regular rate rhythm, no peripheral edema, warm, nontender. Eyes: Conjunctivae clear without exudates or hemorrhage Neck: Supple, no carotid bruits. Pulmonary: Clear to auscultation bilaterally   NEUROLOGICAL EXAM:  MENTAL STATUS: Speech/cognition: Awake, alert, oriented  to history taking and casual conversation, Mild word finding and comprehensive difficulties.  CRANIAL NERVES: CN II: Visual fields are full to confrontation. Pupils are round equal and briskly reactive to light. CN III, IV, VI: extraocular movement are normal. No ptosis. CN V: Facial sensation is intact to light touch CN VII: Face is symmetric with normal eye closure  CN VIII: Hearing is normal to causal conversation. CN IX, X: Phonation is normal. CN XI: Head turning and shoulder shrug are intact  MOTOR: There is no pronator drift of out-stretched arms. Muscle bulk and tone are normal. Muscle strength is normal.  REFLEXES: Reflexes are 1 and symmetric at the biceps, triceps, knees, and ankles. Plantar responses are flexor.  SENSORY: Intact to light touch, pinprick and vibratory sensation are intact in fingers and toes.  COORDINATION: There is no trunk or limb dysmetria noted.  GAIT/STANCE: He needs to get up from seated position, steady, but limited because of his body habitus Romberg is absent.   DIAGNOSTIC DATA (LABS, IMAGING, TESTING) - I reviewed  patient records, labs, notes, testing and imaging myself where available.   ASSESSMENT AND PLAN  Infant Zink is a 75 y.o. 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 Ph.D.  [facility] [address] [place], [place] [address] Ph: [phone] Fax: [phone]  CC:  Dettinger, Fransisca Kaufmann, MD [address] [place],  [place] [address]

## 2020-12-10 LAB — C-REACTIVE PROTEIN: CRP: 7 mg/L (ref 0–10)

## 2020-12-10 LAB — RPR: RPR Ser Ql: NONREACTIVE

## 2020-12-10 LAB — VITAMIN B12: Vitamin B-12: 269 pg/mL (ref 232–1245)

## 2020-12-10 LAB — SEDIMENTATION RATE: Sed Rate: 35 mm/hr — ABNORMAL HIGH (ref 0–30)

## 2020-12-10 LAB — TSH: TSH: 4.32 u[IU]/mL (ref 0.450–4.500)

## 2020-12-14 ENCOUNTER — Telehealth: Payer: Self-pay | Admitting: Neurology

## 2020-12-14 NOTE — Telephone Encounter (Signed)
Please call patient, laboratory evaluation showed normal C-reactive protein, only slightly elevated ESR, has no clinical significance  Low normal range B12, 269, he would benefit over-the-counter B12 supplement, 1000 mcg 1 tablet daily  Rest of the laboratory evaluation showed no significant abnormalities.

## 2020-12-14 NOTE — Telephone Encounter (Signed)
I spoke to the patient's wife, Craig Hurley. She verbalized understanding of the lab results and will start him on the recommended B12 supplement.  She would like a call back to get his MRI and MRA scheduled. She can be reached at [phone]. She has updated this to be the primary contact number.

## 2020-12-16 ENCOUNTER — Telehealth: Payer: Medicare HMO | Admitting: Family

## 2020-12-16 DIAGNOSIS — J439 Emphysema, unspecified: Secondary | ICD-10-CM | POA: Diagnosis not present

## 2020-12-16 DIAGNOSIS — J441 Chronic obstructive pulmonary disease with (acute) exacerbation: Secondary | ICD-10-CM | POA: Diagnosis not present

## 2020-12-16 MED ORDER — BENZONATATE 100 MG PO CAPS: 100.0000 mg | ORAL_CAPSULE | Freq: Three times a day (TID) | ORAL | 0 refills | Status: DC | PRN

## 2020-12-16 MED ORDER — DOXYCYCLINE HYCLATE 100 MG PO TABS: 100.0000 mg | ORAL_TABLET | Freq: Two times a day (BID) | ORAL | 0 refills | Status: AC

## 2020-12-16 MED ORDER — ALBUTEROL SULFATE HFA 108 (90 BASE) MCG/ACT IN AERS: 2.0000 | INHALATION_SPRAY | RESPIRATORY_TRACT | 0 refills | Status: DC | PRN

## 2020-12-16 MED ORDER — PREDNISONE 20 MG PO TABS: 20.0000 mg | ORAL_TABLET | Freq: Every day | ORAL | 0 refills | Status: DC

## 2020-12-16 NOTE — Progress Notes (Signed)
We are sorry that you are not feeling well.  Here is how we plan to help!  Based on your presentation I believe you most likely have A cough due to bacteria.  When patients have a fever and a productive cough with a change in color or increased sputum production, we are concerned about bacterial bronchitis.  If left untreated it can progress to pneumonia.  If your symptoms do not improve with your treatment plan it is important that you contact your provider.   I have prescribed Doxycycline 100 mg twice a day for 7 days     In addition you may use A prescription cough medication called Tessalon Perles 100mg . You may take 1-2 capsules every 8 hours as needed for your cough.  I am also calling in prednisone 20 mg. Please take 1 tablet daily for the next 5 days.  Please follow up with your PCP if the symptoms are not improving by early next week.   From your responses in the eVisit questionnaire you describe inflammation in the upper respiratory tract which is causing a significant cough.  This is commonly called Bronchitis and has four common causes:    Allergies  Viral Infections  Acid Reflux  Bacterial Infection Allergies, viruses and acid reflux are treated by controlling symptoms or eliminating the cause. An example might be a cough caused by taking certain blood pressure medications. You stop the cough by changing the medication. Another example might be a cough caused by acid reflux. Controlling the reflux helps control the cough.  USE OF BRONCHODILATOR ("RESCUE") INHALERS: There is a risk from using your bronchodilator too frequently.  The risk is that over-reliance on a medication which only relaxes the muscles surrounding the breathing tubes can reduce the effectiveness of medications prescribed to reduce swelling and congestion of the tubes themselves.  Although you feel brief relief from the bronchodilator inhaler, your asthma may actually be worsening with the tubes becoming more  swollen and filled with mucus.  This can delay other crucial treatments, such as oral steroid medications. If you need to use a bronchodilator inhaler daily, several times per day, you should discuss this with your provider.  There are probably better treatments that could be used to keep your asthma under control.     HOME CARE . Only take medications as instructed by your medical team. . Complete the entire course of an antibiotic. . Drink plenty of fluids and get plenty of rest. . Avoid close contacts especially the very young and the elderly . Cover your mouth if you cough or cough into your sleeve. . Always remember to wash your hands . A steam or ultrasonic humidifier can help congestion.   GET HELP RIGHT AWAY IF: . You develop worsening fever. . You become short of breath . You cough up blood. . Your symptoms persist after you have completed your treatment plan MAKE SURE YOU   Understand these instructions.  Will watch your condition.  Will get help right away if you are not doing well or get worse.  Your e-visit answers were reviewed by a board certified advanced clinical practitioner to complete your personal care plan.  Depending on the condition, your plan could have included both over the counter or prescription medications. If there is a problem please reply  once you have received a response from your provider. Your safety is important to [place].  If you have drug allergies check your prescription carefully.    You can use  MyChart to ask questions about today's visit, request a non-urgent call back, or ask for a work or school excuse for 24 hours related to this e-Visit. If it has been greater than 24 hours you will need to follow up with your provider, or enter a new e-Visit to address those concerns. You will get an e-mail in the next two days asking about your experience.  I hope that your e-visit has been valuable and will speed your recovery. Thank you for using  e-visits.  Greater than 5 minutes, yet less than 10 minutes of time have been spent researching, coordinating, and implementing care for this patient.

## 2020-12-16 NOTE — Addendum Note (Signed)
Addended by: Sherlene Shams on: 12/16/2020 09:13 AM   Modules accepted: Orders

## 2020-12-17 ENCOUNTER — Ambulatory Visit
Admission: RE | Admit: 2020-12-17 | Discharge: 2020-12-17 | Disposition: A | Discharge status: Home / Self Care | Payer: Medicare Other | Source: Ambulatory Visit | Attending: Neurology | Admitting: Neurology

## 2020-12-17 ENCOUNTER — Telehealth: Payer: Self-pay | Admitting: Pharmacist

## 2020-12-17 ENCOUNTER — Ambulatory Visit
Admission: RE | Admit: 2020-12-17 | Discharge: 2020-12-17 | Disposition: A | Discharge status: Home / Self Care | Payer: Medicare HMO | Source: Ambulatory Visit | Attending: Neurology | Admitting: Neurology

## 2020-12-17 ENCOUNTER — Other Ambulatory Visit: Payer: Self-pay

## 2020-12-17 ENCOUNTER — Encounter: Payer: Self-pay | Admitting: Family Medicine

## 2020-12-17 DIAGNOSIS — I63312 Cerebral infarction due to thrombosis of left middle cerebral artery: Secondary | ICD-10-CM

## 2020-12-17 DIAGNOSIS — I639 Cerebral infarction, unspecified: Secondary | ICD-10-CM | POA: Diagnosis not present

## 2020-12-17 DIAGNOSIS — R4701 Aphasia: Secondary | ICD-10-CM

## 2020-12-17 DIAGNOSIS — R413 Other amnesia: Secondary | ICD-10-CM

## 2020-12-17 DIAGNOSIS — I6522 Occlusion and stenosis of left carotid artery: Secondary | ICD-10-CM

## 2020-12-17 IMAGING — MR MR HEAD W/O CM
12 series · 48 of 48 positions shown · IV contrast (multihance)
Comparison: Head CT [DATE].

CLINICAL DATA: Stroke follow-up. Left-sided aphasia and memory
loss.

EXAM:
MRI HEAD WITHOUT CONTRAST
MRA NECK WITHOUT AND WITH CONTRAST
TECHNIQUE: Multiplanar, multiecho pulse sequences of the brain and surrounding
structures were obtained without intravenous contrast. Angiographic
images of the neck were obtained using MRA technique with and
without intravenous contrast. Carotid stenosis measurements (when
applicable) are obtained utilizing NASCET criteria, using the distal
internal carotid diameter as the denominator.
CONTRAST:  20mL MULTIHANCE GADOBENATE DIMEGLUMINE 529 MG/ML IV SOLN

[Series 2: T1 · sagittal · 5.0mm · 0.45mm/px · 1 of 21 slices shown]
[im 1/21]
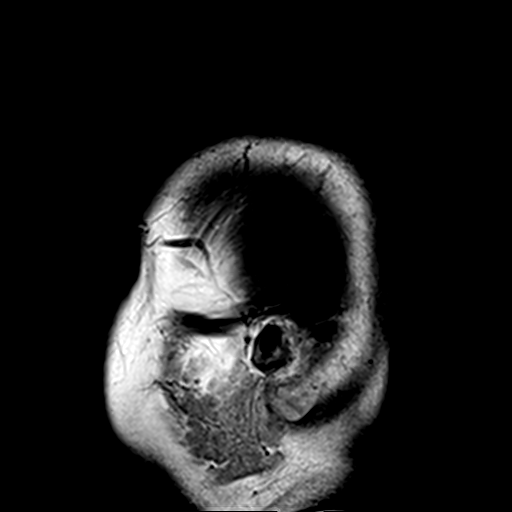

[Series 3: DWI · axial · 3.0mm · 1.80mm/px · z∈[-33,+108]mm · 6 of 99 slices shown (1 of 4)]
[im 1/99]
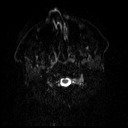
[im 20/99]
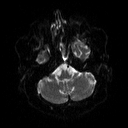
[im 40/99]
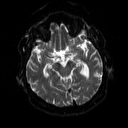
[im 59/99]
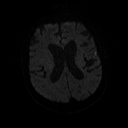
[im 79/99]
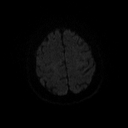
[im 99/99]
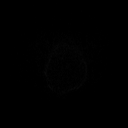

[Series 4: DWI · axial · 3.0mm · 1.80mm/px · z∈[-33,+108]mm · 4 of 49 slices shown (2 of 4)]
[im 1/49]
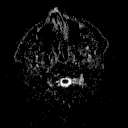
[im 17/49]
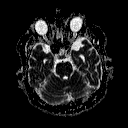
[im 33/49]
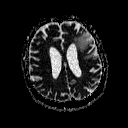
[im 49/49]
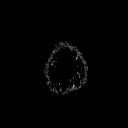

[Series 5: DWI · coronal · 5.0mm · 1.80mm/px · 5 of 68 slices shown (3 of 4)]
[im 1/68]
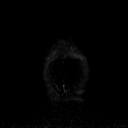
[im 17/68]
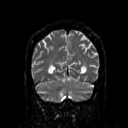
[im 34/68]
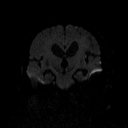
[im 51/68]
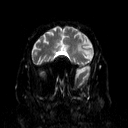
[im 68/68]
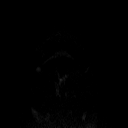

[Series 6: DWI · coronal · 5.0mm · 1.80mm/px · 2 of 34 slices shown (4 of 4)]
[im 1/34]
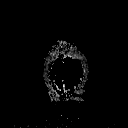
[im 34/34]
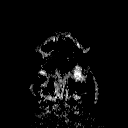

[Series 7: T2 · axial · 5.0mm · 0.51mm/px · z∈[-43,+101]mm · 2 of 22 slices shown (1 of 2)]
[im 1/22]
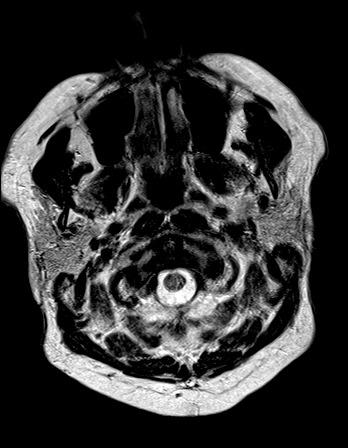
[im 22/22]
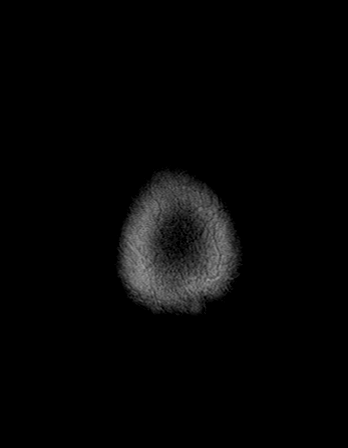

[Series 8: FLAIR · axial · 3.0mm · 0.45mm/px · z∈[-37,+95]mm · 2 of 30 slices shown]
[im 1/30]
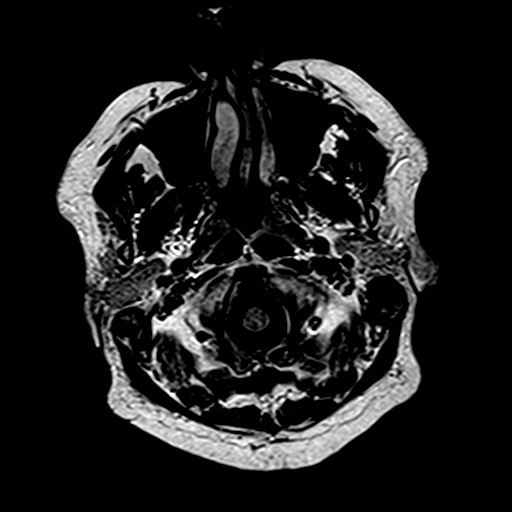
[im 30/30]
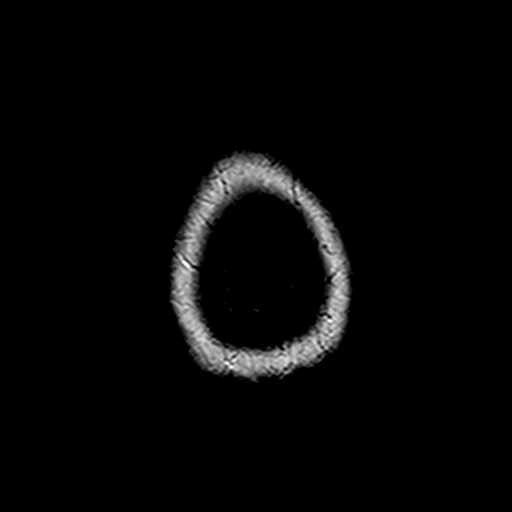

[Series 10: swi_images · axial · 4.0mm · 0.90mm/px · z∈[-40,+97]mm · 3 of 36 slices shown]
[im 1/36]
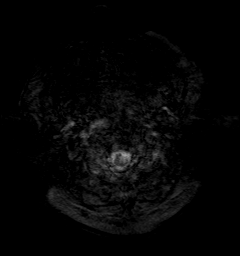
[im 18/36]
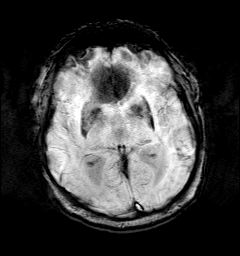
[im 36/36]
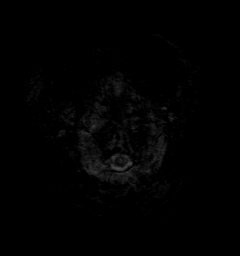

[Series 11: t1_mpr_tra · axial · 1.0mm · 0.71mm/px · z∈[-41,+99]mm · 11 of 144 slices shown]
[im 1/144]
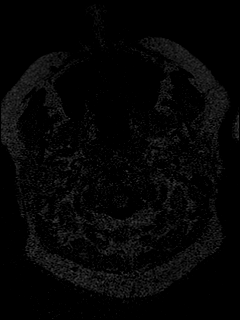
[im 15/144]
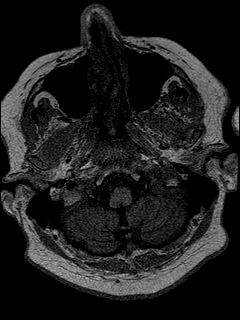
[im 29/144]
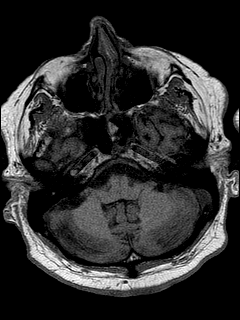
[im 43/144]
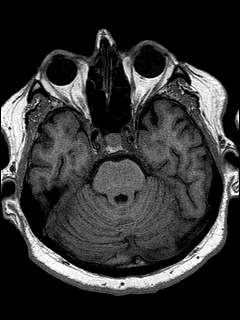
[im 58/144]
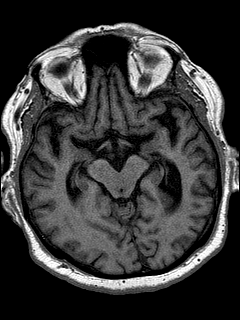
[im 72/144]
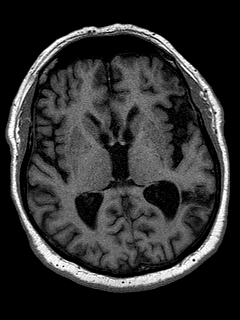
[im 86/144]
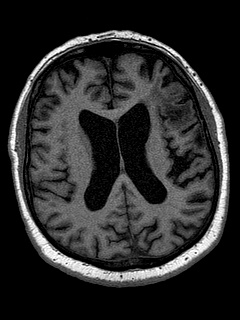
[im 101/144]
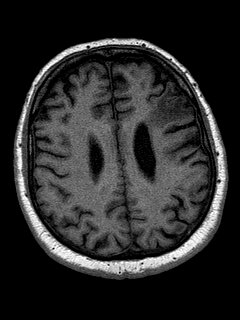
[im 115/144]
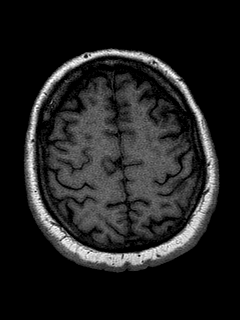
[im 129/144]
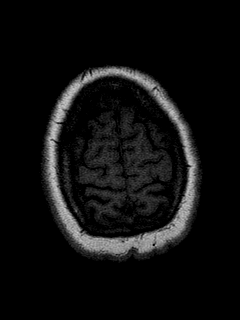
[im 144/144]
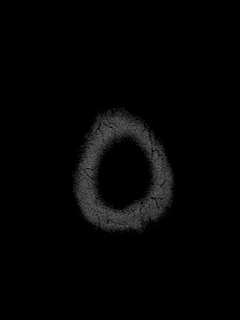

[Series 12: T2 · coronal · 5.0mm · 0.45mm/px · 2 of 25 slices shown (2 of 2)]
[im 1/25]
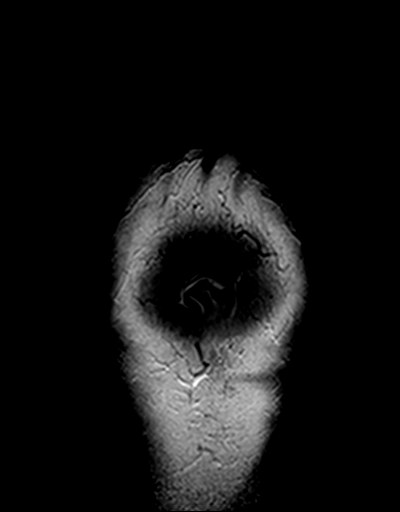
[im 25/25]
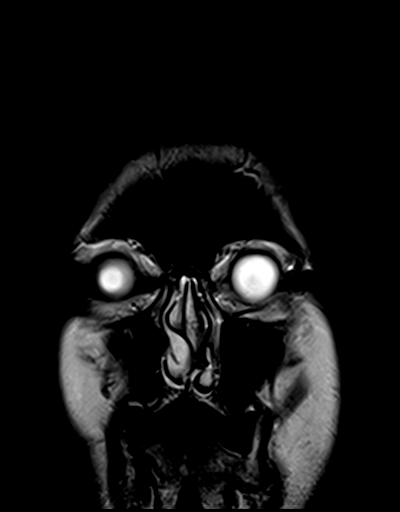

[Series 16: fl_tof_2d · axial · 3.0mm · 0.39mm/px · z∈[-180,-82]mm · 4 of 50 slices shown]
[im 1/50]
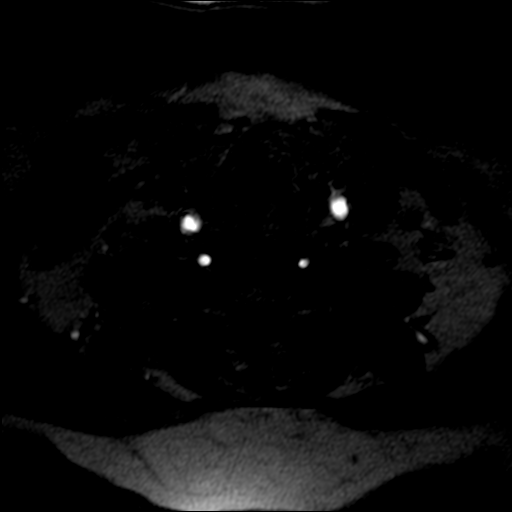
[im 17/50]
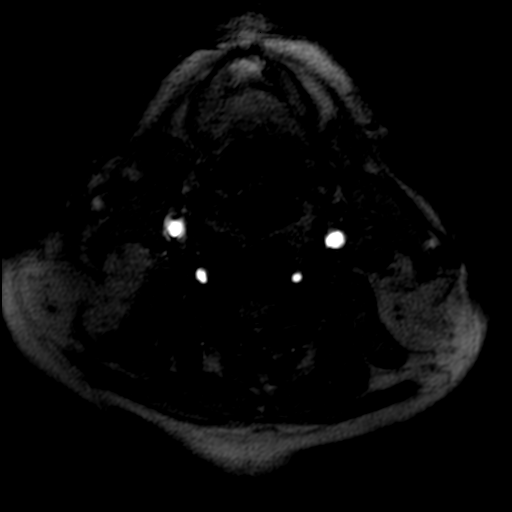
[im 33/50]
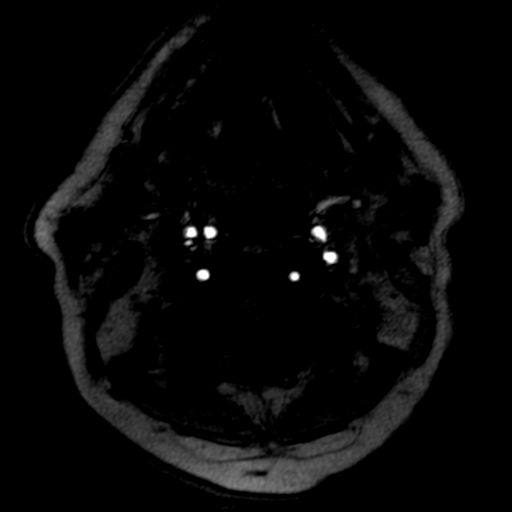
[im 50/50]
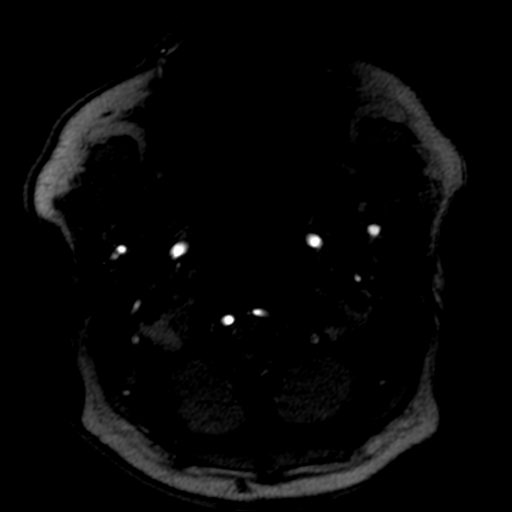

[Series 22: (id)_tt=1.0s · coronal · 0.8mm · 0.78mm/px · 6 of 79 slices shown]
[im 1/79]
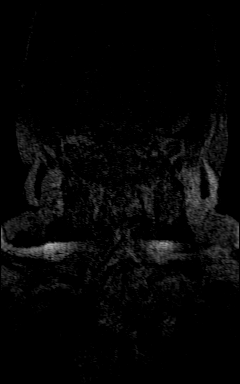
[im 16/79]
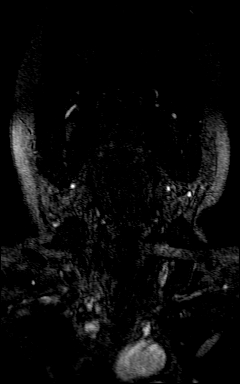
[im 32/79]
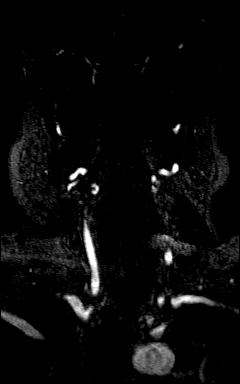
[im 47/79]
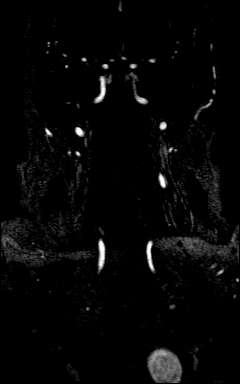
[im 63/79]
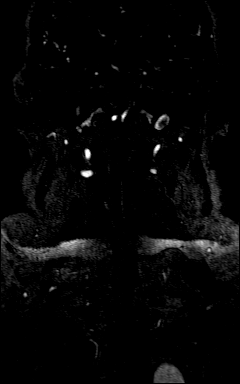
[im 79/79]
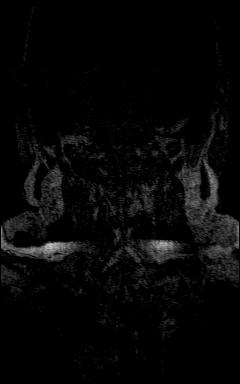

[48 of 48 positions shown; findings below may reference images not displayed]

FINDINGS: MRI HEAD FINDINGS

Brain: Punctate focus of restricted diffusion in the left frontal
operculum (series 3, image 81) adjacent to area of encephalomalacia
and gliosis. Other areas of encephalomalacia and gliosis are seen in
the left frontoparietal and posterior temporal regions.

No hemorrhage, hydrocephalus, extra-axial collection or mass lesion.

Vascular: Normal flow voids.

Skull and upper cervical spine: Normal marrow signal.

Sinuses/Orbits: Mucosal thickening with secretion within the left
sphenoid sinus. The orbits are maintained.

MRA NECK FINDINGS

Aortic arch: Normal 3 vessel aortic branching pattern. The
visualized subclavian arteries are normal.

Right carotid system: Normal course and caliber without stenosis or
evidence of dissection.

Left carotid system: Normal course and caliber without stenosis or
evidence of dissection.

Vertebral arteries: Right dominant. Vertebral artery origins are
normal. Vertebral arteries are normal in course and caliber to the
vertebrobasilar confluence without stenosis or evidence of
dissection.
IMPRESSION: 1. Punctate focus of restricted diffusion in the left frontal
operculum adjacent to area of encephalomalacia and gliosis
consistent with tiny acute infarct. Other areas of encephalomalacia
and gliosis are seen in the left frontoparietal and posterior
temporal regions.
2. Normal MRA of the neck without evidence of stenosis or
dissection.

## 2020-12-17 MED ORDER — GADOBENATE DIMEGLUMINE 529 MG/ML IV SOLN
20.0000 mL | Freq: Once | INTRAVENOUS | Status: AC | PRN
  Administered 2020-12-17: 20 mL via INTRAVENOUS

## 2020-12-17 MED ORDER — BUDESONIDE-FORMOTEROL FUMARATE 160-4.5 MCG/ACT IN AERO: 2.0000 | INHALATION_SPRAY | Freq: Two times a day (BID) | RESPIRATORY_TRACT | 11 refills | Status: DC

## 2020-12-17 NOTE — Telephone Encounter (Signed)
Patient re-[place] patient [place] for Symbicort for 4801 Application filled out online Medication will ship to patient's home RX escribed to [facility] with 3 refills

## 2020-12-19 NOTE — Telephone Encounter (Signed)
Application faxed for [place] patient assistance program for Spiriva 2022 Medication to ship to patient's home Patient can call [phone] to check on status

## 2020-12-20 ENCOUNTER — Telehealth: Payer: Self-pay | Admitting: Neurology

## 2020-12-20 ENCOUNTER — Encounter: Payer: Self-pay | Admitting: *Deleted

## 2020-12-20 NOTE — Telephone Encounter (Signed)
I spoke to the patient's wife on DPR. She verbalized understanding of results and to continue the treatment plan discussed at his last office visit.

## 2020-12-20 NOTE — Telephone Encounter (Signed)
  IMPRESSION: 1. Punctate focus of restricted diffusion in the left frontal operculum adjacent to area of encephalomalacia and gliosis consistent with tiny acute infarct. Other areas of encephalomalacia and gliosis are seen in the left frontoparietal and posterior temporal regions. 2. Normal MRA of the neck without evidence of stenosis or dissection.   Please call patient, evidence of left frontal/parietal, posterior temporal area stroke, tiny acute infarction at the left frontal region,  MRA of the neck showed no significant large vessel disease  Keep current medications

## 2020-12-24 ENCOUNTER — Other Ambulatory Visit: Payer: Self-pay | Admitting: *Deleted

## 2020-12-24 MED ORDER — DOXAZOSIN MESYLATE 4 MG PO TABS: 4.0000 mg | ORAL_TABLET | Freq: Every day | ORAL | 2 refills | Status: DC

## 2020-12-24 MED ORDER — MELOXICAM 15 MG PO TABS: 15.0000 mg | ORAL_TABLET | Freq: Every day | ORAL | 2 refills | Status: DC

## 2021-01-20 ENCOUNTER — Encounter: Payer: Self-pay | Admitting: Family Medicine

## 2021-01-24 ENCOUNTER — Ambulatory Visit (INDEPENDENT_AMBULATORY_CARE_PROVIDER_SITE_OTHER): Payer: Medicare HMO | Admitting: Family

## 2021-01-24 ENCOUNTER — Encounter: Payer: Self-pay | Admitting: Family

## 2021-01-24 DIAGNOSIS — Z20822 Contact with and (suspected) exposure to covid-19: Secondary | ICD-10-CM | POA: Diagnosis not present

## 2021-01-24 NOTE — Progress Notes (Signed)
   Virtual Visit via telephone Note Due to COVID-19 pandemic this visit was conducted virtually. This visit type was conducted due to national recommendations for restrictions regarding the COVID-19 Pandemic (e.g. social distancing, sheltering in place) in an effort to limit this patient's exposure and mitigate transmission in our community. All issues noted in this document were discussed and addressed.  A physical exam was not performed with this format.  I connected with Craig Hurley on 01/24/21 at 9:51 AM  by telephone and verified that I am speaking with the correct person using two identifiers. Craig Hurley is currently located at home and wife is currently with him during visit. The provider, Evelina Dun, FNP is located in their office at time of visit.  I discussed the limitations, risks, security and privacy concerns of performing an evaluation and management service by telephone and the availability of in person appointments. I also discussed with the patient that there may be a patient responsible charge related to this service. The patient expressed understanding and agreed to proceed.   History and Present Illness:  HPI Pt calls the office today wanting to be tested for COVID. He  reports his granddaughter who lives with him is positive for COVID. Her granddaughter symptoms started 01/20/21. He denies any symptoms at this time of fever, cough, or SOB.  He does have COPD and has a chronic cough.    Review of Systems  All other systems reviewed and are negative.    Observations/Objective: No SOB or distress noted   Assessment and Plan: 1. Exposure to COVID-19 virus Pt will come for COVID testing to rule out Rest Report any worsening symptoms  - Novel Coronavirus, NAA ([facility])     I discussed the assessment and treatment plan with the patient. The patient was provided an opportunity to ask questions and all were answered. The patient agreed with the plan and  demonstrated an understanding of the instructions.   The patient was advised to call back or seek an in-person evaluation if the symptoms worsen or if the condition fails to improve as anticipated.  The above assessment and management plan was discussed with the patient. The patient verbalized understanding of and has agreed to the management plan. Patient is aware to call the clinic if symptoms persist or worsen. Patient is aware when to return to the clinic for a follow-up visit. Patient educated on when it is appropriate to go to the emergency department.   Time call ended:  10:02 AM   I provided 11 minutes of non-face-to-face time during this encounter.    Evelina Dun, FNP

## 2021-01-25 LAB — SARS-COV-2, NAA 2 DAY TAT

## 2021-01-25 LAB — NOVEL CORONAVIRUS, NAA: SARS-CoV-2, NAA: NOT DETECTED

## 2021-02-10 ENCOUNTER — Ambulatory Visit: Payer: Medicare HMO | Admitting: Neurology

## 2021-02-10 ENCOUNTER — Telehealth: Payer: Self-pay | Admitting: Neurology

## 2021-02-10 ENCOUNTER — Encounter: Payer: Self-pay | Admitting: Neurology

## 2021-02-10 VITALS — BP 118/75 | HR 80 | Ht 70.0 in | Wt 258.0 lb

## 2021-02-10 DIAGNOSIS — R4701 Aphasia: Secondary | ICD-10-CM | POA: Diagnosis not present

## 2021-02-10 DIAGNOSIS — I63312 Cerebral infarction due to thrombosis of left middle cerebral artery: Secondary | ICD-10-CM | POA: Diagnosis not present

## 2021-02-10 DIAGNOSIS — R002 Palpitations: Secondary | ICD-10-CM
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
## 2021-02-10 NOTE — Telephone Encounter (Signed)
[facility] 30 days cardiac monitoring

## 2021-02-15 ENCOUNTER — Other Ambulatory Visit: Payer: Self-pay | Admitting: *Deleted

## 2021-02-15 DIAGNOSIS — R4701 Aphasia: Secondary | ICD-10-CM

## 2021-02-15 DIAGNOSIS — I639 Cerebral infarction, unspecified: Secondary | ICD-10-CM

## 2021-02-15 DIAGNOSIS — I4891 Unspecified atrial fibrillation: Secondary | ICD-10-CM

## 2021-02-15 DIAGNOSIS — I63312 Cerebral infarction due to thrombosis of left middle cerebral artery: Secondary | ICD-10-CM

## 2021-02-15 NOTE — Telephone Encounter (Signed)
Craig Hurley Updated For [place] and Mailed Monitor.
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
## 2021-02-19 DIAGNOSIS — R002 Palpitations: Secondary | ICD-10-CM | POA: Insufficient documentation

## 2021-02-19 NOTE — Telephone Encounter (Signed)
Orders Placed This Encounter  Procedures  . Ambulatory referral to Cardiology

## 2021-02-19 NOTE — Addendum Note (Signed)
Addended by: Marcial Pacas on: 02/19/2021 09:26 PM   Modules accepted: Orders

## 2021-02-21 NOTE — Telephone Encounter (Signed)
I spoke to the patient's wife who is agreeable to the cardiology referral. They will hold off on placing the monitor for now.  They prefer to see a cardiologist near [facility], if possible, because it is closer to their home.  She would like a call back when the referral has been made.

## 2021-02-22 NOTE — Telephone Encounter (Signed)
Patient is scheduled at [facility] 03/01/2021 arrive at 9:15 am  With Dr. Beatrix Fetters  .  [place]  Patient wife is aware apt and wanted soonest apt I could get for her husband . Thanks Hinton Dyer
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
## 2021-03-01 ENCOUNTER — Ambulatory Visit: Payer: Medicare HMO | Admitting: Cardiology

## 2021-03-01 ENCOUNTER — Encounter: Payer: Self-pay | Admitting: Cardiology

## 2021-03-01 ENCOUNTER — Other Ambulatory Visit: Payer: Self-pay

## 2021-03-01 VITALS — BP 122/78 | HR 70 | Ht 70.0 in | Wt 256.0 lb

## 2021-03-01 DIAGNOSIS — R079 Chest pain, unspecified: Secondary | ICD-10-CM

## 2021-03-01 DIAGNOSIS — I10 Essential (primary) hypertension: Secondary | ICD-10-CM

## 2021-03-01 DIAGNOSIS — I63312 Cerebral infarction due to thrombosis of left middle cerebral artery: Secondary | ICD-10-CM | POA: Diagnosis not present

## 2021-03-01 DIAGNOSIS — I5021 Acute systolic (congestive) heart failure: Secondary | ICD-10-CM | POA: Diagnosis not present

## 2021-03-01 DIAGNOSIS — E785 Hyperlipidemia, unspecified: Secondary | ICD-10-CM

## 2021-03-01 NOTE — Patient Instructions (Signed)
Medication Instructions:  Your physician recommends that you continue on your current medications as directed. Please refer to the Current Medication list given to you today.  *If you need a refill on your cardiac medications before your next appointment, please call your pharmacy*  Testing/Procedures: Your physician has requested that you have an echocardiogram. Echocardiography is a painless test that uses sound waves to create images of your heart. It provides your doctor with information about the size and shape of your heart and how well your heart's chambers and valves are working. This procedure takes approximately one hour. There are no restrictions for this procedure.  Your physician has requested that you have a lexiscan myoview at [facility] (same day as echo if possible). For further information please visit [email or web address]. Please follow instruction sheet, as given.   How to prepare for your Myocardial Perfusion Test:  Do not eat or drink 3 hours prior to your test, except you may have water.  Do not consume products containing caffeine (regular or decaffeinated) 12 hours prior to your test. (ex: coffee, chocolate, sodas, tea).  Do bring a list of your current medications with you.  If not listed below, you may take your medications as normal.  Do wear comfortable clothes (no dresses or overalls) and walking shoes, tennis shoes preferred (No heels or open toe shoes are allowed).  Do NOT wear cologne, perfume, aftershave, or lotions (deodorant is allowed).  The test will take approximately 3 to 4 hours to complete  If these instructions are not followed, your test will have to be rescheduled.  Loop implant by Dr. Colin Benton instructions provided  Follow-Up: At [facility], you and your health needs are our priority.  As part of our continuing mission to provide you with exceptional heart care, we have created designated Provider Care Teams.  These Care Teams  include your primary Cardiologist (physician) and Advanced Practice Providers (APPs -  Physician Assistants and Nurse Practitioners) who all work together to provide you with the care you need, when you need it.  We recommend signing up for the patient portal called "MyChart".  Sign up information is provided on this After Visit Summary.  MyChart is used to connect with patients for Virtual Visits (Telemedicine).  Patients are able to view lab/test results, encounter notes, upcoming appointments, etc.  Non-urgent messages can be sent to your provider as well.   To learn more about what you can do with MyChart, go to [email or web address].    Your next appointment:   3 month(s)  The format for your next appointment:   In Person  Provider:   Oswaldo Milian, MD

## 2021-03-20 ENCOUNTER — Other Ambulatory Visit: Payer: Self-pay | Admitting: Family Medicine

## 2021-03-25 ENCOUNTER — Telehealth: Payer: Self-pay | Admitting: *Deleted

## 2021-03-25 NOTE — Telephone Encounter (Signed)
Spoke to wife-she states she called [employer] and was informed if MD called to do peer to peer they would have a decision within 2 hours.   Advised would clarify with precert and return call.   Precert contacting insurance to verify and return call to wife to discuss.

## 2021-03-25 NOTE — Telephone Encounter (Signed)
[employer] is calling requesting to speak with Hayley again in regards to their previous conversation. Please advise.

## 2021-03-25 NOTE — Telephone Encounter (Signed)
Authorization pending for loop implant scheduled Monday 5/2 with Dr. Sallyanne Kuster.      Called patient, spoke to wife (ok per DPR)-aware appt cancelled until authorization received.  Will call back to reschedule.

## 2021-03-28 ENCOUNTER — Ambulatory Visit: Payer: Medicare HMO | Admitting: Cardiovascular Disease

## 2021-04-04 ENCOUNTER — Ambulatory Visit: Payer: Medicare HMO | Admitting: Cardiology

## 2021-04-06 ENCOUNTER — Telehealth: Payer: Self-pay

## 2021-04-06 NOTE — Telephone Encounter (Signed)
Spoke with the patient's wife. Detailed instructions given. She stated that she understood and would give the patient his instructions. Asked to call back with any questions. Bebe Liter EMTP

## 2021-04-12 ENCOUNTER — Other Ambulatory Visit: Payer: Self-pay

## 2021-04-12 ENCOUNTER — Ambulatory Visit (HOSPITAL_COMMUNITY): Payer: Medicare HMO | Attending: Cardiology

## 2021-04-12 ENCOUNTER — Ambulatory Visit (HOSPITAL_BASED_OUTPATIENT_CLINIC_OR_DEPARTMENT_OTHER): Payer: Medicare HMO

## 2021-04-12 DIAGNOSIS — R079 Chest pain, unspecified: Secondary | ICD-10-CM | POA: Diagnosis not present

## 2021-04-12 DIAGNOSIS — I5021 Acute systolic (congestive) heart failure: Secondary | ICD-10-CM

## 2021-04-12 LAB — MYOCARDIAL PERFUSION IMAGING
LV dias vol: 159 mL (ref 62–150)
LV sys vol: 112 mL
Peak HR: 96 {beats}/min
Rest HR: 55 {beats}/min
SDS: 5
SRS: 1
SSS: 6
TID: 1.21

## 2021-04-12 LAB — ECHOCARDIOGRAM COMPLETE
Area-P 1/2: 2.48 cm2
S' Lateral: 3.7 cm

## 2021-04-12 MED ORDER — TECHNETIUM TC 99M TETROFOSMIN IV KIT
31.6000 | PACK | Freq: Once | INTRAVENOUS | Status: AC | PRN
  Administered 2021-04-12: 31.6 via INTRAVENOUS
  Filled 2021-04-12: qty 32

## 2021-04-12 MED ORDER — REGADENOSON 0.4 MG/5ML IV SOLN
0.4000 mg | Freq: Once | INTRAVENOUS | Status: AC
  Administered 2021-04-12: 0.4 mg via INTRAVENOUS

## 2021-04-12 MED ORDER — TECHNETIUM TC 99M TETROFOSMIN IV KIT
10.9000 | PACK | Freq: Once | INTRAVENOUS | Status: AC | PRN
Start: 2021-04-12 — End: 2021-04-12
  Administered 2021-04-12: 10.9 via INTRAVENOUS
  Filled 2021-04-12: qty 11
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
## 2021-04-27 ENCOUNTER — Other Ambulatory Visit: Payer: Self-pay

## 2021-04-27 ENCOUNTER — Ambulatory Visit (INDEPENDENT_AMBULATORY_CARE_PROVIDER_SITE_OTHER): Payer: Medicare HMO | Admitting: Cardiology

## 2021-04-27 ENCOUNTER — Telehealth: Payer: Self-pay | Admitting: Cardiology

## 2021-04-27 VITALS — BP 134/78 | HR 61 | Ht 70.0 in | Wt 255.8 lb

## 2021-04-27 DIAGNOSIS — R079 Chest pain, unspecified: Secondary | ICD-10-CM

## 2021-04-27 DIAGNOSIS — I639 Cerebral infarction, unspecified: Secondary | ICD-10-CM | POA: Diagnosis not present

## 2021-04-27 DIAGNOSIS — I5042 Chronic combined systolic (congestive) and diastolic (congestive) heart failure: Secondary | ICD-10-CM

## 2021-04-27 DIAGNOSIS — I10 Essential (primary) hypertension: Secondary | ICD-10-CM | POA: Diagnosis not present

## 2021-04-27 DIAGNOSIS — I4891 Unspecified atrial fibrillation: Secondary | ICD-10-CM | POA: Diagnosis not present

## 2021-04-27 DIAGNOSIS — I429 Cardiomyopathy, unspecified: Secondary | ICD-10-CM | POA: Diagnosis not present

## 2021-04-27 MED ORDER — LOSARTAN POTASSIUM 25 MG PO TABS: 12.5000 mg | ORAL_TABLET | Freq: Every day | ORAL | 3 refills | Status: DC

## 2021-04-27 MED ORDER — CARVEDILOL 3.125 MG PO TABS: 3.1250 mg | ORAL_TABLET | Freq: Two times a day (BID) | ORAL | 3 refills | Status: DC

## 2021-04-27 MED ORDER — LOSARTAN POTASSIUM 25 MG PO TABS: 25.0000 mg | ORAL_TABLET | Freq: Every day | ORAL | 3 refills | Status: DC

## 2021-04-27 NOTE — Patient Instructions (Signed)
Medication Instructions:  START Losartan 12.5 mg (1/2 tablet) daily START carvedilol (Coreg) 3.125 mg two times daily  *If you need a refill on your cardiac medications before your next appointment, please call your pharmacy*   Lab Work: Please return for labs in 1-2 weeks(BMET, CBC)  Our in office lab hours are Monday-Friday 8:00-4:00, closed for lunch 12:45-1:45 pm.  No appointment needed.  Testing/Procedures: Your physician has requested that you have a cardiac MRI. Cardiac MRI uses a computer to create images of your heart as its beating, producing both still and moving pictures of your heart and major blood vessels. For further information please visit [email or web address] Please follow the instruction sheet given to you today for more information.  Follow-Up: At [facility], you and your health needs are our priority.  As part of our continuing mission to provide you with exceptional heart care, we have created designated Provider Care Teams.  These Care Teams include your primary Cardiologist (physician) and Advanced Practice Providers (APPs -  Physician Assistants and Nurse Practitioners) who all work together to provide you with the care you need, when you need it.  We recommend signing up for the patient portal called "MyChart".  Sign up information is provided on this After Visit Summary.  MyChart is used to connect with patients for Virtual Visits (Telemedicine).  Patients are able to view lab/test results, encounter notes, upcoming appointments, etc.  Non-urgent messages can be sent to your provider as well.   To learn more about what you can do with MyChart, go to [email or web address].    Your next appointment:   As scheduled with Dr. Gardiner Rhyme
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
## 2021-04-27 NOTE — Telephone Encounter (Signed)
Spoke with patient regarding scheduling preferences for the Cardiac MRI ordered by Dr. Alvin Critchley patient as soon as we hear regarding the prior authorization, I will be in touch with the appointment information.  Patient voiced his understanding.

## 2021-05-02 ENCOUNTER — Encounter: Payer: Self-pay | Admitting: Cardiology

## 2021-05-02 NOTE — Telephone Encounter (Signed)
Patient aware of the Thursday 05/26/21 12:00 pm Cardiac MRI appointment at [facility]---arrival time is 11:30 am at the 1st floor admissions office for check in.  Will mail information to patient and it is also available in My Chart.

## 2021-05-03 ENCOUNTER — Other Ambulatory Visit: Payer: Self-pay | Admitting: *Deleted

## 2021-05-03 DIAGNOSIS — J439 Emphysema, unspecified: Secondary | ICD-10-CM

## 2021-05-03 MED ORDER — ATORVASTATIN CALCIUM 10 MG PO TABS: 10.0000 mg | ORAL_TABLET | Freq: Every day | ORAL | 1 refills | Status: DC

## 2021-05-03 MED ORDER — MONTELUKAST SODIUM 10 MG PO TABS: 10.0000 mg | ORAL_TABLET | Freq: Every day | ORAL | 1 refills | Status: DC

## 2021-05-05 ENCOUNTER — Other Ambulatory Visit: Payer: Self-pay

## 2021-05-05 ENCOUNTER — Encounter: Payer: Self-pay | Admitting: Family Medicine

## 2021-05-05 ENCOUNTER — Ambulatory Visit (INDEPENDENT_AMBULATORY_CARE_PROVIDER_SITE_OTHER): Payer: Medicare HMO | Admitting: Family Medicine

## 2021-05-05 VITALS — BP 115/77 | HR 63 | Ht 70.0 in | Wt 255.0 lb

## 2021-05-05 DIAGNOSIS — R7303 Prediabetes: Secondary | ICD-10-CM

## 2021-05-05 DIAGNOSIS — J439 Emphysema, unspecified: Secondary | ICD-10-CM

## 2021-05-05 DIAGNOSIS — K219 Gastro-esophageal reflux disease without esophagitis: Secondary | ICD-10-CM | POA: Diagnosis not present

## 2021-05-05 DIAGNOSIS — Z136 Encounter for screening for cardiovascular disorders: Secondary | ICD-10-CM | POA: Diagnosis not present

## 2021-05-05 DIAGNOSIS — Z23 Encounter for immunization: Secondary | ICD-10-CM | POA: Diagnosis not present

## 2021-05-05 LAB — BAYER DCA HB A1C WAIVED: HB A1C (BAYER DCA - WAIVED): 5.8 % (ref <7.0)

## 2021-05-05 MED ORDER — OMEPRAZOLE 20 MG PO CPDR: 20.0000 mg | DELAYED_RELEASE_CAPSULE | Freq: Two times a day (BID) | ORAL | 1 refills | Status: DC

## 2021-05-05 MED ORDER — MELOXICAM 15 MG PO TABS: 1.0000 | ORAL_TABLET | Freq: Every day | ORAL | 1 refills | Status: DC

## 2021-05-05 MED ORDER — DOXAZOSIN MESYLATE 4 MG PO TABS: 4.0000 mg | ORAL_TABLET | Freq: Every day | ORAL | 1 refills | Status: DC

## 2021-05-05 NOTE — Progress Notes (Signed)
BP 115/77   Pulse 63   Ht 5' 10"  (1.778 m)   Wt 255 lb (115.7 kg)   SpO2 95%   BMI 36.59 kg/m    Subjective:   [name], male    DOB: Jan 16, 1946, 75 y.o.   MRN: [id number]  HPI: Craig Hurley is a 75 y.o. male presenting on 05/05/2021 for Medical Management of Chronic Issues and COPD   HPI Prediabetes Patient comes in today for recheck of his diabetes. Patient has been currently taking no medication and is been diet controlled and we will see where his blood work is today.. Patient is currently on an ACE inhibitor/ARB. Patient has not seen an ophthalmologist this year. Patient denies any issues with their feet. The symptom started onset as an adult history of CVA and carotid artery disease ARE RELATED TO DM   COPD Patient is coming in for COPD recheck today.  He is currently on Symbicort and Spiriva.  He has a mild chronic cough but denies any major coughing spells or wheezing spells.  He has 2 nighttime symptoms per week and 3 daytime symptoms per week currently.  He does get some coughing spells and gets short of breath on walking but that is been improved since adding metoprolol and lisinopril  GERD Patient is currently on omeprazole.  She denies any major symptoms or abdominal pain or belching or burping. She denies any blood in her stool or lightheadedness or dizziness.   Relevant past medical, surgical, family and social history reviewed and updated as indicated. Interim medical history since our last visit reviewed. Allergies and medications reviewed and updated.  Review of Systems  Constitutional:  Negative for chills and fever.  HENT:  Negative for ear pain and tinnitus.   Respiratory:  Positive for shortness of breath (Exertional). Negative for cough, chest tightness and wheezing.   Cardiovascular:  Negative for chest pain, palpitations and leg swelling.  Gastrointestinal:  Negative for abdominal pain, blood in stool, constipation and diarrhea.   Genitourinary:  Negative for dysuria and hematuria.  Musculoskeletal:  Negative for back pain and myalgias.  Skin:  Negative for rash.  Neurological:  Negative for dizziness, weakness and headaches.  Psychiatric/Behavioral:  Negative for suicidal ideas.    Per HPI unless specifically indicated above   Allergies as of 05/05/2021       Reactions   Penicillins Swelling        Medication List        Accurate as of May 05, 2021 11:44 AM. If you have any questions, ask your nurse or doctor.          acetaminophen 325 MG tablet Commonly known as: TYLENOL Take 650 mg by mouth every 6 (six) hours as needed.   albuterol 108 (90 Base) MCG/ACT inhaler Commonly known as: VENTOLIN HFA Inhale 2 puffs into the lungs every 4 (four) hours as needed for wheezing or shortness of breath.   aspirin EC 325 MG tablet Take 1 tablet (325 mg total) by mouth daily.   atorvastatin 10 MG tablet Commonly known as: LIPITOR Take 1 tablet (10 mg total) by mouth daily.   B-12 PO Take 1,000 mcg by mouth daily.   budesonide-formoterol 160-4.5 MCG/ACT inhaler Commonly known as: SYMBICORT Inhale 2 puffs into the lungs 2 (two) times daily.   carvedilol 3.125 MG tablet Commonly known as: COREG Take 1 tablet (3.125 mg total) by mouth 2 (two) times daily.   cetirizine 10 MG tablet Commonly known as: ZYRTEC  Take 1 tablet (10 mg total) by mouth daily.   doxazosin 4 MG tablet Commonly known as: CARDURA Take 1 tablet (4 mg total) by mouth daily.   losartan 25 MG tablet Commonly known as: COZAAR Take 0.5 tablets (12.5 mg total) by mouth daily.   meloxicam 15 MG tablet Commonly known as: MOBIC Take 1 tablet (15 mg total) by mouth daily.   montelukast 10 MG tablet Commonly known as: SINGULAIR Take 1 tablet (10 mg total) by mouth at bedtime.   omeprazole 20 MG capsule Commonly known as: PRILOSEC Take 1 capsule (20 mg total) by mouth 2 (two) times daily before a meal. TAKE 1 CAPSULE BY MOUTH  TWICE DAILY BEFORE MEAL(S) What changed:  how much to take how to take this when to take this Changed by: Worthy Rancher, MD   PRESERVISION AREDS 2 PO Take 1 capsule by mouth 2 (two) times daily.   Spiriva Respimat 2.5 MCG/ACT Aers Generic drug: Tiotropium Bromide Monohydrate Inhale 2 puffs into the lungs daily.   vitamin A 8000 UNIT capsule Take 8,000 Units by mouth daily.         Objective:   BP 115/77   Pulse 63   Ht 5' 10"  (1.778 m)   Wt 255 lb (115.7 kg)   SpO2 95%   BMI 36.59 kg/m   Wt Readings from Last 3 Encounters:  05/05/21 255 lb (115.7 kg)  04/27/21 255 lb 12.8 oz (116 kg)  04/12/21 256 lb (116.1 kg)    Physical Exam Vitals and nursing note reviewed.  Constitutional:      General: He is not in acute distress.    Appearance: He is well-developed. He is not diaphoretic.  Eyes:     General: No scleral icterus.    Conjunctiva/sclera: Conjunctivae normal.  Neck:     Thyroid: No thyromegaly.  Cardiovascular:     Rate and Rhythm: Normal rate and regular rhythm.     Heart sounds: Normal heart sounds. No murmur heard. Pulmonary:     Effort: Pulmonary effort is normal. No respiratory distress.     Breath sounds: Normal breath sounds. No wheezing.  Musculoskeletal:        General: Normal range of motion.     Cervical back: Neck supple.  Lymphadenopathy:     Cervical: No cervical adenopathy.  Skin:    General: Skin is warm and dry.     Findings: No rash.  Neurological:     Mental Status: He is alert and oriented to person, place, and time.     Coordination: Coordination normal.  Psychiatric:        Behavior: Behavior normal.      Assessment & Plan:   Problem List Items Addressed This Visit       Respiratory   COPD (chronic obstructive pulmonary disease) with emphysema ([place])   Relevant Orders   CBC with Differential/Platelet   CMP14+EGFR     Digestive   GERD (gastroesophageal reflux disease)   Relevant Medications   omeprazole  (PRILOSEC) 20 MG capsule   Other Relevant Orders   CBC with Differential/Platelet     Other   Prediabetes - Primary   Relevant Orders   Bayer DCA Hb A1c Waived   CMP14+EGFR   Lipid panel   Other Visit Diagnoses     Need for shingles vaccine       Relevant Orders   Varicella-zoster vaccine IM (Shingrix) (Completed)       Continue current medication, will check blood  work today.  He continues to follow with cardiology but he says his breathing is slightly better now that they have optimize some of his medicines for his heart.  He will continue to follow with them. Follow up plan: Return in about 6 months (around 11/04/2021), or if symptoms worsen or fail to improve, for Physical exam and recheck prediabetes and COPD.  Counseling provided for all of the vaccine components Orders Placed This Encounter  Procedures   Varicella-zoster vaccine IM (Shingrix)   Bayer DCA Hb A1c Waived   CBC with Differential/Platelet   CMP14+EGFR   Lipid panel    Caryl Pina, MD [facility] 05/05/2021, 11:44 AM

## 2021-05-06 LAB — CBC WITH DIFFERENTIAL/PLATELET
Basophils Absolute: 0.1 10*3/uL (ref 0.0–0.2)
Basos: 1 %
EOS (ABSOLUTE): 0.1 10*3/uL (ref 0.0–0.4)
Eos: 1 %
Hematocrit: 46.7 % (ref 37.5–51.0)
Hemoglobin: 15 g/dL (ref 13.0–17.7)
Immature Grans (Abs): 0 10*3/uL (ref 0.0–0.1)
Immature Granulocytes: 0 %
Lymphocytes Absolute: 1.8 10*3/uL (ref 0.7–3.1)
Lymphs: 19 %
MCH: 27 pg (ref 26.6–33.0)
MCHC: 32.1 g/dL (ref 31.5–35.7)
MCV: 84 fL (ref 79–97)
Monocytes Absolute: 0.7 10*3/uL (ref 0.1–0.9)
Monocytes: 8 %
Neutrophils Absolute: 6.9 10*3/uL (ref 1.4–7.0)
Neutrophils: 71 %
Platelets: 222 10*3/uL (ref 150–450)
RBC: 5.55 x10E6/uL (ref 4.14–5.80)
RDW: 15.4 % (ref 11.6–15.4)
WBC: 9.6 10*3/uL (ref 3.4–10.8)

## 2021-05-06 LAB — CMP14+EGFR
ALT: 14 IU/L (ref 0–44)
AST: 13 IU/L (ref 0–40)
Albumin/Globulin Ratio: 1.5 (ref 1.2–2.2)
Albumin: 4 g/dL (ref 3.7–4.7)
Alkaline Phosphatase: 95 IU/L (ref 44–121)
BUN/Creatinine Ratio: 15 (ref 10–24)
BUN: 15 mg/dL (ref 8–27)
Bilirubin Total: 0.3 mg/dL (ref 0.0–1.2)
CO2: 23 mmol/L (ref 20–29)
Calcium: 9.2 mg/dL (ref 8.6–10.2)
Chloride: 103 mmol/L (ref 96–106)
Creatinine, Ser: 1.02 mg/dL (ref 0.76–1.27)
Globulin, Total: 2.7 g/dL (ref 1.5–4.5)
Glucose: 105 mg/dL — ABNORMAL HIGH (ref 65–99)
Potassium: 5.1 mmol/L (ref 3.5–5.2)
Sodium: 140 mmol/L (ref 134–144)
Total Protein: 6.7 g/dL (ref 6.0–8.5)
eGFR: 77 mL/min/{1.73_m2} (ref >59)

## 2021-05-06 LAB — LIPID PANEL
Chol/HDL Ratio: 2.8 ratio (ref 0.0–5.0)
Cholesterol, Total: 132 mg/dL (ref 100–199)
HDL: 48 mg/dL (ref >39)
LDL Chol Calc (NIH): 68 mg/dL (ref 0–99)
Triglycerides: 79 mg/dL (ref 0–149)
VLDL Cholesterol Cal: 16 mg/dL (ref 5–40)

## 2021-05-24 ENCOUNTER — Telehealth (HOSPITAL_COMMUNITY): Payer: Self-pay | Admitting: Emergency Medicine

## 2021-05-24 NOTE — Telephone Encounter (Signed)
Reaching out to patient to offer assistance regarding upcoming cardiac imaging study; pt verbalizes understanding of appt date/time, parking situation and where to check in,and verified current allergies; name and call back number provided for further questions should they arise Marchia Bond RN Navigator Cardiac Imaging [facility] [phone] office [phone] cell   Denies implants, denies claustro Has bilateral hearing aids

## 2021-05-26 ENCOUNTER — Other Ambulatory Visit: Payer: Self-pay

## 2021-05-26 ENCOUNTER — Ambulatory Visit (HOSPITAL_COMMUNITY)
Admission: RE | Admit: 2021-05-26 | Discharge: 2021-05-26 | Disposition: A | Discharge status: Home / Self Care | Payer: Medicare HMO | Source: Ambulatory Visit | Attending: Cardiology | Admitting: Cardiology

## 2021-05-26 DIAGNOSIS — I429 Cardiomyopathy, unspecified: Secondary | ICD-10-CM | POA: Diagnosis not present

## 2021-05-26 DIAGNOSIS — I5042 Chronic combined systolic (congestive) and diastolic (congestive) heart failure: Secondary | ICD-10-CM | POA: Insufficient documentation

## 2021-05-26 IMAGING — MR MR CARD MORPHOLOGY WO/W CM
45 of 48 series · 45 of 48 positions shown · IV contrast (gadavist)
Comparison: none

CLINICAL DATA: Clinical question of cardiomyopathy
Study assumes HCT

EXAM:
CARDIAC MRI
TECHNIQUE: The patient was scanned on a 1.5 Tesla GE magnet. A dedicated
cardiac coil was used. Functional imaging was done using Fiesta
sequences. [DATE], and 4 chamber views were done to assess for RWMA's.
Modified NATHEM rule using a short axis stack was used to
calculate an ejection fraction on a dedicated work station using
Circle software. The patient received 13 cc of Gadavist. After 10
minutes inversion recovery sequences were used to assess for
infiltration and scar tissue.
CONTRAST:  13 cc  of Gadavist

[Series 4: t2_[id number]_tra_bh · axial · 8.0mm · 1.56mm/px · 1 of 16 slices shown]
[im 1/16]
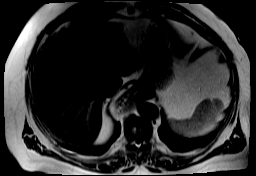

[Series 8: bSSFP · oblique · 8.0mm · 1.83mm/px · 1 of 25 slices shown (1 of 23)]
[im 1/25]
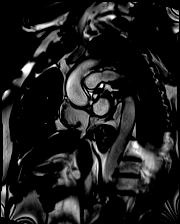

[Series 9: bSSFP · oblique · 8.0mm · 1.83mm/px · 1 of 25 slices shown (2 of 23)]
[im 1/25]
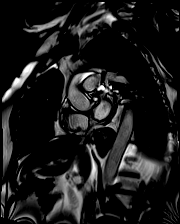

[Series 10: bSSFP · oblique · 8.0mm · 1.83mm/px · 1 of 25 slices shown (3 of 23)]
[im 1/25]
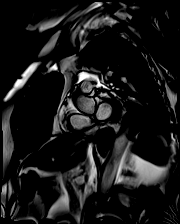

[Series 11: bSSFP · oblique · 8.0mm · 1.83mm/px · 1 of 25 slices shown (4 of 23)]
[im 1/25]
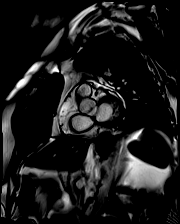

[Series 12: bSSFP · oblique · 8.0mm · 1.83mm/px · 1 of 25 slices shown (5 of 23)]
[im 1/25]
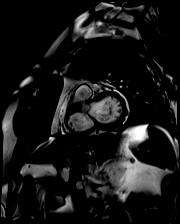

[Series 13: bSSFP · oblique · 8.0mm · 1.83mm/px · 1 of 25 slices shown (6 of 23)]
[im 1/25]
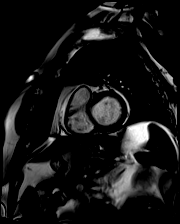

[Series 14: bSSFP · oblique · 8.0mm · 1.83mm/px · 1 of 25 slices shown (7 of 23)]
[im 1/25]
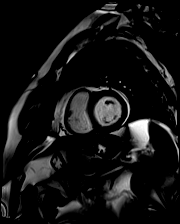

[Series 15: bSSFP · oblique · 8.0mm · 1.83mm/px · 1 of 25 slices shown (8 of 23)]
[im 1/25]
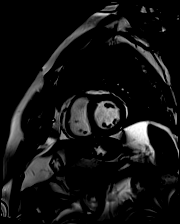

[Series 16: bSSFP · oblique · 8.0mm · 1.83mm/px · 1 of 25 slices shown (9 of 23)]
[im 1/25]
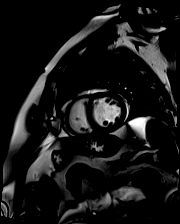

[Series 17: bSSFP · oblique · 8.0mm · 1.83mm/px · 1 of 25 slices shown (10 of 23)]
[im 1/25]
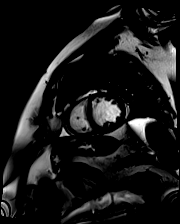

[Series 18: bSSFP · oblique · 8.0mm · 1.83mm/px · 1 of 25 slices shown (11 of 23)]
[im 1/25]
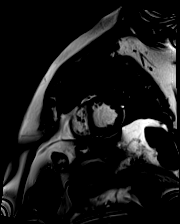

[Series 19: bSSFP · oblique · 8.0mm · 1.83mm/px · 1 of 25 slices shown (12 of 23)]
[im 1/25]
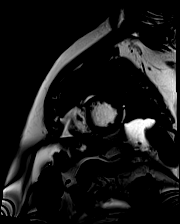

[Series 20: bSSFP · oblique · 8.0mm · 1.83mm/px · 1 of 25 slices shown (13 of 23)]
[im 1/25]
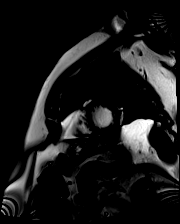

[Series 21: bSSFP · oblique · 8.0mm · 1.83mm/px · 1 of 25 slices shown (14 of 23)]
[im 1/25]
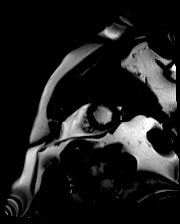

[Series 22: bSSFP · oblique · 8.0mm · 1.83mm/px · 1 of 25 slices shown (15 of 23)]
[im 1/25]
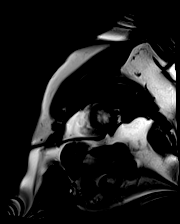

[Series 23: bSSFP · oblique · 8.0mm · 1.83mm/px · 1 of 25 slices shown (16 of 23)]
[im 1/25]
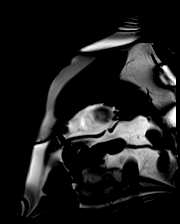

[Series 24: bSSFP · oblique · 8.0mm · 1.83mm/px · 1 of 25 slices shown (17 of 23)]
[im 1/25]
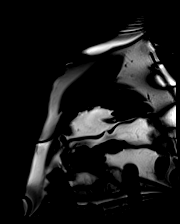

[Series 25: bSSFP · oblique · 8.0mm · 1.83mm/px · 1 of 25 slices shown (18 of 23)]
[im 1/25]
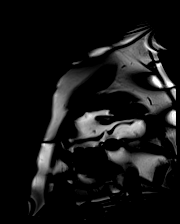

[Series 26: bSSFP · oblique · 8.0mm · 1.83mm/px · 1 of 25 slices shown (19 of 23)]
[im 1/25]
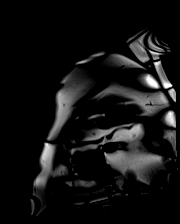

[Series 27: (id)_long_t1 · oblique · 8.0mm · 1.60mm/px · 1 of 24 slices shown]
[im 1/24]
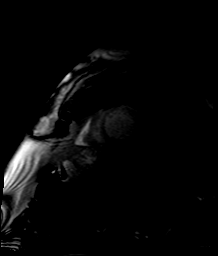

[Series 28: (id)_long_t1_moco · oblique · 8.0mm · 1.60mm/px · 1 of 24 slices shown]
[im 1/24]
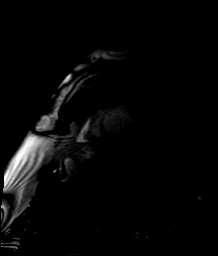

[Series 29: (id)_long_t1_moco_t1 · 1 of 3 slices shown (1 of 2)]
[im 1/3]
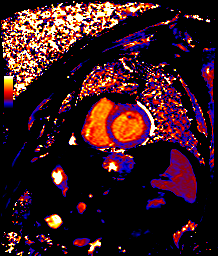

[Series 29: (id)_long_t1_moco_t1 · oblique · 8.0mm · 1.60mm/px · 1 of 3 slices shown (2 of 2)]
[im 1/3]
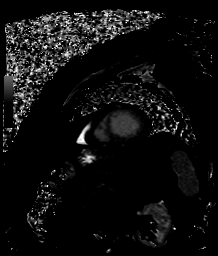

[Series 31: (id)_trufi · oblique · 8.0mm · 2.14mm/px · 1 of 9 slices shown]
[im 1/9]
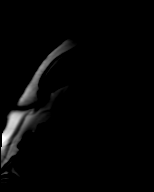

[Series 32: (id)_trufi_moco · oblique · 8.0mm · 2.14mm/px · 1 of 9 slices shown]
[im 1/9]
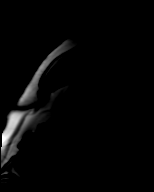

[Series 33: (id)_trufi_moco_t2 · oblique · 8.0mm · 2.14mm/px · 1 of 3 slices shown]
[im 1/3]
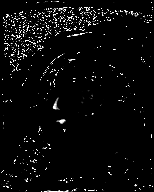

[Series 35: bSSFP · oblique · 6.0mm · 1.56mm/px · 1 of 25 slices shown (20 of 23)]
[im 1/25]
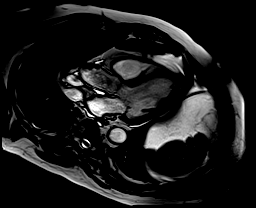

[Series 36: bSSFP · oblique · 6.0mm · 1.41mm/px · 1 of 25 slices shown (21 of 23)]
[im 1/25]
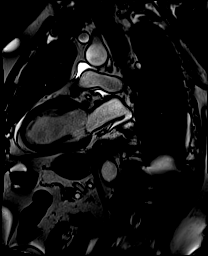

[Series 37: bSSFP · axial · 6.0mm · 1.56mm/px · 1 of 25 slices shown (22 of 23)]
[im 1/25]
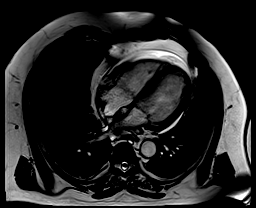

[Series 38: pre short axis · oblique · non-contrast · 8.0mm · 2.50mm/px · 1 of 10 slices shown (1 of 6)]
[im 1/10]
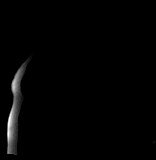

[Series 39: pre short axis · oblique · non-contrast · 8.0mm · 2.50mm/px · 1 of 10 slices shown (2 of 6)]
[im 1/10]
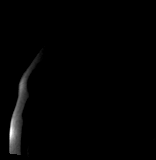

[Series 40: pre short axis · oblique · non-contrast · 8.0mm · 2.50mm/px · 1 of 10 slices shown (3 of 6)]
[im 1/10]
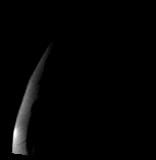

[Series 41: pre short axis · oblique · non-contrast · 8.0mm · 2.50mm/px · 1 of 10 slices shown (4 of 6)]
[im 1/10]
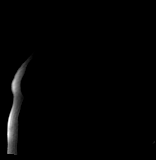

[Series 42: pre short axis · oblique · non-contrast · 8.0mm · 2.50mm/px · 1 of 10 slices shown (5 of 6)]
[im 1/10]
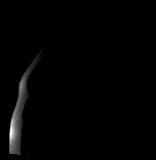

[Series 43: pre short axis · oblique · non-contrast · 8.0mm · 2.50mm/px · 1 of 10 slices shown (6 of 6)]
[im 1/10]
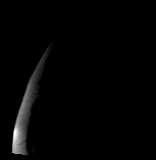

[Series 44: rest short axis · oblique · 8.0mm · 2.50mm/px · 1 of 60 slices shown (1 of 6)]
[im 1/60]
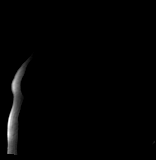

[Series 45: rest short axis · oblique · 8.0mm · 2.50mm/px · 1 of 60 slices shown (2 of 6)]
[im 1/60]
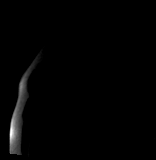

[Series 46: rest short axis · oblique · 8.0mm · 2.50mm/px · 1 of 60 slices shown (3 of 6)]
[im 1/60]
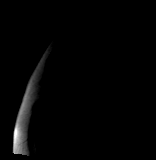

[Series 47: rest short axis · oblique · 8.0mm · 2.50mm/px · 1 of 60 slices shown (4 of 6)]
[im 1/60]
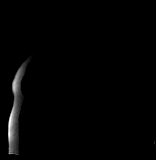

[Series 48: rest short axis · oblique · 8.0mm · 2.50mm/px · 1 of 60 slices shown (5 of 6)]
[im 1/60]
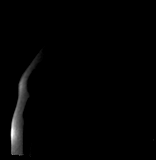

[Series 49: rest short axis · oblique · 8.0mm · 2.50mm/px · 1 of 60 slices shown (6 of 6)]
[im 1/60]
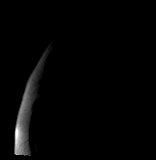

[Series 50: bSSFP · coronal · 6.0mm · 1.56mm/px · 1 of 25 slices shown (23 of 23)]
[im 1/25]
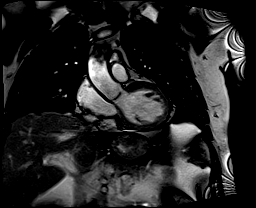

[Series 51: aortic valve cine · oblique · 6.0mm · 1.41mm/px · 1 of 25 slices shown]
[im 1/25]
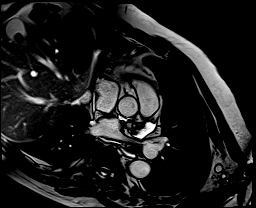

[Series 52: cine rvit · oblique · 6.0mm · 1.56mm/px · 1 of 25 slices shown]
[im 1/25]
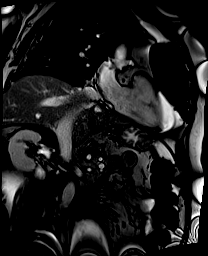

[45 of 48 positions shown; findings below may reference images not displayed]

FINDINGS: 1. Normal left ventricular size, with LVEDD 52 mm, and LVEDVi 80
mL/m2.

Normal left ventricular thickness, with intraventricular septal
thickness of 8 mm, posterior wall thickness of 8 mm.

Mild left ventricular systolic dysfunction (LVEF =41%). There are no
regional wall motion abnormalities but global hypokinesis.

Left ventricular parametric mapping notable for normal T2 signal and
normal ECV signal.

There is no late gadolinium enhancement in the left ventricular
myocardium.

2. Normal right ventricular size with RVEDVI 58 mL/m2.

Normal right ventricular thickness.

Normal right ventricular systolic function (RVEF =53%). There are no
regional wall motion abnormalities or aneurysms.

3.  Normal left and right atrial size.

4. Normal size of the aortic root, ascending aorta and pulmonary
artery.

5.  No significant valvular abnormalities.

Mitral valve regurgitation is qualitatively mild.

Aortic valve regurgitation is qualitatively no more than mild.

Tricuspid regurgitation is qualitatively no more than mild.

6.  Normal pericardium.  No pericardial effusion.

7. Grossly, no extracardiac findings. Recommended dedicated study if
concerned for non-cardiac pathology.
IMPRESSION: Mild left ventricular systolic dysfunction (LVEF =41%).

## 2021-05-26 MED ORDER — GADOBUTROL 1 MMOL/ML IV SOLN
13.0000 mL | Freq: Once | INTRAVENOUS | Status: AC | PRN
  Administered 2021-05-26: 13 mL via INTRAVENOUS

## 2021-06-09 ENCOUNTER — Encounter: Payer: Self-pay | Admitting: Family Medicine
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
## 2021-06-15 ENCOUNTER — Ambulatory Visit (INDEPENDENT_AMBULATORY_CARE_PROVIDER_SITE_OTHER): Payer: Medicare HMO

## 2021-06-15 ENCOUNTER — Encounter: Payer: Self-pay | Admitting: Cardiology

## 2021-06-15 ENCOUNTER — Ambulatory Visit (INDEPENDENT_AMBULATORY_CARE_PROVIDER_SITE_OTHER): Payer: Medicare HMO | Admitting: Cardiology

## 2021-06-15 ENCOUNTER — Other Ambulatory Visit: Payer: Self-pay

## 2021-06-15 ENCOUNTER — Other Ambulatory Visit: Payer: Self-pay | Admitting: Cardiology

## 2021-06-15 VITALS — BP 106/70 | HR 68 | Ht 70.0 in | Wt 255.0 lb

## 2021-06-15 DIAGNOSIS — R079 Chest pain, unspecified: Secondary | ICD-10-CM

## 2021-06-15 DIAGNOSIS — I5042 Chronic combined systolic (congestive) and diastolic (congestive) heart failure: Secondary | ICD-10-CM

## 2021-06-15 DIAGNOSIS — E785 Hyperlipidemia, unspecified: Secondary | ICD-10-CM | POA: Diagnosis not present

## 2021-06-15 DIAGNOSIS — R002 Palpitations: Secondary | ICD-10-CM

## 2021-06-15 DIAGNOSIS — I639 Cerebral infarction, unspecified: Secondary | ICD-10-CM

## 2021-06-15 DIAGNOSIS — I10 Essential (primary) hypertension: Secondary | ICD-10-CM

## 2021-06-15 MED ORDER — LOSARTAN POTASSIUM 25 MG PO TABS: 25.0000 mg | ORAL_TABLET | Freq: Every day | ORAL | 3 refills | Status: DC

## 2021-06-15 MED ORDER — EMPAGLIFLOZIN 10 MG PO TABS: 10.0000 mg | ORAL_TABLET | Freq: Every day | ORAL | 3 refills | Status: AC
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
## 2021-06-15 NOTE — Patient Instructions (Signed)
Medication Instructions:  INCREASE Losartan to 25 mg once daily START Jardiance 10 mg once daily  *If you need a refill on your cardiac medications before your next appointment, please call your pharmacy*   Lab Work: Please return for labs in 1 week (BMET)  Our in office lab hours are Monday-Friday 8:00-4:00, closed for lunch 12:45-1:45 pm.  No appointment needed.  Follow-Up: At [facility], you and your health needs are our priority.  As part of our continuing mission to provide you with exceptional heart care, we have created designated Provider Care Teams.  These Care Teams include your primary Cardiologist (physician) and Advanced Practice Providers (APPs -  Physician Assistants and Nurse Practitioners) who all work together to provide you with the care you need, when you need it.  We recommend signing up for the patient portal called "MyChart".  Sign up information is provided on this After Visit Summary.  MyChart is used to connect with patients for Virtual Visits (Telemedicine).  Patients are able to view lab/test results, encounter notes, upcoming appointments, etc.  Non-urgent messages can be sent to your provider as well.   To learn more about what you can do with MyChart, go to [email or web address].    Your next appointment:   3 month(s)  The format for your next appointment:   In Person  Provider:   Oswaldo Milian, MD

## 2021-06-15 NOTE — Progress Notes (Unsigned)
Patient enrolled for [employer] to ship a 14 day ZIO XT monitor to his home.   Letter with instructions sent to patient in My Chart message. If patient needs assistance with alternative monitor placement due to scar tissue, he has been instructed to call to schedule appointment to have monitor applied at our [place].

## 2021-06-17 ENCOUNTER — Encounter: Payer: Self-pay | Admitting: Family Medicine

## 2021-06-18 DIAGNOSIS — I639 Cerebral infarction, unspecified: Secondary | ICD-10-CM

## 2021-06-18 DIAGNOSIS — R002 Palpitations: Secondary | ICD-10-CM | POA: Diagnosis not present

## 2021-06-22 ENCOUNTER — Other Ambulatory Visit: Payer: Medicare HMO

## 2021-06-22 ENCOUNTER — Other Ambulatory Visit: Payer: Self-pay

## 2021-06-22 DIAGNOSIS — I5042 Chronic combined systolic (congestive) and diastolic (congestive) heart failure: Secondary | ICD-10-CM | POA: Diagnosis not present

## 2021-06-23 LAB — BASIC METABOLIC PANEL
BUN/Creatinine Ratio: 21 (ref 10–24)
BUN: 21 mg/dL (ref 8–27)
CO2: 23 mmol/L (ref 20–29)
Calcium: 9.2 mg/dL (ref 8.6–10.2)
Chloride: 105 mmol/L (ref 96–106)
Creatinine, Ser: 0.98 mg/dL (ref 0.76–1.27)
Glucose: 75 mg/dL (ref 65–99)
Potassium: 4.6 mmol/L (ref 3.5–5.2)
Sodium: 143 mmol/L (ref 134–144)
eGFR: 80 mL/min/{1.73_m2} (ref >59)

## 2021-07-07 DIAGNOSIS — R002 Palpitations: Secondary | ICD-10-CM | POA: Diagnosis not present

## 2021-07-07 DIAGNOSIS — I639 Cerebral infarction, unspecified: Secondary | ICD-10-CM | POA: Diagnosis not present

## 2021-07-20 ENCOUNTER — Ambulatory Visit (INDEPENDENT_AMBULATORY_CARE_PROVIDER_SITE_OTHER): Payer: Medicare HMO

## 2021-07-20 VITALS — Ht 70.0 in | Wt 255.0 lb

## 2021-07-20 DIAGNOSIS — Z0000 Encounter for general adult medical examination without abnormal findings: Secondary | ICD-10-CM | POA: Diagnosis not present

## 2021-07-20 NOTE — Progress Notes (Signed)
Subjective:   Craig Hurley is a 75 y.o. male who presents for Medicare Annual/Subsequent preventive examination.  Virtual Visit via Telephone Note  I connected with  Craig Hurley on 07/20/21 at  9:00 AM EDT by telephone and verified that I am speaking with the correct person using two identifiers.  Location: Patient: Home Provider: [facility] Persons participating in the virtual visit: patient, wife, [place] Advisor   I discussed the limitations, risks, security and privacy concerns of performing an evaluation and management service by telephone and the availability of in person appointments. The patient expressed understanding and agreed to proceed.  Interactive audio and video telecommunications were attempted between this nurse and patient, however failed, due to patient having technical difficulties OR patient did not have access to video capability.  We continued and completed visit with audio only.  Some vital signs may be absent or patient reported.   Teriyah Purington E Haidan Nhan, LPN   Review of Systems     Cardiac Risk Factors include: advanced age (>3mn, >>29women);male gender;obesity (BMI >30kg/m2);sedentary lifestyle;dyslipidemia;hypertension;smoking/ tobacco exposure;Other (see comment), Risk factor comments: COPD, hx of CVA     Objective:    Today's Vitals   07/20/21 0914  Weight: 255 lb (115.7 kg)  Height: '5\' 10"'$  (1.778 m)   Body mass index is 36.59 kg/m.  Advanced Directives 07/20/2021 07/19/2020 07/14/2019 11/05/2018 07/11/2018 07/05/2017 11/23/2015  Does Patient Have a Medical Advance Directive? Yes Yes Yes No Yes No No  Type of AParamed[place]of [place] will Living will Living will - [place] will - -  Does patient want to make changes to medical advance directive? - No - Patient declined No - Patient declined - - - -  Copy of [place] Chart? No - copy requested - - - No - copy  requested - -  Would patient like information on creating a medical advance directive? - - - - Yes (MAU/Ambulatory/Procedural Areas - Information given) Yes (MAU/Ambulatory/Procedural Areas - Information given) -    Current Medications (verified) Outpatient Encounter Medications as of 07/20/2021  Medication Sig   acetaminophen (TYLENOL) 325 MG tablet Take 650 mg by mouth every 6 (six) hours as needed.   albuterol (VENTOLIN HFA) 108 (90 Base) MCG/ACT inhaler Inhale 2 puffs into the lungs every 4 (four) hours as needed for wheezing or shortness of breath.   aspirin EC 325 MG tablet Take 1 tablet (325 mg total) by mouth daily.   atorvastatin (LIPITOR) 10 MG tablet Take 1 tablet (10 mg total) by mouth daily.   budesonide-formoterol (SYMBICORT) 160-4.5 MCG/ACT inhaler Inhale 2 puffs into the lungs 2 (two) times daily.   carvedilol (COREG) 3.125 MG tablet Take 1 tablet (3.125 mg total) by mouth 2 (two) times daily.   cetirizine (ZYRTEC) 10 MG tablet Take 1 tablet (10 mg total) by mouth daily.   Cyanocobalamin (B-12 PO) Take 1,000 mcg by mouth daily.   doxazosin (CARDURA) 4 MG tablet Take 1 tablet (4 mg total) by mouth daily.   empagliflozin (JARDIANCE) 10 MG TABS tablet Take 1 tablet (10 mg total) by mouth daily before breakfast.   losartan (COZAAR) 25 MG tablet Take 1 tablet (25 mg total) by mouth daily.   meloxicam (MOBIC) 15 MG tablet Take 1 tablet (15 mg total) by mouth daily.   montelukast (SINGULAIR) 10 MG tablet Take 1 tablet (10 mg total) by mouth at bedtime.   Multiple Vitamins-Minerals (PRESERVISION AREDS 2 PO) Take 1 capsule  by mouth 2 (two) times daily.   omeprazole (PRILOSEC) 20 MG capsule Take 1 capsule (20 mg total) by mouth 2 (two) times daily before a meal. TAKE 1 CAPSULE BY MOUTH TWICE DAILY BEFORE MEAL(S)   Tiotropium Bromide Monohydrate (SPIRIVA RESPIMAT) 2.5 MCG/ACT AERS Inhale 2 puffs into the lungs daily.   vitamin A 8000 UNIT capsule Take 8,000 Units by mouth daily.   No  facility-administered encounter medications on file as of 07/20/2021.    Allergies (verified) Penicillins   History: Past Medical History:  Diagnosis Date   Allergy    Arthritis    Asthma    Cataract    Headache    Stroke [facility])    Past Surgical History:  Procedure Laterality Date   BACK SURGERY  2005   lumbar   EYE SURGERY     cataracts   FRACTURE SURGERY Right    SHOULDER ARTHROSCOPY Right    SKIN GRAFT FULL THICKNESS TRUNK     VASECTOMY     Family History  Adopted: Yes  Problem Relation Age of Onset   Colon cancer Mother    Cirrhosis Father    Social History   Socioeconomic History   Marital status: Married    Spouse name: Blanch Media   Number of children: 2   Years of education: 12   Highest education level: GED or equivalent  Occupational History   Occupation: Retired  Tobacco Use   Smoking status: Former    Types: Cigars   Smokeless tobacco: Current    Types: Snuff  Vaping Use   Vaping Use: Never used  Substance and Sexual Activity   Alcohol use: No   Drug use: No   Sexual activity: Yes  Other Topics Concern   Not on file  Social History Narrative   Lives at home with his wife.   Right-handed.   12 cups coffee day.   Social Determinants of Health   Financial Resource Strain: Low Risk    Difficulty of Paying Living Expenses: Not hard at all  Food Insecurity: No Food Insecurity   Worried About Charity fundraiser in the Last Year: Never true   [place] in the Last Year: Never true  Transportation Needs: No Transportation Needs   Lack of Transportation (Medical): No   Lack of Transportation (Non-Medical): No  Physical Activity: Insufficiently Active   Days of Exercise per Week: 7 days   Minutes of Exercise per Session: 10 min  Stress: No Stress Concern Present   Feeling of Stress : Not at all  Social Connections: Moderately Isolated   Frequency of Communication with Friends and Family: More than three times a week   Frequency of Social  Gatherings with Friends and Family: Once a week   Attends Religious Services: Never   Marine scientist or Organizations: No   Attends Music therapist: Never   Marital Status: Married    Tobacco Counseling Ready to quit: Not Answered Counseling given: Not Answered   Clinical Intake:  Pre-visit preparation completed: Yes  Pain : No/denies pain     BMI - recorded: 36.59 Nutritional Status: BMI > 30  Obese Nutritional Risks: None Diabetes: No  How often do you need to have someone help you when you read instructions, pamphlets, or other written materials from your doctor or pharmacy?: 3 - Sometimes  Diabetic? No (pre-diabetic)  Interpreter Needed?: No  Information entered by :: Levonne Carreras, LPN   Activities of Daily Living In  your present state of health, do you have any difficulty performing the following activities: 07/20/2021  Hearing? Y  Comment wears hearing aids  Vision? N  Difficulty concentrating or making decisions? Y  Comment since stroke in 2021  Walking or climbing stairs? N  Dressing or bathing? N  Doing errands, shopping? Y  Comment forgets simple directions - wife Restaurant manager, fast food and eating ? N  Using the Toilet? N  In the past six months, have you accidently leaked urine? N  Do you have problems with loss of bowel control? N  Managing your Medications? Y  Comment wife puts in medicine box weekly and reminds him  Managing your Finances? Y  Housekeeping or managing your Housekeeping? Y  Some recent data might be hidden    Patient Care Team: Dettinger, Fransisca Kaufmann, MD as PCP - General (Family Medicine) Lavera Guise, [facility] (Pharmacist)  Indicate any recent Medical Services you may have received from other than [facility] providers in the past year (date may be approximate).     Assessment:   This is a routine wellness examination for Craig Hurley.  Hearing/Vision screen Hearing Screening - Comments:: Wears hearing aids - sees  [facility] annually Vision Screening - Comments:: Wears eyeglasses - up to date with annual eye exam with Dr Rosana Hoes in [place]  Dietary issues and exercise activities discussed: Current Exercise Habits: Home exercise routine, Type of exercise: walking, Time (Minutes): 10, Frequency (Times/Week): 7, Weekly Exercise (Minutes/Week): 70, Intensity: Mild, Exercise limited by: orthopedic condition(s);cardiac condition(s);respiratory conditions(s);neurologic condition(s);psychological condition(s)   Goals Addressed             This Visit's Progress    Exercise 3x per week (30 min per time)   On track      Depression Screen PHQ 2/9 Scores 07/20/2021 05/05/2021 11/04/2020 09/22/2020 07/19/2020 05/05/2020 03/08/2020  PHQ - 2 Score 0 0 0 0 0 0 0    Fall Risk Fall Risk  07/20/2021 05/05/2021 11/04/2020 09/22/2020 07/19/2020  Falls in the past year? 0 0 0 0 0  Number falls in past yr: 0 - - - -  Injury with Fall? 0 - - - -  Risk for fall due to : Orthopedic patient;Impaired balance/gait;Impaired vision - - - -  Follow up Education provided;Falls prevention discussed - - - -    FALL RISK PREVENTION PERTAINING TO THE HOME:  Any stairs in or around the home? Yes  If so, are there any without handrails? No  Home free of loose throw rugs in walkways, pet beds, electrical cords, etc? Yes  Adequate lighting in your home to reduce risk of falls? Yes   ASSISTIVE DEVICES UTILIZED TO PREVENT FALLS:  Life alert? No  Use of a cane, walker or w/c? Yes  Grab bars in the bathroom? Yes  Shower chair or bench in shower? Yes  Elevated toilet seat or a handicapped toilet? Yes   TIMED UP AND GO:  Was the test performed? No . Telephonic visit  Cognitive Function: MMSE - Mini Mental State Exam 07/11/2018 07/05/2017  Orientation to time 4 4  Orientation to Place 5 5  Registration 3 3  Attention/ Calculation 4 5  Recall 3 2  Language- name 2 objects 2 2  Language- repeat 1 1  Language- follow 3 step command 3 3   Language- read & follow direction 1 1  Write a sentence 1 1  Copy design 1 1  Total score 28 28     6CIT Screen  07/20/2021 07/19/2020 07/14/2019  What Year? 0 points 0 points 0 points  What month? 0 points 0 points 0 points  What time? 0 points 0 points 0 points  Count back from 20 0 points 0 points 0 points  Months in reverse 4 points 0 points 2 points  Repeat phrase 6 points 0 points 2 points  Total Score 10 0 4    Immunizations Immunization History  Administered Date(s) Administered   Fluad Quad(high Dose 65+) 08/06/2019, 09/22/2020   H1N1 01/14/2009   Influenza Split 12/25/2005, 10/12/2009, 11/07/2010, 07/18/2011, 11/14/2012, 08/30/2013   Influenza, High Dose Seasonal PF 09/28/2017, 09/11/2018   Influenza,inj,Quad PF,6+ Mos 09/12/2014, 08/18/2015, 10/25/2016   Influenza-Unspecified 09/11/2018   Moderna SARS-COV2 Booster Vaccination 11/10/2020   Moderna Sars-Covid-2 Vaccination 02/06/2020, 03/09/2020, 06/09/2021   Pneumococcal Conjugate-13 01/18/2015   Pneumococcal Polysaccharide-23 01/12/2011   Td 11/22/1998, 07/11/2018   Tdap 01/08/2008   Zoster Recombinat (Shingrix) 05/05/2021   Zoster, Live 01/17/2012    TDAP status: Up to date  Flu Vaccine status: Up to date  Pneumococcal vaccine status: Up to date  Covid-19 vaccine status: Completed vaccines  Qualifies for Shingles Vaccine? Yes   Zostavax completed Yes   Shingrix Completed?: No.    Education has been provided regarding the importance of this vaccine. Patient has been advised to call insurance company to determine out of pocket expense if they have not yet received this vaccine. Advised may also receive vaccine at local pharmacy or Health Dept. Verbalized acceptance and understanding.  Screening Tests Health Maintenance  Topic Date Due   Zoster Vaccines- Shingrix (2 of 2) 06/30/2021   INFLUENZA VACCINE  06/27/2021   COVID-19 Vaccine (5 - Booster for Moderna series) 10/10/2021   TETANUS/TDAP  07/11/2028    Hepatitis C Screening  Completed   PNA vac Low Risk Adult  Addressed   HPV VACCINES  Aged Out    Health Maintenance  Health Maintenance Due  Topic Date Due   Zoster Vaccines- Shingrix (2 of 2) 06/30/2021   INFLUENZA VACCINE  06/27/2021    Colorectal cancer screening: Type of screening: Colonoscopy. Completed 02/25/2015. Repeat every 10 years  Lung Cancer Screening: (Low Dose CT Chest recommended if Age 35-80 years, 30 pack-year currently smoking OR have quit w/in 15years.) does not qualify.   Additional Screening:  Hepatitis C Screening: does qualify; Completed 11/08/2015  Vision Screening: Recommended annual ophthalmology exams for early detection of glaucoma and other disorders of the eye. Is the patient up to date with their annual eye exam?  Yes  Who is the provider or what is the name of the office in which the patient attends annual eye exams? [facility] in [place] If pt is not established with a provider, would they like to be referred to a provider to establish care? No .   Dental Screening: Recommended annual dental exams for proper oral hygiene  Community Resource Referral / Chronic Care Management: CRR required this visit?  No   CCM required this visit?  No      Plan:     I have personally reviewed and noted the following in the patient's chart:   Medical and social history Use of alcohol, tobacco or illicit drugs  Current medications and supplements including opioid prescriptions. Patient is not currently taking opioid prescriptions. Functional ability and status Nutritional status Physical activity Advanced directives List of other physicians Hospitalizations, surgeries, and ER visits in previous 12 months Vitals Screenings to include cognitive, depression, and falls Referrals and appointments  In  addition, I have reviewed and discussed with patient certain preventive protocols, quality metrics, and best practice recommendations. A written personalized care  plan for preventive services as well as general preventive health recommendations were provided to patient.     Sandrea Hammond, LPN   [id number]   Nurse Notes: Cognitive screening abnormal

## 2021-07-20 NOTE — Patient Instructions (Signed)
[name] , Thank you for taking time to come for your Medicare Wellness Visit. I appreciate your ongoing commitment to your health goals. Please review the following plan we discussed and let me know if I can assist you in the future.   Screening recommendations/referrals: Colonoscopy: Done 02/25/2015 - Repeat in 10 years Recommended yearly ophthalmology/optometry visit for glaucoma screening and checkup Recommended yearly dental visit for hygiene and checkup  Vaccinations: Influenza vaccine: Done 09/22/2020 - Repeat annually Pneumococcal vaccine: Done 01/12/2011 & 01/18/2015 Tdap vaccine: Done 07/11/2018 - Repeat in 10 years Shingles vaccine: Done 05/05/2021 - due for second dose   Covid-19: Done 02/06/20, 03/09/20, 11/10/20, & 06/09/21  Advanced directives: Please bring a copy of your health care power of attorney and living will to the office to be added to your chart at your convenience.   Conditions/risks identified: Aim for 30 minutes of exercise or brisk walking each day, drink 6-8 glasses of water and eat lots of fruits and vegetables.   Next appointment: Follow up in one year for your annual wellness visit.   Preventive Care 73 Years and Older, Male  Preventive care refers to lifestyle choices and visits with your health care provider that can promote health and wellness. What does preventive care include? A yearly physical exam. This is also called an annual well check. Dental exams once or twice a year. Routine eye exams. Ask your health care provider how often you should have your eyes checked. Personal lifestyle choices, including: Daily care of your teeth and gums. Regular physical activity. Eating a healthy diet. Avoiding tobacco and drug use. Limiting alcohol use. Practicing safe sex. Taking low doses of aspirin every day. Taking vitamin and mineral supplements as recommended by your health care provider. What happens during an annual well check? The services and  screenings done by your health care provider during your annual well check will depend on your age, overall health, lifestyle risk factors, and family history of disease. Counseling  Your health care provider may ask you questions about your: Alcohol use. Tobacco use. Drug use. Emotional well-being. Home and relationship well-being. Sexual activity. Eating habits. History of falls. Memory and ability to understand (cognition). Work and work Statistician. Screening  You may have the following tests or measurements: Height, weight, and BMI. Blood pressure. Lipid and cholesterol levels. These may be checked every 5 years, or more frequently if you are over 25 years old. Skin check. Lung cancer screening. You may have this screening every year starting at age 78 if you have a 30-pack-year history of smoking and currently smoke or have quit within the past 15 years. Fecal occult blood test (FOBT) of the stool. You may have this test every year starting at age 3. Flexible sigmoidoscopy or colonoscopy. You may have a sigmoidoscopy every 5 years or a colonoscopy every 10 years starting at age 33. Prostate cancer screening. Recommendations will vary depending on your family history and other risks. Hepatitis C blood test. Hepatitis B blood test. Sexually transmitted disease (STD) testing. Diabetes screening. This is done by checking your blood sugar (glucose) after you have not eaten for a while (fasting). You may have this done every 1-3 years. Abdominal aortic aneurysm (AAA) screening. You may need this if you are a current or former smoker. Osteoporosis. You may be screened starting at age 20 if you are at high risk. Talk with your health care provider about your test results, treatment options, and if necessary, the need for more tests.  Vaccines  Your health care provider may recommend certain vaccines, such as: Influenza vaccine. This is recommended every year. Tetanus, diphtheria, and  acellular pertussis (Tdap, Td) vaccine. You may need a Td booster every 10 years. Zoster vaccine. You may need this after age 33. Pneumococcal 13-valent conjugate (PCV13) vaccine. One dose is recommended after age 59. Pneumococcal polysaccharide (PPSV23) vaccine. One dose is recommended after age 61. Talk to your health care provider about which screenings and vaccines you need and how often you need them. This information is not intended to replace advice given to you by your health care provider. Make sure you discuss any questions you have with your health care provider. Document Released: 12/10/2015 Document Revised: 08/02/2016 Document Reviewed: 09/14/2015 Elsevier Interactive Patient Education  2017 [employer] Prevention in the Home Falls can cause injuries. They can happen to people of all ages. There are many things you can do to make your home safe and to help prevent falls. What can I do on the outside of my home? Regularly fix the edges of walkways and driveways and fix any cracks. Remove anything that might make you trip as you walk through a door, such as a raised step or threshold. Trim any bushes or trees on the path to your home. Use bright outdoor lighting. Clear any walking paths of anything that might make someone trip, such as rocks or tools. Regularly check to see if handrails are loose or broken. Make sure that both sides of any steps have handrails. Any raised decks and porches should have guardrails on the edges. Have any leaves, snow, or ice cleared regularly. Use sand or salt on walking paths during winter. Clean up any spills in your garage right away. This includes oil or grease spills. What can I do in the bathroom? Use night lights. Install grab bars by the toilet and in the tub and shower. Do not use towel bars as grab bars. Use non-skid mats or decals in the tub or shower. If you need to sit down in the shower, use a plastic, non-slip stool. Keep  the floor dry. Clean up any water that spills on the floor as soon as it happens. Remove soap buildup in the tub or shower regularly. Attach bath mats securely with double-sided non-slip rug tape. Do not have throw rugs and other things on the floor that can make you trip. What can I do in the bedroom? Use night lights. Make sure that you have a light by your bed that is easy to reach. Do not use any sheets or blankets that are too big for your bed. They should not hang down onto the floor. Have a firm chair that has side arms. You can use this for support while you get dressed. Do not have throw rugs and other things on the floor that can make you trip. What can I do in the kitchen? Clean up any spills right away. Avoid walking on wet floors. Keep items that you use a lot in easy-to-reach places. If you need to reach something above you, use a strong step stool that has a grab bar. Keep electrical cords out of the way. Do not use floor polish or wax that makes floors slippery. If you must use wax, use non-skid floor wax. Do not have throw rugs and other things on the floor that can make you trip. What can I do with my stairs? Do not leave any items on the stairs. Make sure that  there are handrails on both sides of the stairs and use them. Fix handrails that are broken or loose. Make sure that handrails are as long as the stairways. Check any carpeting to make sure that it is firmly attached to the stairs. Fix any carpet that is loose or worn. Avoid having throw rugs at the top or bottom of the stairs. If you do have throw rugs, attach them to the floor with carpet tape. Make sure that you have a light switch at the top of the stairs and the bottom of the stairs. If you do not have them, ask someone to add them for you. What else can I do to help prevent falls? Wear shoes that: Do not have high heels. Have rubber bottoms. Are comfortable and fit you well. Are closed at the toe. Do not  wear sandals. If you use a stepladder: Make sure that it is fully opened. Do not climb a closed stepladder. Make sure that both sides of the stepladder are locked into place. Ask someone to hold it for you, if possible. Clearly mark and make sure that you can see: Any grab bars or handrails. First and last steps. Where the edge of each step is. Use tools that help you move around (mobility aids) if they are needed. These include: Canes. Walkers. Scooters. Crutches. Turn on the lights when you go into a dark area. Replace any light bulbs as soon as they burn out. Set up your furniture so you have a clear path. Avoid moving your furniture around. If any of your floors are uneven, fix them. If there are any pets around you, be aware of where they are. Review your medicines with your doctor. Some medicines can make you feel dizzy. This can increase your chance of falling. Ask your doctor what other things that you can do to help prevent falls. This information is not intended to replace advice given to you by your health care provider. Make sure you discuss any questions you have with your health care provider. Document Released: 09/09/2009 Document Revised: 04/20/2016 Document Reviewed: 12/18/2014 Elsevier Interactive Patient Education  2017 [employer].

## 2021-07-21 ENCOUNTER — Ambulatory Visit (INDEPENDENT_AMBULATORY_CARE_PROVIDER_SITE_OTHER): Payer: Medicare HMO | Admitting: Pharmacist

## 2021-07-21 ENCOUNTER — Other Ambulatory Visit: Payer: Self-pay

## 2021-07-21 DIAGNOSIS — J441 Chronic obstructive pulmonary disease with (acute) exacerbation: Secondary | ICD-10-CM

## 2021-07-21 DIAGNOSIS — I5042 Chronic combined systolic (congestive) and diastolic (congestive) heart failure: Secondary | ICD-10-CM

## 2021-07-21 DIAGNOSIS — J438 Other emphysema: Secondary | ICD-10-CM | POA: Diagnosis not present

## 2021-07-21 NOTE — Progress Notes (Signed)
Chronic Care Management Pharmacy Note  07/22/2021 Name:  Craig Hurley MRN:  [id number] DOB:  [birth year]  Summary: CHF/patient assistance  Recommendations/Changes made from today's visit: Heart Failure,  Appropriately managed by cardiology/pcp; current treatment: ARB, BB, new start jardiance (titrate) Chronic combined systolic and diastolic heart failure: EF 40 to 45% after CVA in January 2021.  Echocardiogram on 04/12/2021 showed LVEF 45 to 50% Assessed patient finances. Will enroll in BI cares patient assistance for jardiance--tolerating samples well  Follow Up Plan: Telephone follow up appointment with care management team member scheduled for: 4 weeks  Subjective: Craig Hurley is an 75 y.o. year old male who is a primary patient of Dettinger, Fransisca Kaufmann, MD.  The CCM team was consulted for assistance with disease management and care coordination needs.    Engaged with patient face to face for initial visit in response to provider referral for pharmacy case management and/or care coordination services.   Consent to Services:  The patient was given the following information about Chronic Care Management services today, agreed to services, and gave verbal consent: 1. CCM service includes personalized support from designated clinical staff supervised by the primary care provider, including individualized plan of care and coordination with other care providers 2. 24/7 contact phone numbers for assistance for urgent and routine care needs. 3. Service will only be billed when office clinical staff spend 20 minutes or more in a month to coordinate care. 4. Only one practitioner may furnish and bill the service in a calendar month. 5.The patient may stop CCM services at any time (effective at the end of the month) by phone call to the office staff. 6. The patient will be responsible for cost sharing (co-pay) of up to 20% of the service fee (after annual deductible is met). Patient agreed to  services and consent obtained.  Patient Care Team: Dettinger, Fransisca Kaufmann, MD as PCP - General (Family Medicine) Lavera Guise, [facility] (Pharmacist) Leticia Clas, [place] (Optometry) Donato Heinz, MD as Consulting Physician (Cardiology) Marcial Pacas, MD as Consulting Physician (Neurology)  Objective:  Lab Results  Component Value Date   CREATININE 0.98 06/22/2021   CREATININE 1.02 05/05/2021   CREATININE 0.99 11/04/2020    Lab Results  Component Value Date   HGBA1C 5.8 05/05/2021   Last diabetic Eye exam: No results found for: HMDIABEYEEXA  Last diabetic Foot exam: No results found for: HMDIABFOOTEX      Component Value Date/Time   CHOL 132 05/05/2021 1152   TRIG 79 05/05/2021 1152   HDL 48 05/05/2021 1152   CHOLHDL 2.8 05/05/2021 1152   [place] 68 05/05/2021 1152    Hepatic Function Latest Ref Rng & Units 05/05/2021 11/04/2020 05/05/2020  Total Protein 6.0 - 8.5 g/dL 6.7 6.5 6.4  Albumin 3.7 - 4.7 g/dL 4.0 4.0 4.1  AST 0 - 40 IU/L 13 9 12   ALT 0 - 44 IU/L 14 11 11   Alk Phosphatase 44 - 121 IU/L 95 91 91  Total Bilirubin 0.0 - 1.2 mg/dL 0.3 0.3 0.2    Lab Results  Component Value Date/Time   TSH 4.320 12/09/2020 09:01 AM    CBC Latest Ref Rng & Units 05/05/2021 09/22/2020 01/27/2020  WBC 3.4 - 10.8 x10E3/uL 9.6 9.6 7.7  Hemoglobin 13.0 - 17.7 g/dL 15.0 15.8 13.1  Hematocrit 37.5 - 51.0 % 46.7 47.6 38.8  Platelets 150 - 450 x10E3/uL 222 200 318    No results found for: VD25OH  Clinical ASCVD: Yes  The ASCVD  Risk score Mikey Bussing DC Jr., et al., 2013) failed to calculate for the following reasons:   The patient has a prior MI or stroke diagnosis    Other: (CHADS2VASc if Afib, PHQ9 if depression, MMRC or CAT for COPD, ACT, DEXA)  Social History   Tobacco Use  Smoking Status Former   Types: Cigars  Smokeless Tobacco Current   Types: Snuff   BP Readings from Last 3 Encounters:  06/15/21 106/70  05/05/21 115/77  04/27/21 134/78   Pulse Readings from Last 3  Encounters:  06/15/21 68  05/05/21 63  04/27/21 61   Wt Readings from Last 3 Encounters:  07/20/21 255 lb (115.7 kg)  06/15/21 255 lb (115.7 kg)  05/05/21 255 lb (115.7 kg)    Assessment: Review of patient past medical history, allergies, medications, health status, including review of consultants reports, laboratory and other test data, was performed as part of comprehensive evaluation and provision of chronic care management services.   SDOH:  (Social Determinants of Health) assessments and interventions performed:    CCM Care Plan  Allergies  Allergen Reactions   Penicillins Swelling    Medications Reviewed Today     Reviewed by Sandrea Hammond, LPN (Licensed Practical Nurse) on 07/20/21 at [place] List Status: <None>   Medication Order Taking? Sig Documenting Provider Last Dose Status Informant  acetaminophen (TYLENOL) 325 MG tablet [id number] Yes Take 650 mg by mouth every 6 (six) hours as needed. [provider] Taking Active   albuterol (VENTOLIN HFA) 108 (90 Base) MCG/ACT inhaler [id number] Yes Inhale 2 puffs into the lungs every 4 (four) hours as needed for wheezing or shortness of breath. Marrian Salvage, FNP Taking Active   aspirin EC 325 MG tablet [id number] Yes Take 1 tablet (325 mg total) by mouth daily. Marcial Pacas, MD Taking Active   atorvastatin (LIPITOR) 10 MG tablet [id number] Yes Take 1 tablet (10 mg total) by mouth daily. Dettinger, Fransisca Kaufmann, MD Taking Active   budesonide-formoterol [place]) 160-4.5 MCG/ACT inhaler [id number] Yes Inhale 2 puffs into the lungs 2 (two) times daily. Dettinger, Fransisca Kaufmann, MD Taking Active   carvedilol (COREG) 3.125 MG tablet [id number] Yes Take 1 tablet (3.125 mg total) by mouth 2 (two) times daily. Donato Heinz, MD Taking Active   cetirizine (ZYRTEC) 10 MG tablet [id number] Yes Take 1 tablet (10 mg total) by mouth daily. Dettinger, Fransisca Kaufmann, MD Taking Active   Cyanocobalamin (B-12 PO) [id number] Yes Take 1,000  mcg by mouth daily. [provider] Taking Active   doxazosin (CARDURA) 4 MG tablet [id number] Yes Take 1 tablet (4 mg total) by mouth daily. Dettinger, Fransisca Kaufmann, MD Taking Active   empagliflozin (JARDIANCE) 10 MG TABS tablet [id number] Yes Take 1 tablet (10 mg total) by mouth daily before breakfast. Donato Heinz, MD Taking Active   losartan (COZAAR) 25 MG tablet [id number] Yes Take 1 tablet (25 mg total) by mouth daily. Donato Heinz, MD Taking Active   meloxicam [facility]) 15 MG tablet [id number] Yes Take 1 tablet (15 mg total) by mouth daily. Dettinger, Fransisca Kaufmann, MD Taking Active   montelukast (SINGULAIR) 10 MG tablet [id number] Yes Take 1 tablet (10 mg total) by mouth at bedtime. Dettinger, Fransisca Kaufmann, MD Taking Active   Multiple Vitamins-Minerals (PRESERVISION AREDS 2 PO) [id number] Yes Take 1 capsule by mouth 2 (two) times daily. [provider] Taking Active   omeprazole (PRILOSEC) 20 MG capsule [id number] Yes Take 1 capsule (20 mg total) by mouth 2 (  two) times daily before a meal. TAKE 1 CAPSULE BY MOUTH TWICE DAILY BEFORE MEAL(S) Dettinger, Fransisca Kaufmann, MD Taking Active   Tiotropium Bromide Monohydrate (SPIRIVA RESPIMAT) 2.5 MCG/ACT AERS [id number] Yes Inhale 2 puffs into the lungs daily. [provider] Taking Active   vitamin A 8000 UNIT capsule [id number] Yes Take 8,000 Units by mouth daily. [provider] Taking Active             Patient Active Problem List   Diagnosis Date Noted   Chronic combined systolic and diastolic heart failure ([place]) 07/28/2021   Palpitation 02/19/2021   Cerebrovascular accident (CVA) due to thrombosis of left middle cerebral artery ([place]) 12/09/2020   Aphasia 12/09/2020   Memory loss 12/09/2020   Stenosis of left carotid artery 12/09/2020   Anemia 01/03/2020   Constipation 01/03/2020   Elevated LFTs 01/03/2020   Leukocytosis 01/03/2020   Pneumonia due to COVID-19 virus 01/03/2020   Acute hypoxemic respiratory  failure ([place]) 01/01/2020   Epistaxis 01/01/2020   Dysphagia 01/01/2020   Acute ischemic left MCA stroke ([place]) 12/25/2019   COVID-19 virus detected 12/25/2019   Lung nodule 01/23/2019   Multiple nodules of lung 01/03/2019   Prediabetes 05/01/2018   Obesity 10/25/2016   COPD (chronic obstructive pulmonary disease) with emphysema ([place]) 11/08/2015   BPH (benign prostatic hyperplasia) 10/25/2015   GERD (gastroesophageal reflux disease) 10/25/2015    Immunization History  Administered Date(s) Administered   Fluad Quad(high Dose 65+) 08/06/2019, 09/22/2020   H1N1 01/14/2009   Influenza Split 12/25/2005, 10/12/2009, 11/07/2010, 07/18/2011, 11/14/2012, 08/30/2013   Influenza, High Dose Seasonal PF 09/28/2017, 09/11/2018   Influenza,inj,Quad PF,6+ Mos 09/12/2014, 08/18/2015, 10/25/2016   Influenza-Unspecified 09/11/2018   Moderna SARS-COV2 Booster Vaccination 11/10/2020   Moderna Sars-Covid-2 Vaccination 02/06/2020, 03/09/2020, 06/09/2021   Pneumococcal Conjugate-13 01/18/2015   Pneumococcal Polysaccharide-23 01/12/2011   Td 11/22/1998, 07/11/2018   Tdap 01/08/2008   Zoster Recombinat (Shingrix) 05/05/2021   Zoster, Live 01/17/2012    Conditions to be addressed/monitored: CHF  Care Plan : PHARMD MEDICATION MANAGEMENT  Updates made by Lavera Guise, [place] since 07/28/2021 12:00 AM     Problem: DISEASE PROGRESSION PREVENTION      Long-Range Goal: CHF   This Visit's Progress: On track  Priority: High  Note:   Current Barriers:  Unable to independently afford treatment regimen  Pharmacist Clinical Goal(s):  Over the next 90 days, patient will verbalize ability to afford treatment regimen through collaboration with PharmD and provider.   Interventions: 1:1 collaboration with Dettinger, Fransisca Kaufmann, MD regarding development and update of comprehensive plan of care as evidenced by provider attestation and co-signature Inter-disciplinary care team collaboration (see longitudinal plan  of care) Comprehensive medication review performed; medication list updated in electronic medical record  Heart Failure,  Appropriately managed by cardiology/pcp; current treatment: ARB, BB, new start jardiance Chronic combined systolic and diastolic heart failure: EF 40 to 45% after CVA in January 2021.  Echocardiogram on 04/12/2021 showed LVEF 45 to 50% Assessed patient finances. Will enroll in BI cares patient assistance for jardiance--tolerating samples well   Patient Goals/Self-Care Activities Over the next 90 days, patient will:  - take medications as prescribed collaborate with provider on medication access solutions  Follow Up Plan: Telephone follow up appointment with care management team member scheduled for: 4 weeks       Medication Assistance: Application for BI CARES (JARDIANCE)  medication assistance program. in process.  Anticipated assistance start date TBD.  See plan of care for additional detail.  Patient's  preferred pharmacy is:  [facility] [address] [place] [address] Phone: [phone] Fax: [phone]  [facility] [place], [address] [place] [address] Phone: [phone] Fax: [facility] - [place], [place] [address] Phone: [phone] Fax: [phone]  Follow Up:  Patient agrees to Care Plan and Follow-up.  Plan: Telephone follow up appointment with care management team member scheduled for:  4 WEEKS    Regina Eck, PharmD, BCPS Clinical Pharmacist, [place]  II Phone [phone]

## 2021-07-28 DIAGNOSIS — I5042 Chronic combined systolic (congestive) and diastolic (congestive) heart failure: Secondary | ICD-10-CM | POA: Insufficient documentation

## 2021-07-28 NOTE — Patient Instructions (Signed)
Visit Information  PATIENT GOALS:  Goals Addressed               This Visit's Progress     Patient Stated     CHF PHARMD (pt-stated)        Current Barriers:  Unable to independently afford treatment regimen  Pharmacist Clinical Goal(s):  Over the next 90 days, patient will verbalize ability to afford treatment regimen through collaboration with PharmD and provider.   Interventions: 1:1 collaboration with Dettinger, Fransisca Kaufmann, MD regarding development and update of comprehensive plan of care as evidenced by provider attestation and co-signature Inter-disciplinary care team collaboration (see longitudinal plan of care) Comprehensive medication review performed; medication list updated in electronic medical record  Heart Failure,  Appropriately managed by cardiology/pcp; current treatment: ARB, BB, new start jardiance Chronic combined systolic and diastolic heart failure: EF 40 to 45% after CVA in January 2021.  Echocardiogram on 04/12/2021 showed LVEF 45 to 50% Assessed patient finances. Will enroll in BI cares patient assistance for jardiance--tolerating samples well   Patient Goals/Self-Care Activities Over the next 90 days, patient will:  - take medications as prescribed collaborate with provider on medication access solutions  Follow Up Plan: Telephone follow up appointment with care management team member scheduled for: 4 weeks          The patient verbalized understanding of instructions, educational materials, and care plan provided today and declined offer to receive copy of patient instructions, educational materials, and care plan.   Telephone follow up appointment with care management team member scheduled for: 4 weeks   Signature Regina Eck, PharmD, BCPS Clinical Pharmacist, [place]  II Phone [phone]

## 2021-08-02 ENCOUNTER — Telehealth: Payer: Self-pay | Admitting: Neurology
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
## 2021-08-02 NOTE — Progress Notes (Signed)
Received notification from [employer]) regarding approval for JARDIANCE '10MG'$ . Patient assistance approved until 11/26/21.  Medication was delivered to patients home 07/30/21 for a 3 month supply  Phone: [phone]

## 2021-08-03 NOTE — Progress Notes (Signed)
Ok sounds good

## 2021-08-04 DIAGNOSIS — H5203 Hypermetropia, bilateral: Secondary | ICD-10-CM | POA: Diagnosis not present

## 2021-08-04 DIAGNOSIS — H524 Presbyopia: Secondary | ICD-10-CM | POA: Diagnosis not present

## 2021-08-04 DIAGNOSIS — H353112 Nonexudative age-related macular degeneration, right eye, intermediate dry stage: Secondary | ICD-10-CM | POA: Diagnosis not present

## 2021-08-04 DIAGNOSIS — H16223 Keratoconjunctivitis sicca, not specified as Sjogren's, bilateral: Secondary | ICD-10-CM | POA: Diagnosis not present

## 2021-08-04 DIAGNOSIS — H04123 Dry eye syndrome of bilateral lacrimal glands: Secondary | ICD-10-CM | POA: Diagnosis not present

## 2021-08-04 DIAGNOSIS — H52223 Regular astigmatism, bilateral: Secondary | ICD-10-CM | POA: Diagnosis not present

## 2021-08-04 DIAGNOSIS — Z961 Presence of intraocular lens: Secondary | ICD-10-CM | POA: Diagnosis not present

## 2021-08-04 DIAGNOSIS — H353132 Nonexudative age-related macular degeneration, bilateral, intermediate dry stage: Secondary | ICD-10-CM | POA: Diagnosis not present

## 2021-08-04 DIAGNOSIS — H26493 Other secondary cataract, bilateral: Secondary | ICD-10-CM | POA: Diagnosis not present

## 2021-08-25 ENCOUNTER — Ambulatory Visit: Payer: Medicare HMO | Admitting: Neurology

## 2021-08-29 ENCOUNTER — Ambulatory Visit: Payer: Medicare HMO | Admitting: Neurology

## 2021-08-29 ENCOUNTER — Encounter: Payer: Self-pay | Admitting: Neurology

## 2021-08-29 VITALS — BP 121/78 | HR 74 | Ht 70.0 in | Wt 250.0 lb

## 2021-08-29 DIAGNOSIS — R269 Unspecified abnormalities of gait and mobility: Secondary | ICD-10-CM | POA: Diagnosis not present

## 2021-08-29 DIAGNOSIS — R413 Other amnesia: Secondary | ICD-10-CM

## 2021-08-29 DIAGNOSIS — I63312 Cerebral infarction due to thrombosis of left middle cerebral artery: Secondary | ICD-10-CM

## 2021-08-29 NOTE — Progress Notes (Signed)
Chief Complaint  Patient presents with   Follow-up    New rm, with wife, states he has not improved much,c/o SOB, and balance issues     HISTORICAL  Craig Hurley is a 75 year old male, accompanied by his wife, seen in request by his primary care physician Dr. Warrick Parisian, Fransisca Kaufmann, for evaluation of stroke, initial evaluation was on December 09, 2020.  I reviewed and summarized the referring note.PMHx. Obesity HLD COPD On Xarelto, following his stroke on Dec 25 2019.  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 with extension into the left internal carotid artery with high-grade short segment stenosis,  Carotid Doppler study January 01, 2020 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, but otherwise unremarkable  He was seen by neuro interventional radiologist, no intervention was given  Echocardiogram with bubble study ejection fraction 40 to 45%, no cardiac etiology, EKG showed normal sinus rhythm  Hospital course was complicated by acute hypoxic respiratory failure due to COVID-19, improved with SPO2 95% on 4 L nasal cannula, history of COPD, chest x-ray on February 6 showed persistent bilateral multifocal infiltration, elevated liver function that was due to [place], was treated with Decadron 10 mg daily and remdesivir,  His aphasia only lasted for couple days, but required prolonged  rehabilitation for right-sided weakness, wife reported that he is almost back to his baseline, he already had some memory loss, word finding difficulties, slightly worse since his stroke, still able to fairly independent at his daily activity, drives short distance  He continued to take Xarelto, which was given following his stroke on December 25, 2019, his primary care physician and patient are questioning whether he should stay on anticoagulation, he was not on any antiplatelet agent prior to his stroke  Since stroke, he also complains frequent left parietal region headaches, short lasting, few minutes, pressure,  I personally reviewed CT head without contrast on November 05, 2018: No acute abnormality, mild supratentorium small vessel disease Laboratory evaluations in December 2021: LDL 78, CMP creatinine 0.99, A1c 5.8, high-sensitivity C-reactive protein was elevated 5.33, ESR 69, CBC with hemoglobin of 13.1  UPDATE February 10 2021: Accompanied by his wife at today's clinical visit, he is taking aspirin 325 mg daily, no change compared to previous visit, wife reported that Craig Hurley spent most of the time sitting, continue have mild language difficulty, confusion,  We personally reviewed MRI of the brain without contrast, on December 17, 2020: Punctuated focus of restricted diffusion in the left frontal opercular adjacent to large area of encephalomalacia and gliosis, consistent with tiny acute infarction, gliosis involving left frontal parietal and posterior temporal region,  MRA of the neck showed no large vessel disease  He does has vascular risk factor of aging, sedentary lifestyle, previous smoker, hyperlipidemia, hypertension, he was treated with Xarelto upon hospital discharge following his acute stroke in January 2021, was switched to aspirin 325 mg daily  He complains of frequent  heart palpitation, especially with minimum exertion, the stroke was cortical stroke, with no significant large  vessel disease, could not rule out possibility of embolic event, will refer him to 30 days cardiac monitoring, in the meantime, continue aspirin 325 mg daily  UPDATE Aug 29 2021: He was seen by cardiologist Dr. Wilburn Cornelia on June 15, 2021, planning on loop recorder to rule out atrial fibrillation, but was denied by [facility], had 2 weeks cardiac monitoring, has pending appointment on November 28, minimum heart rate of 41 bpm, maximum 160, average 71, predominant sinus rhythm, bundle branch block was present, 2 ventricular tachycardia runs occurred, longest lasting 6 beats,  There was no passing out spells, slow worsening memory loss, remained on aspirin 325 mg daily   REVIEW OF SYSTEMS: Full 14 system review of systems performed and notable only for as above All other review of systems were negative.  ALLERGIES: Allergies  Allergen Reactions   Penicillins Swelling    HOME MEDICATIONS: Current Outpatient Medications  Medication Sig Dispense Refill   acetaminophen (TYLENOL) 325 MG tablet Take 650 mg by mouth every 6 (six) hours as needed.     albuterol (VENTOLIN HFA) 108 (90 Base) MCG/ACT inhaler Inhale 2 puffs into the lungs every 4 (four) hours as needed for wheezing or shortness of breath. 18 g 0   aspirin EC 325 MG tablet Take 1 tablet (325 mg total) by mouth daily. 90 tablet 4   atorvastatin (LIPITOR) 10 MG tablet Take 1 tablet (10 mg total) by mouth daily. 90 tablet 1   budesonide-formoterol (SYMBICORT) 160-4.5 MCG/ACT inhaler Inhale 2 puffs into the lungs 2 (two) times daily. 3 each 11   carvedilol (COREG) 3.125 MG tablet Take 1 tablet (3.125 mg total) by mouth 2 (two) times daily. 180 tablet 3   cetirizine (ZYRTEC) 10 MG tablet Take 1 tablet (10 mg total) by mouth daily. 90 tablet 5   Cyanocobalamin (B-12 PO) Take 1,000 mcg by mouth daily.     doxazosin (CARDURA) 4 MG tablet Take 1 tablet (4 mg total) by mouth daily. 90 tablet 1   empagliflozin (JARDIANCE) 10 MG TABS tablet  Take 1 tablet (10 mg total) by mouth daily before breakfast. 90 tablet 3   losartan (COZAAR) 25 MG tablet Take 1 tablet (25 mg total) by mouth daily. 90 tablet 3   meloxicam (MOBIC) 15 MG tablet Take 1 tablet (15 mg total) by mouth daily. 90 tablet 1   montelukast (SINGULAIR) 10 MG tablet Take 1 tablet (10 mg total) by mouth at bedtime. 90 tablet 1   Multiple Vitamins-Minerals (PRESERVISION AREDS 2 PO) Take 1 capsule by mouth 2 (two) times daily.     omeprazole (PRILOSEC) 20 MG capsule Take 1 capsule (20 mg total) by mouth 2 (two) times daily before a meal. TAKE 1 CAPSULE BY MOUTH TWICE DAILY BEFORE MEAL(S) 180 capsule 1   Tiotropium Bromide Monohydrate (SPIRIVA RESPIMAT) 2.5 MCG/ACT AERS Inhale 2 puffs into the lungs daily.     vitamin A 8000 UNIT capsule Take 8,000 Units by mouth daily.     No current facility-administered medications for this visit.    PAST MEDICAL HISTORY: Past Medical History:  Diagnosis Date   Allergy    Arthritis    Asthma    Cataract    Headache    Stroke [facility])     PAST SURGICAL HISTORY: Past Surgical History:  Procedure Laterality Date   BACK SURGERY  2005   lumbar   EYE SURGERY  cataracts   FRACTURE SURGERY Right    SHOULDER ARTHROSCOPY Right    SKIN GRAFT FULL THICKNESS TRUNK     VASECTOMY      FAMILY HISTORY: Family History  Adopted: Yes  Problem Relation Age of Onset   Colon cancer Mother    Cirrhosis Father     SOCIAL HISTORY: Social History   Socioeconomic History   Marital status: Married    Spouse name: Blanch Media   Number of children: 2   Years of education: 12   Highest education level: GED or equivalent  Occupational History   Occupation: Retired  Tobacco Use   Smoking status: Former    Types: Cigars   Smokeless tobacco: Current    Types: Snuff  Vaping Use   Vaping Use: Never used  Substance and Sexual Activity   Alcohol use: No   Drug use: No   Sexual activity: Yes  Other Topics Concern   Not on file  Social  History Narrative   Lives at home with his wife.   Right-handed.   12 cups coffee day.   Social Determinants of Health   Financial Resource Strain: Low Risk    Difficulty of Paying Living Expenses: Not hard at all  Food Insecurity: No Food Insecurity   Worried About Charity fundraiser in the Last Year: Never true   [place] in the Last Year: Never true  Transportation Needs: No Transportation Needs   Lack of Transportation (Medical): No   Lack of Transportation (Non-Medical): No  Physical Activity: Insufficiently Active   Days of Exercise per Week: 7 days   Minutes of Exercise per Session: 10 min  Stress: No Stress Concern Present   Feeling of Stress : Not at all  Social Connections: Moderately Isolated   Frequency of Communication with Friends and Family: More than three times a week   Frequency of Social Gatherings with Friends and Family: Once a week   Attends Religious Services: Never   Marine scientist or Organizations: No   Attends Music therapist: Never   Marital Status: Married  Human resources officer Violence: Not At Risk   Fear of Current or Ex-Partner: No   Emotionally Abused: No   Physically Abused: No   Sexually Abused: No     PHYSICAL EXAM   Vitals:   08/29/21 1059  BP: 121/78  Pulse: 74  Weight: 250 lb (113.4 kg)  Height: 5' 10"  (1.778 m)   Not recorded     Body mass index is 35.87 kg/m.  PHYSICAL EXAMNIATION:  Gen: NAD, conversant, well nourised, well groomed                   NEUROLOGICAL EXAM:  MENTAL STATUS: Speech/cognition: Awake, alert, oriented to history taking and casual conversation, Mild word finding, naming and comprehensive difficulties.    CRANIAL NERVES: CN II: Visual fields are full to confrontation. Pupils are round equal and briskly reactive to light. CN III, IV, VI: extraocular movement are normal. No ptosis. CN V: Facial sensation is intact to light touch CN VII: Face is symmetric with normal eye  closure  CN VIII: Hearing is normal to causal conversation. CN IX, X: Phonation is normal. CN XI: Head turning and shoulder shrug are intact  MOTOR: There is no pronator drift of out-stretched arms. Muscle bulk and tone are normal. Muscle strength is normal.  REFLEXES: Reflexes are 1 and symmetric at the biceps, triceps, knees, and ankles. Plantar responses are  flexor.  SENSORY: Intact to light touch, pinprick and vibratory sensation are intact in fingers and toes.  COORDINATION: There is no trunk or limb dysmetria noted.  GAIT/STANCE: He needs to get up from seated position, steady, but limited because of his body habitus Romberg is absent.   DIAGNOSTIC DATA (LABS, IMAGING, TESTING) - I reviewed patient records, labs, notes, testing and imaging myself where available.   ASSESSMENT AND PLAN  Lashun Mccants is a 75 y.o. 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, 2019 Expressive more than comprehensive aphasia  Received IV tPA at the local hospital, later was cared at [facility], no cardiac arrhythmia, or embolic source found, he was put on Xarelto, in the setting of COVID-pneumonia,  Repeat MRI of the brain in January 2022 showed large sized left MCA cortical stroke, with superimposed tiny acute stroke, MRA of neck showed no large vessel disease,  He is now on aspirin 325 mg daily, does has multiple vascular risk factor of aging, hypertension, hyperlipidemia, sedentary lifestyle, previous smoker,  He also complains of frequent heart palpitation, shortness of breath with minimum exertion, cortical stroke, in the setting of known large vessel disease, cannot rule out the possibility of embolic event,  Was seen by cardiologist for cardiac monitoring, formal report pending   Continue aspirin 325 mg daily now  Emphasized importance of moderate exercise, increase water intake Gait abnormality  Home physical therapy  Marcial Pacas, M.D. Ph.D.  [facility] [address], [place], [place] [address] Ph: [phone] Fax: [phone]  CC:  Dettinger, Fransisca Kaufmann, MD [address] [place],  [place] [address]

## 2021-08-30 ENCOUNTER — Telehealth: Payer: Self-pay | Admitting: Neurology

## 2021-08-30 NOTE — Telephone Encounter (Signed)
Home health referral accepted by [facility]. They will call patient to start care.

## 2021-09-01 ENCOUNTER — Telehealth: Payer: Self-pay | Admitting: Neurology

## 2021-09-01 DIAGNOSIS — E785 Hyperlipidemia, unspecified: Secondary | ICD-10-CM | POA: Diagnosis not present

## 2021-09-01 DIAGNOSIS — E669 Obesity, unspecified: Secondary | ICD-10-CM | POA: Diagnosis not present

## 2021-09-01 DIAGNOSIS — R002 Palpitations: Secondary | ICD-10-CM | POA: Diagnosis not present

## 2021-09-01 DIAGNOSIS — M1990 Unspecified osteoarthritis, unspecified site: Secondary | ICD-10-CM | POA: Diagnosis not present

## 2021-09-01 DIAGNOSIS — J449 Chronic obstructive pulmonary disease, unspecified: Secondary | ICD-10-CM | POA: Diagnosis not present

## 2021-09-01 DIAGNOSIS — R2689 Other abnormalities of gait and mobility: Secondary | ICD-10-CM | POA: Diagnosis not present

## 2021-09-01 DIAGNOSIS — I69311 Memory deficit following cerebral infarction: Secondary | ICD-10-CM | POA: Diagnosis not present

## 2021-09-01 DIAGNOSIS — I69398 Other sequelae of cerebral infarction: Secondary | ICD-10-CM | POA: Diagnosis not present

## 2021-09-01 DIAGNOSIS — R519 Headache, unspecified: Secondary | ICD-10-CM | POA: Diagnosis not present

## 2021-09-01 NOTE — Telephone Encounter (Signed)
I returned the call to [facility]. He will proceed w/ the orders below.

## 2021-09-01 NOTE — Telephone Encounter (Signed)
Hilliard Clark from [facility] called stating that he saw the pt today for a [place] and is requsting VO for:  1x 1 week    2x 3weeks    1x 4 weeks

## 2021-09-06 DIAGNOSIS — M1990 Unspecified osteoarthritis, unspecified site: Secondary | ICD-10-CM | POA: Diagnosis not present

## 2021-09-06 DIAGNOSIS — E669 Obesity, unspecified: Secondary | ICD-10-CM | POA: Diagnosis not present

## 2021-09-06 DIAGNOSIS — J449 Chronic obstructive pulmonary disease, unspecified: Secondary | ICD-10-CM | POA: Diagnosis not present

## 2021-09-06 DIAGNOSIS — I69398 Other sequelae of cerebral infarction: Secondary | ICD-10-CM | POA: Diagnosis not present

## 2021-09-06 DIAGNOSIS — E785 Hyperlipidemia, unspecified: Secondary | ICD-10-CM | POA: Diagnosis not present

## 2021-09-06 DIAGNOSIS — R2689 Other abnormalities of gait and mobility: Secondary | ICD-10-CM | POA: Diagnosis not present

## 2021-09-06 DIAGNOSIS — I69311 Memory deficit following cerebral infarction: Secondary | ICD-10-CM | POA: Diagnosis not present

## 2021-09-06 DIAGNOSIS — R519 Headache, unspecified: Secondary | ICD-10-CM | POA: Diagnosis not present

## 2021-09-06 DIAGNOSIS — R002 Palpitations: Secondary | ICD-10-CM | POA: Diagnosis not present

## 2021-09-08 DIAGNOSIS — E669 Obesity, unspecified: Secondary | ICD-10-CM | POA: Diagnosis not present

## 2021-09-08 DIAGNOSIS — R002 Palpitations: Secondary | ICD-10-CM | POA: Diagnosis not present

## 2021-09-08 DIAGNOSIS — R519 Headache, unspecified: Secondary | ICD-10-CM | POA: Diagnosis not present

## 2021-09-08 DIAGNOSIS — R2689 Other abnormalities of gait and mobility: Secondary | ICD-10-CM | POA: Diagnosis not present

## 2021-09-08 DIAGNOSIS — I69398 Other sequelae of cerebral infarction: Secondary | ICD-10-CM | POA: Diagnosis not present

## 2021-09-08 DIAGNOSIS — E785 Hyperlipidemia, unspecified: Secondary | ICD-10-CM | POA: Diagnosis not present

## 2021-09-08 DIAGNOSIS — M1990 Unspecified osteoarthritis, unspecified site: Secondary | ICD-10-CM | POA: Diagnosis not present

## 2021-09-08 DIAGNOSIS — I69311 Memory deficit following cerebral infarction: Secondary | ICD-10-CM | POA: Diagnosis not present

## 2021-09-08 DIAGNOSIS — J449 Chronic obstructive pulmonary disease, unspecified: Secondary | ICD-10-CM | POA: Diagnosis not present

## 2021-09-12 ENCOUNTER — Encounter: Payer: Self-pay | Admitting: Family Medicine

## 2021-09-12 ENCOUNTER — Telehealth: Payer: Self-pay | Admitting: Family Medicine

## 2021-09-12 DIAGNOSIS — I69311 Memory deficit following cerebral infarction: Secondary | ICD-10-CM | POA: Diagnosis not present

## 2021-09-12 DIAGNOSIS — E669 Obesity, unspecified: Secondary | ICD-10-CM | POA: Diagnosis not present

## 2021-09-12 DIAGNOSIS — R2689 Other abnormalities of gait and mobility: Secondary | ICD-10-CM | POA: Diagnosis not present

## 2021-09-12 DIAGNOSIS — R519 Headache, unspecified: Secondary | ICD-10-CM | POA: Diagnosis not present

## 2021-09-12 DIAGNOSIS — R002 Palpitations: Secondary | ICD-10-CM | POA: Diagnosis not present

## 2021-09-12 DIAGNOSIS — I69398 Other sequelae of cerebral infarction: Secondary | ICD-10-CM | POA: Diagnosis not present

## 2021-09-12 DIAGNOSIS — M1990 Unspecified osteoarthritis, unspecified site: Secondary | ICD-10-CM | POA: Diagnosis not present

## 2021-09-12 DIAGNOSIS — J449 Chronic obstructive pulmonary disease, unspecified: Secondary | ICD-10-CM | POA: Diagnosis not present

## 2021-09-12 DIAGNOSIS — E785 Hyperlipidemia, unspecified: Secondary | ICD-10-CM | POA: Diagnosis not present

## 2021-09-12 NOTE — Telephone Encounter (Signed)
Yes that should be fine, enough time between injections.  2 weeks is the ideal time between

## 2021-09-12 NOTE — Telephone Encounter (Signed)
Patient's wife aware.  Changed appointment for Friday to flu shot only and scheduled Shingrix vaccine for 09/30/21.

## 2021-09-14 DIAGNOSIS — R2689 Other abnormalities of gait and mobility: Secondary | ICD-10-CM | POA: Diagnosis not present

## 2021-09-14 DIAGNOSIS — R002 Palpitations: Secondary | ICD-10-CM | POA: Diagnosis not present

## 2021-09-14 DIAGNOSIS — E669 Obesity, unspecified: Secondary | ICD-10-CM | POA: Diagnosis not present

## 2021-09-14 DIAGNOSIS — J449 Chronic obstructive pulmonary disease, unspecified: Secondary | ICD-10-CM | POA: Diagnosis not present

## 2021-09-14 DIAGNOSIS — I69311 Memory deficit following cerebral infarction: Secondary | ICD-10-CM | POA: Diagnosis not present

## 2021-09-14 DIAGNOSIS — M1990 Unspecified osteoarthritis, unspecified site: Secondary | ICD-10-CM | POA: Diagnosis not present

## 2021-09-14 DIAGNOSIS — R519 Headache, unspecified: Secondary | ICD-10-CM | POA: Diagnosis not present

## 2021-09-14 DIAGNOSIS — I69398 Other sequelae of cerebral infarction: Secondary | ICD-10-CM | POA: Diagnosis not present

## 2021-09-14 DIAGNOSIS — E785 Hyperlipidemia, unspecified: Secondary | ICD-10-CM | POA: Diagnosis not present

## 2021-09-16 ENCOUNTER — Ambulatory Visit (INDEPENDENT_AMBULATORY_CARE_PROVIDER_SITE_OTHER): Payer: Medicare HMO | Admitting: *Deleted

## 2021-09-16 ENCOUNTER — Other Ambulatory Visit: Payer: Self-pay

## 2021-09-16 DIAGNOSIS — Z23 Encounter for immunization: Secondary | ICD-10-CM

## 2021-09-19 DIAGNOSIS — E785 Hyperlipidemia, unspecified: Secondary | ICD-10-CM | POA: Diagnosis not present

## 2021-09-19 DIAGNOSIS — I69398 Other sequelae of cerebral infarction: Secondary | ICD-10-CM | POA: Diagnosis not present

## 2021-09-19 DIAGNOSIS — J449 Chronic obstructive pulmonary disease, unspecified: Secondary | ICD-10-CM | POA: Diagnosis not present

## 2021-09-19 DIAGNOSIS — R2689 Other abnormalities of gait and mobility: Secondary | ICD-10-CM | POA: Diagnosis not present

## 2021-09-19 DIAGNOSIS — R519 Headache, unspecified: Secondary | ICD-10-CM | POA: Diagnosis not present

## 2021-09-19 DIAGNOSIS — M1990 Unspecified osteoarthritis, unspecified site: Secondary | ICD-10-CM | POA: Diagnosis not present

## 2021-09-19 DIAGNOSIS — R002 Palpitations: Secondary | ICD-10-CM | POA: Diagnosis not present

## 2021-09-19 DIAGNOSIS — E669 Obesity, unspecified: Secondary | ICD-10-CM | POA: Diagnosis not present

## 2021-09-19 DIAGNOSIS — I69311 Memory deficit following cerebral infarction: Secondary | ICD-10-CM | POA: Diagnosis not present

## 2021-09-21 DIAGNOSIS — R2689 Other abnormalities of gait and mobility: Secondary | ICD-10-CM | POA: Diagnosis not present

## 2021-09-21 DIAGNOSIS — R519 Headache, unspecified: Secondary | ICD-10-CM | POA: Diagnosis not present

## 2021-09-21 DIAGNOSIS — J449 Chronic obstructive pulmonary disease, unspecified: Secondary | ICD-10-CM | POA: Diagnosis not present

## 2021-09-21 DIAGNOSIS — M1990 Unspecified osteoarthritis, unspecified site: Secondary | ICD-10-CM | POA: Diagnosis not present

## 2021-09-21 DIAGNOSIS — E785 Hyperlipidemia, unspecified: Secondary | ICD-10-CM | POA: Diagnosis not present

## 2021-09-21 DIAGNOSIS — I69398 Other sequelae of cerebral infarction: Secondary | ICD-10-CM | POA: Diagnosis not present

## 2021-09-21 DIAGNOSIS — R002 Palpitations: Secondary | ICD-10-CM | POA: Diagnosis not present

## 2021-09-21 DIAGNOSIS — I69311 Memory deficit following cerebral infarction: Secondary | ICD-10-CM | POA: Diagnosis not present

## 2021-09-21 DIAGNOSIS — E669 Obesity, unspecified: Secondary | ICD-10-CM | POA: Diagnosis not present

## 2021-09-26 DIAGNOSIS — E669 Obesity, unspecified: Secondary | ICD-10-CM | POA: Diagnosis not present

## 2021-09-26 DIAGNOSIS — E785 Hyperlipidemia, unspecified: Secondary | ICD-10-CM | POA: Diagnosis not present

## 2021-09-26 DIAGNOSIS — I69398 Other sequelae of cerebral infarction: Secondary | ICD-10-CM | POA: Diagnosis not present

## 2021-09-26 DIAGNOSIS — I69311 Memory deficit following cerebral infarction: Secondary | ICD-10-CM | POA: Diagnosis not present

## 2021-09-26 DIAGNOSIS — R519 Headache, unspecified: Secondary | ICD-10-CM | POA: Diagnosis not present

## 2021-09-26 DIAGNOSIS — J449 Chronic obstructive pulmonary disease, unspecified: Secondary | ICD-10-CM | POA: Diagnosis not present

## 2021-09-26 DIAGNOSIS — R2689 Other abnormalities of gait and mobility: Secondary | ICD-10-CM | POA: Diagnosis not present

## 2021-09-26 DIAGNOSIS — R002 Palpitations: Secondary | ICD-10-CM | POA: Diagnosis not present

## 2021-09-26 DIAGNOSIS — M1990 Unspecified osteoarthritis, unspecified site: Secondary | ICD-10-CM | POA: Diagnosis not present

## 2021-09-28 ENCOUNTER — Ambulatory Visit (INDEPENDENT_AMBULATORY_CARE_PROVIDER_SITE_OTHER): Payer: Medicare HMO

## 2021-09-28 ENCOUNTER — Encounter: Payer: Self-pay | Admitting: Family Medicine

## 2021-09-28 ENCOUNTER — Other Ambulatory Visit: Payer: Self-pay

## 2021-09-28 ENCOUNTER — Ambulatory Visit (INDEPENDENT_AMBULATORY_CARE_PROVIDER_SITE_OTHER): Payer: Medicare HMO | Admitting: Family Medicine

## 2021-09-28 VITALS — BP 112/81 | HR 78 | Ht 70.0 in | Wt 251.0 lb

## 2021-09-28 DIAGNOSIS — M25561 Pain in right knee: Secondary | ICD-10-CM

## 2021-09-28 DIAGNOSIS — G8929 Other chronic pain: Secondary | ICD-10-CM

## 2021-09-28 DIAGNOSIS — S8991XA Unspecified injury of right lower leg, initial encounter: Secondary | ICD-10-CM | POA: Diagnosis not present

## 2021-09-28 IMAGING — DX DG KNEE 1-2V*R*
3 series · 3 of 3 positions shown · non-contrast
Comparison: Knee x-rays [DATE].

CLINICAL DATA: Pain.

EXAM:
RIGHT KNEE - 1-2 VIEW

[knee ap (1 of 2)]
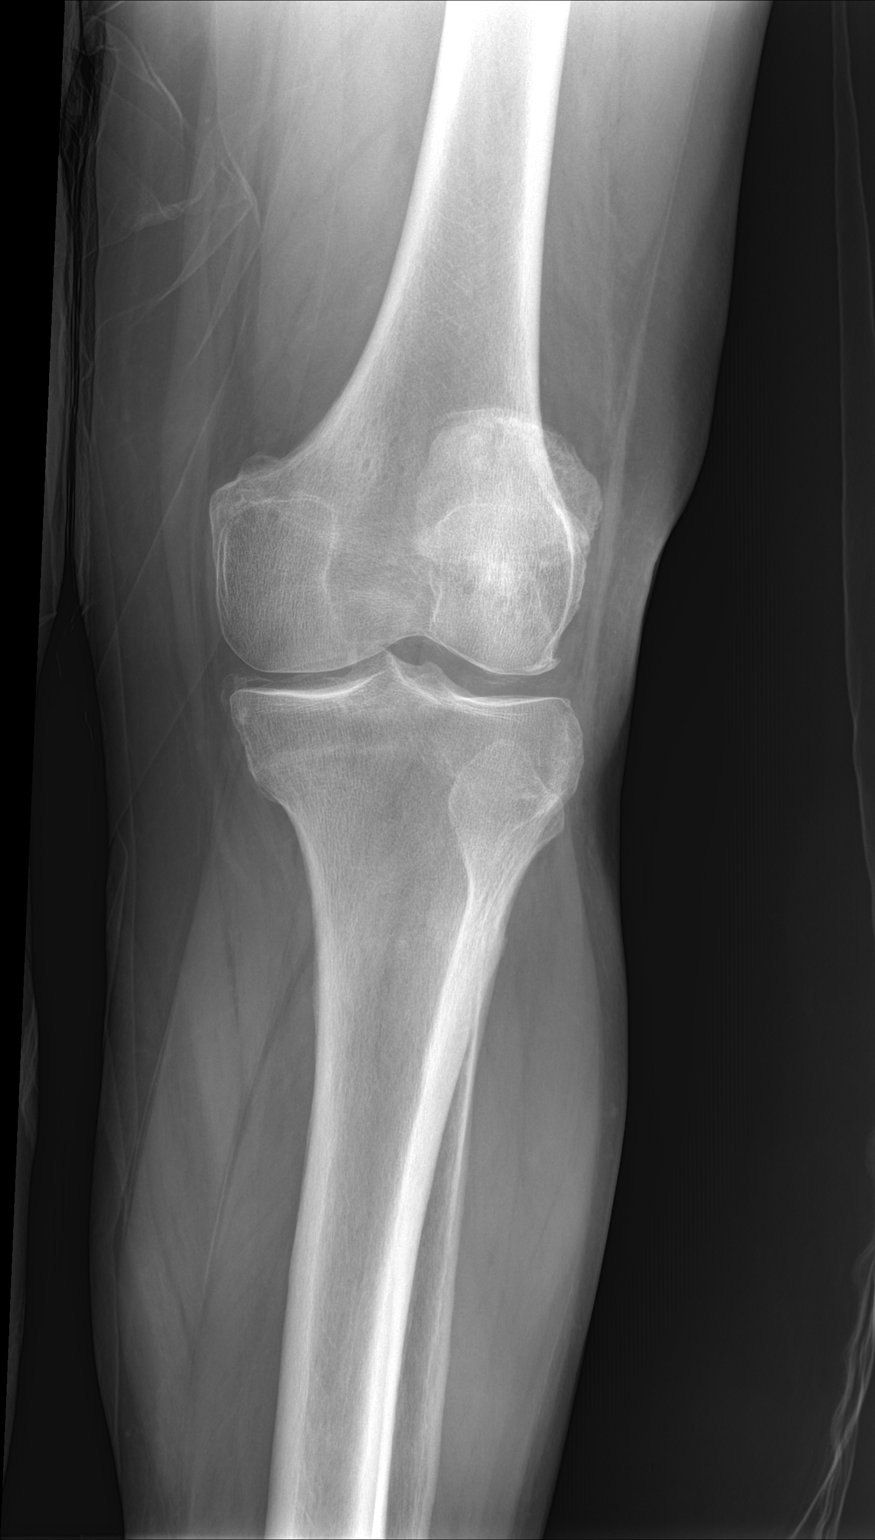

[knee lat]
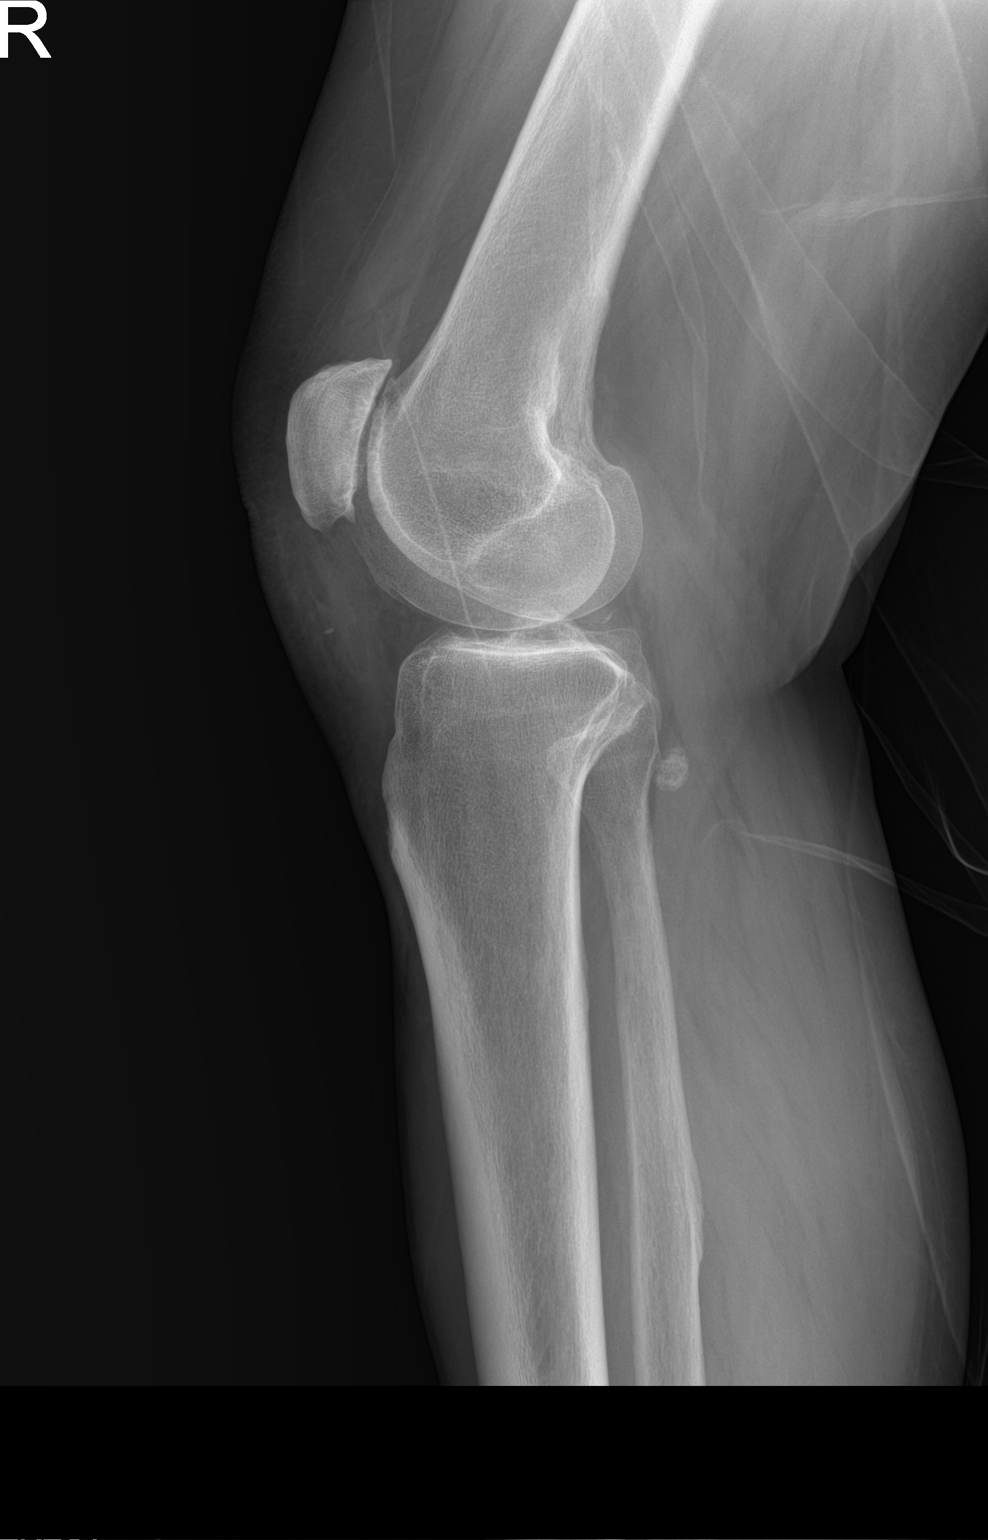

[knee ap (2 of 2)]
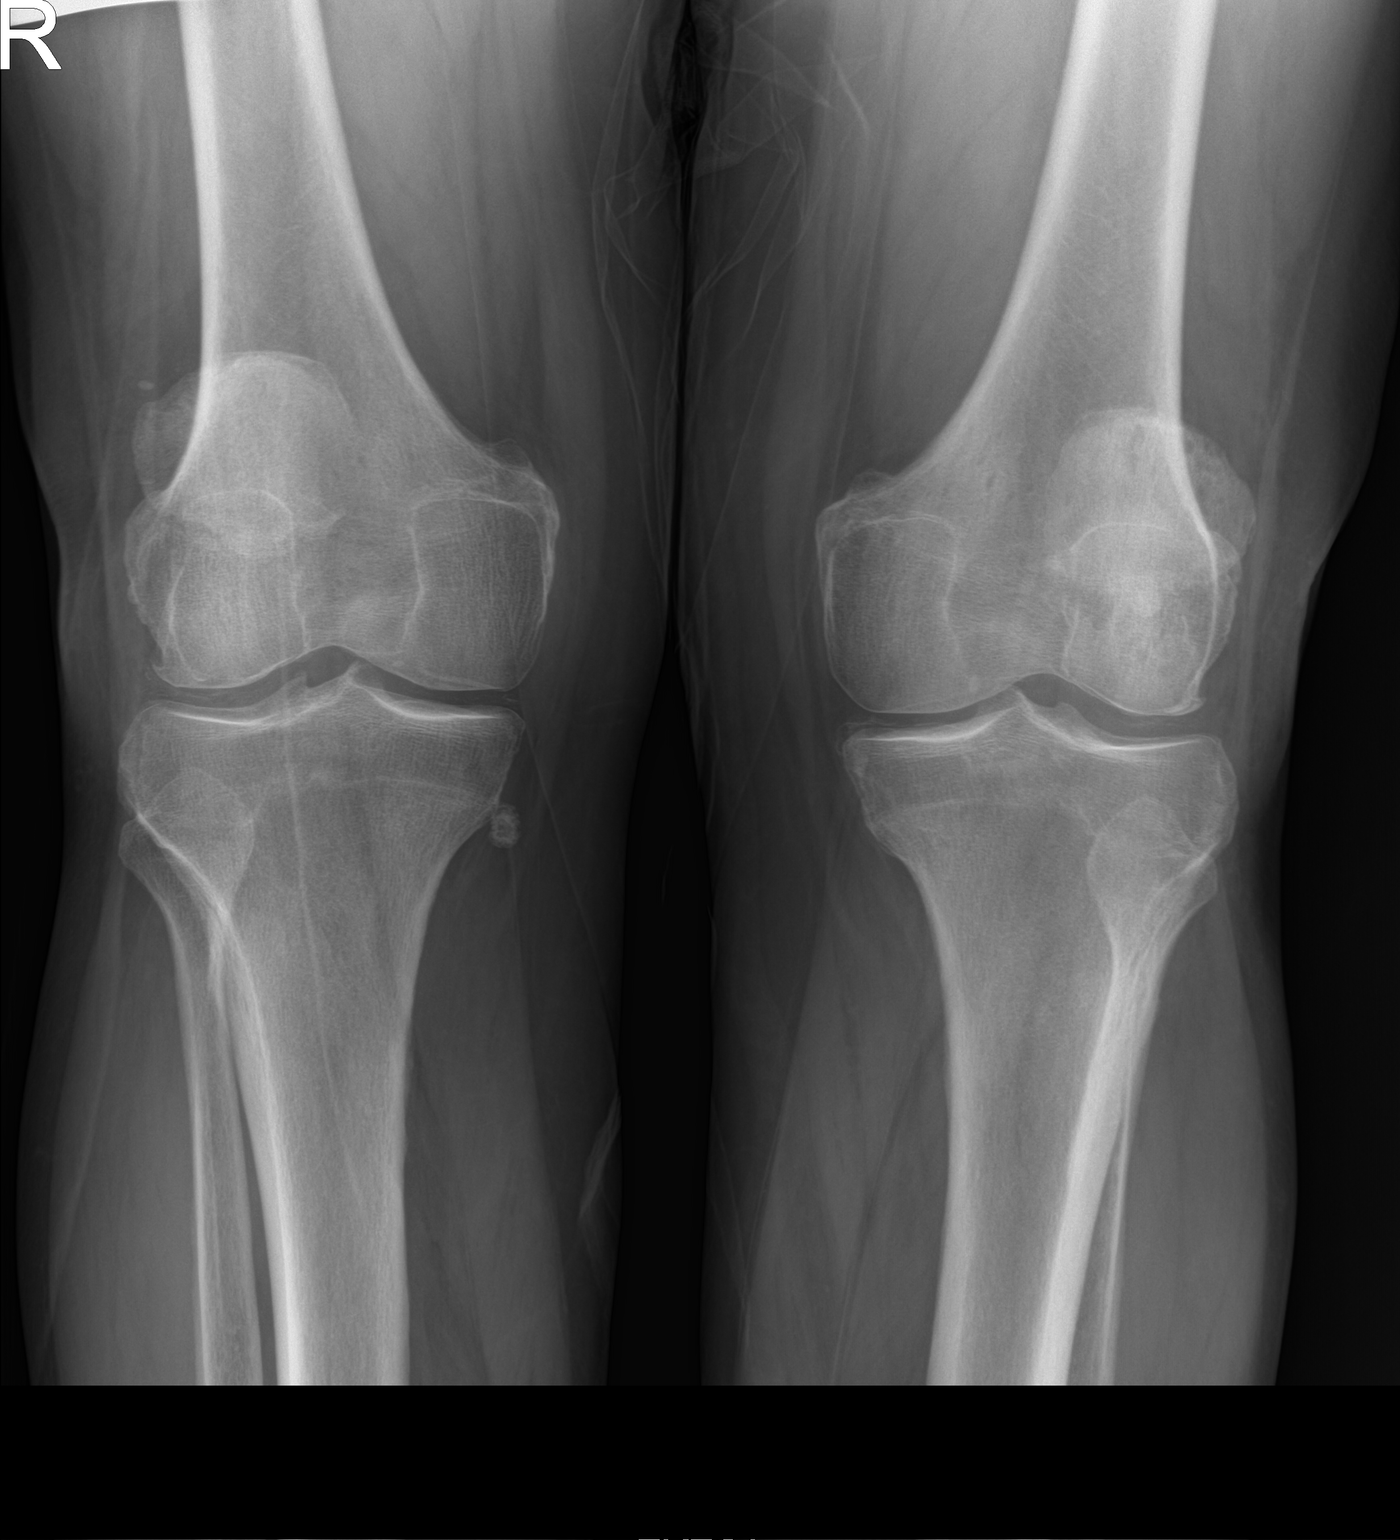

[3 of 3 positions shown; findings below may reference images not displayed]

FINDINGS: There is no evidence for acute fracture or dislocation. There is
mild patellofemoral compartment joint space narrowing. There is
tricompartmental osteophyte formation with spurring of the tibial
spines, progressed compared to the prior study. There is
mediolateral compartment chondrocalcinosis which is unchanged.
Rounded 9 mm well corticated density adjacent to the medial aspect
of the medial tibial plateau appears unchanged. There is no
significant joint effusion. There is some thickening and haziness in
the region of the patellar tendon.

Limited evaluation of the left knee demonstrates mild degenerative
change.
IMPRESSION: 1. Mild to moderate tricompartmental osteoarthrosis of the right
knee.
2. Thickening and haziness in the region the patellar tendon.
Correlate clinically for acute injury at this level or tendinitis.
No joint effusion.

## 2021-09-28 MED ORDER — DICLOFENAC SODIUM 1 % EX GEL: 2.0000 g | Freq: Four times a day (QID) | CUTANEOUS | 3 refills | Status: DC

## 2021-09-28 NOTE — Progress Notes (Signed)
BP 112/81   Pulse 78   Ht 5\' 10"  (1.778 m)   Wt 251 lb (113.9 kg)   SpO2 97%   BMI 36.01 kg/m    Subjective:   [name], male    DOB: 1946/10/13, 75 y.o.   MRN: [id number]  HPI: Craig Hurley is a 75 y.o. male presenting on 09/28/2021 for Knee Pain   HPI Right knee pain Patient is coming in complaining of right knee pain, this is something that is been bothering him for quite some time but it has recently worsened.  He says it has worsened to the point where he is coming in today.  Is been worse over the past few months.  He did have a previous history of having a meniscal repair in that knee and injections but many years ago.  He says this was not recently.  Patient does say that he gets some popping and catching in that knee.  He denies any fevers or chills or redness or warmth.  Relevant past medical, surgical, family and social history reviewed and updated as indicated. Interim medical history since our last visit reviewed. Allergies and medications reviewed and updated.  Review of Systems  Constitutional:  Negative for chills and fever.  Respiratory:  Negative for shortness of breath and wheezing.   Cardiovascular:  Negative for chest pain and leg swelling.  Musculoskeletal:  Positive for arthralgias and joint swelling. Negative for back pain, gait problem and myalgias.  Skin:  Negative for rash.  All other systems reviewed and are negative.  Per HPI unless specifically indicated above   Allergies as of 09/28/2021       Reactions   Penicillins Swelling        Medication List        Accurate as of September 28, 2021  4:46 PM. If you have any questions, ask your nurse or doctor.          acetaminophen 325 MG tablet Commonly known as: TYLENOL Take 650 mg by mouth every 6 (six) hours as needed.   albuterol 108 (90 Base) MCG/ACT inhaler Commonly known as: VENTOLIN HFA Inhale 2 puffs into the lungs every 4 (four) hours as needed for  wheezing or shortness of breath.   aspirin EC 325 MG tablet Take 1 tablet (325 mg total) by mouth daily.   atorvastatin 10 MG tablet Commonly known as: LIPITOR Take 1 tablet (10 mg total) by mouth daily.   B-12 PO Take 1,000 mcg by mouth daily.   budesonide-formoterol 160-4.5 MCG/ACT inhaler Commonly known as: SYMBICORT Inhale 2 puffs into the lungs 2 (two) times daily.   carvedilol 3.125 MG tablet Commonly known as: COREG Take 1 tablet (3.125 mg total) by mouth 2 (two) times daily.   cetirizine 10 MG tablet Commonly known as: ZYRTEC Take 1 tablet (10 mg total) by mouth daily.   diclofenac Sodium 1 % Gel Commonly known as: Voltaren Apply 2 g topically 4 (four) times daily. Started by: Worthy Rancher, MD   doxazosin 4 MG tablet Commonly known as: CARDURA Take 1 tablet (4 mg total) by mouth daily.   empagliflozin 10 MG Tabs tablet Commonly known as: Jardiance Take 1 tablet (10 mg total) by mouth daily before breakfast.   losartan 25 MG tablet Commonly known as: COZAAR Take 1 tablet (25 mg total) by mouth daily.   meloxicam 15 MG tablet Commonly known as: MOBIC Take 1 tablet (15 mg total) by mouth daily.   montelukast  10 MG tablet Commonly known as: SINGULAIR Take 1 tablet (10 mg total) by mouth at bedtime.   omeprazole 20 MG capsule Commonly known as: PRILOSEC Take 1 capsule (20 mg total) by mouth 2 (two) times daily before a meal. TAKE 1 CAPSULE BY MOUTH TWICE DAILY BEFORE MEAL(S)   PRESERVISION AREDS 2 PO Take 1 capsule by mouth 2 (two) times daily.   Spiriva Respimat 2.5 MCG/ACT Aers Generic drug: Tiotropium Bromide Monohydrate Inhale 2 puffs into the lungs daily.   vitamin A 8000 UNIT capsule Take 8,000 Units by mouth daily.         Objective:   BP 112/81   Pulse 78   Ht 5\' 10"  (1.778 m)   Wt 251 lb (113.9 kg)   SpO2 97%   BMI 36.01 kg/m   Wt Readings from Last 3 Encounters:  09/28/21 251 lb (113.9 kg)  08/29/21 250 lb (113.4 kg)   07/20/21 255 lb (115.7 kg)    Physical Exam Vitals and nursing note reviewed.  Constitutional:      General: He is not in acute distress.    Appearance: Normal appearance. He is not ill-appearing.  Musculoskeletal:     Right knee: No swelling, deformity, effusion or crepitus. Normal range of motion. Tenderness present over the medial joint line. No MCL, LCL, ACL or PCL tenderness. No LCL laxity, MCL laxity, ACL laxity or PCL laxity. Normal meniscus.  Neurological:     Mental Status: He is alert.    Right knee x-ray shows mild amount of osteoarthritis and narrowing but no significant amount.  Assessment & Plan:   Problem List Items Addressed This Visit   None Visit Diagnoses     Chronic pain of right knee    -  Primary   Relevant Medications   diclofenac Sodium (VOLTAREN) 1 % GEL   Other Relevant Orders   DG Knee 1-2 Views Right       Likely inflammation due to osteoarthritis, patient would like to try the topical Voltaren gel first but will call back if worsening for possible injections in the future. Follow up plan: Return if symptoms worsen or fail to improve.  Counseling provided for all of the vaccine components Orders Placed This Encounter  Procedures   DG Knee 1-2 Views Right    Caryl Pina, MD [facility] 09/28/2021, 4:46 PM

## 2021-09-30 ENCOUNTER — Ambulatory Visit: Payer: Medicare HMO

## 2021-10-03 DIAGNOSIS — E785 Hyperlipidemia, unspecified: Secondary | ICD-10-CM | POA: Diagnosis not present

## 2021-10-03 DIAGNOSIS — J449 Chronic obstructive pulmonary disease, unspecified: Secondary | ICD-10-CM | POA: Diagnosis not present

## 2021-10-03 DIAGNOSIS — I69398 Other sequelae of cerebral infarction: Secondary | ICD-10-CM | POA: Diagnosis not present

## 2021-10-03 DIAGNOSIS — R002 Palpitations: Secondary | ICD-10-CM | POA: Diagnosis not present

## 2021-10-03 DIAGNOSIS — R519 Headache, unspecified: Secondary | ICD-10-CM | POA: Diagnosis not present

## 2021-10-03 DIAGNOSIS — R2689 Other abnormalities of gait and mobility: Secondary | ICD-10-CM | POA: Diagnosis not present

## 2021-10-03 DIAGNOSIS — I69311 Memory deficit following cerebral infarction: Secondary | ICD-10-CM | POA: Diagnosis not present

## 2021-10-03 DIAGNOSIS — E669 Obesity, unspecified: Secondary | ICD-10-CM | POA: Diagnosis not present

## 2021-10-03 DIAGNOSIS — M1990 Unspecified osteoarthritis, unspecified site: Secondary | ICD-10-CM | POA: Diagnosis not present

## 2021-10-03 NOTE — Telephone Encounter (Signed)
Adolph Pollack with [employer] called states it is company policy to report any falls. He states pt reported a fall on last Thursday tripped on the last step going down and landed on his knee. Pt presented chronic pain and went to see his orthopedic doctor. Hilliard Clark stated everything seemed to be okay just wanted to inform Provider here at the office.

## 2021-10-10 DIAGNOSIS — R002 Palpitations: Secondary | ICD-10-CM | POA: Diagnosis not present

## 2021-10-10 DIAGNOSIS — R2689 Other abnormalities of gait and mobility: Secondary | ICD-10-CM | POA: Diagnosis not present

## 2021-10-10 DIAGNOSIS — I69311 Memory deficit following cerebral infarction: Secondary | ICD-10-CM | POA: Diagnosis not present

## 2021-10-10 DIAGNOSIS — J449 Chronic obstructive pulmonary disease, unspecified: Secondary | ICD-10-CM | POA: Diagnosis not present

## 2021-10-10 DIAGNOSIS — M1990 Unspecified osteoarthritis, unspecified site: Secondary | ICD-10-CM | POA: Diagnosis not present

## 2021-10-10 DIAGNOSIS — E669 Obesity, unspecified: Secondary | ICD-10-CM | POA: Diagnosis not present

## 2021-10-10 DIAGNOSIS — I69398 Other sequelae of cerebral infarction: Secondary | ICD-10-CM | POA: Diagnosis not present

## 2021-10-10 DIAGNOSIS — R519 Headache, unspecified: Secondary | ICD-10-CM | POA: Diagnosis not present

## 2021-10-10 DIAGNOSIS — E785 Hyperlipidemia, unspecified: Secondary | ICD-10-CM | POA: Diagnosis not present

## 2021-10-15 ENCOUNTER — Other Ambulatory Visit: Payer: Self-pay | Admitting: Family Medicine

## 2021-10-17 DIAGNOSIS — J449 Chronic obstructive pulmonary disease, unspecified: Secondary | ICD-10-CM | POA: Diagnosis not present

## 2021-10-17 DIAGNOSIS — E785 Hyperlipidemia, unspecified: Secondary | ICD-10-CM | POA: Diagnosis not present

## 2021-10-17 DIAGNOSIS — E669 Obesity, unspecified: Secondary | ICD-10-CM | POA: Diagnosis not present

## 2021-10-17 DIAGNOSIS — I69398 Other sequelae of cerebral infarction: Secondary | ICD-10-CM | POA: Diagnosis not present

## 2021-10-17 DIAGNOSIS — R519 Headache, unspecified: Secondary | ICD-10-CM | POA: Diagnosis not present

## 2021-10-17 DIAGNOSIS — M1990 Unspecified osteoarthritis, unspecified site: Secondary | ICD-10-CM | POA: Diagnosis not present

## 2021-10-17 DIAGNOSIS — R002 Palpitations: Secondary | ICD-10-CM | POA: Diagnosis not present

## 2021-10-17 DIAGNOSIS — I69311 Memory deficit following cerebral infarction: Secondary | ICD-10-CM | POA: Diagnosis not present

## 2021-10-17 DIAGNOSIS — R2689 Other abnormalities of gait and mobility: Secondary | ICD-10-CM | POA: Diagnosis not present

## 2021-10-21 ENCOUNTER — Other Ambulatory Visit: Payer: Self-pay | Admitting: Family Medicine

## 2021-10-21 DIAGNOSIS — K219 Gastro-esophageal reflux disease without esophagitis: Secondary | ICD-10-CM
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
## 2021-10-24 ENCOUNTER — Ambulatory Visit: Payer: Medicare HMO | Admitting: Cardiology

## 2021-10-24 ENCOUNTER — Encounter: Payer: Self-pay | Admitting: Cardiology

## 2021-10-24 ENCOUNTER — Other Ambulatory Visit: Payer: Self-pay

## 2021-10-24 VITALS — BP 118/72 | HR 59 | Ht 70.0 in | Wt 256.4 lb

## 2021-10-24 DIAGNOSIS — I5042 Chronic combined systolic (congestive) and diastolic (congestive) heart failure: Secondary | ICD-10-CM | POA: Diagnosis not present

## 2021-10-24 DIAGNOSIS — E785 Hyperlipidemia, unspecified: Secondary | ICD-10-CM | POA: Diagnosis not present

## 2021-10-24 DIAGNOSIS — I10 Essential (primary) hypertension: Secondary | ICD-10-CM | POA: Diagnosis not present

## 2021-10-24 DIAGNOSIS — I63312 Cerebral infarction due to thrombosis of left middle cerebral artery: Secondary | ICD-10-CM

## 2021-10-24 DIAGNOSIS — R079 Chest pain, unspecified: Secondary | ICD-10-CM

## 2021-10-24 LAB — BASIC METABOLIC PANEL
BUN/Creatinine Ratio: 13 (ref 10–24)
BUN: 13 mg/dL (ref 8–27)
CO2: 21 mmol/L (ref 20–29)
Calcium: 8.8 mg/dL (ref 8.6–10.2)
Chloride: 104 mmol/L (ref 96–106)
Creatinine, Ser: 0.99 mg/dL (ref 0.76–1.27)
Glucose: 103 mg/dL — ABNORMAL HIGH (ref 70–99)
Potassium: 4.6 mmol/L (ref 3.5–5.2)
Sodium: 143 mmol/L (ref 134–144)
eGFR: 79 mL/min/{1.73_m2} (ref >59)

## 2021-10-24 NOTE — Patient Instructions (Addendum)
Medication Instructions:  Your physician recommends that you continue on your current medications as directed. Please refer to the Current Medication list given to you today.  *If you need a refill on your cardiac medications before your next appointment, please call your pharmacy*  Lab Work: BMET today  If you have labs (blood work) drawn today and your tests are completely normal, you will receive your results only by: [place] (if you have MyChart) OR A paper copy in the mail If you have any lab test that is abnormal or we need to change your treatment, we will call you to review the results.   Testing/Procedures: Coronary CTA-see instructions below  Follow-Up: At [facility], you and your health needs are our priority.  As part of our continuing mission to provide you with exceptional heart care, we have created designated Provider Care Teams.  These Care Teams include your primary Cardiologist (physician) and Advanced Practice Providers (APPs -  Physician Assistants and Nurse Practitioners) who all work together to provide you with the care you need, when you need it.  We recommend signing up for the patient portal called "MyChart".  Sign up information is provided on this After Visit Summary.  MyChart is used to connect with patients for Virtual Visits (Telemedicine).  Patients are able to view lab/test results, encounter notes, upcoming appointments, etc.  Non-urgent messages can be sent to your provider as well.   To learn more about what you can do with MyChart, go to [email or web address].    Your next appointment:   3 month(s) with PA or NP 6 months with Dr. Gardiner Rhyme  Other Instructions   Your cardiac CT will be scheduled at one of the below locations:   [facility] [address] [place], [place] [address] [phone]  If scheduled at [facility], please arrive at the [facility] main entrance (entrance A) of [facility] 30  minutes prior to test start time. You can use the FREE valet parking offered at the main entrance (encouraged to control the heart rate for the test) Proceed to the [facility] Radiology Department (first floor) to check-in and test prep.  Please follow these instructions carefully (unless otherwise directed):  Hold all erectile dysfunction medications at least 3 days (72 hrs) prior to test.  On the Night Before the Test: Be sure to Drink plenty of water. Do not consume any caffeinated/decaffeinated beverages or chocolate 12 hours prior to your test. Do not take any antihistamines 12 hours prior to your test.  On the Day of the Test: Drink plenty of water until 1 hour prior to the test. Do not eat any food 4 hours prior to the test. You may take your regular medications prior to the test.     After the Test: Drink plenty of water. After receiving IV contrast, you may experience a mild flushed feeling. This is normal. On occasion, you may experience a mild rash up to 24 hours after the test. This is not dangerous. If this occurs, you can take Benadryl 25 mg and increase your fluid intake. If you experience trouble breathing, this can be serious. If it is severe call 911 IMMEDIATELY. If it is mild, please call our office. If you take any of these medications: Glipizide/Metformin, Avandament, Glucavance, please do not take 48 hours after completing test unless otherwise instructed.  Please allow 2-4 weeks for scheduling of routine cardiac CTs. Some insurance companies require a pre-authorization which may delay scheduling  of this test.   For non-scheduling related questions, please contact the cardiac imaging nurse navigator should you have any questions/concerns: Marchia Bond, Cardiac Imaging Nurse Navigator Gordy Clement, Cardiac Imaging Nurse Navigator [facility] Direct Office Dial: [phone]   For scheduling needs, including cancellations and  rescheduling, please call [place], [phone].

## 2021-10-25 ENCOUNTER — Other Ambulatory Visit: Payer: Self-pay | Admitting: *Deleted

## 2021-10-25 DIAGNOSIS — Z79899 Other long term (current) drug therapy: Secondary | ICD-10-CM

## 2021-10-25 DIAGNOSIS — I5042 Chronic combined systolic (congestive) and diastolic (congestive) heart failure: Secondary | ICD-10-CM

## 2021-10-25 MED ORDER — SPIRONOLACTONE 25 MG PO TABS: 12.5000 mg | ORAL_TABLET | Freq: Every day | ORAL | 3 refills | Status: DC

## 2021-10-27 NOTE — Addendum Note (Signed)
Addended by: Jacqulynn Cadet on: 10/27/2021 09:02 AM   Modules accepted: Orders

## 2021-11-02 ENCOUNTER — Telehealth (HOSPITAL_COMMUNITY): Payer: Self-pay | Admitting: Emergency Medicine

## 2021-11-02 ENCOUNTER — Encounter (HOSPITAL_COMMUNITY): Payer: Self-pay

## 2021-11-02 NOTE — Telephone Encounter (Signed)
Attempted to call patient regarding upcoming cardiac CT appointment. °Left message on voicemail with name and callback number °Rayanna Matusik RN Navigator Cardiac Imaging °[facility] °[phone] Office °[phone] Cell ° °

## 2021-11-03 ENCOUNTER — Ambulatory Visit (HOSPITAL_COMMUNITY)
Admission: RE | Admit: 2021-11-03 | Discharge: 2021-11-03 | Disposition: A | Discharge status: Home / Self Care | Payer: Medicare HMO | Source: Ambulatory Visit | Attending: Cardiology | Admitting: Cardiology

## 2021-11-03 ENCOUNTER — Other Ambulatory Visit: Payer: Self-pay

## 2021-11-03 ENCOUNTER — Ambulatory Visit: Payer: Medicare HMO | Admitting: Pharmacist

## 2021-11-03 ENCOUNTER — Encounter (HOSPITAL_COMMUNITY): Payer: Self-pay

## 2021-11-03 DIAGNOSIS — R079 Chest pain, unspecified: Secondary | ICD-10-CM | POA: Diagnosis not present

## 2021-11-03 DIAGNOSIS — I2510 Atherosclerotic heart disease of native coronary artery without angina pectoris: Secondary | ICD-10-CM | POA: Insufficient documentation

## 2021-11-03 DIAGNOSIS — J438 Other emphysema: Secondary | ICD-10-CM

## 2021-11-03 DIAGNOSIS — E1165 Type 2 diabetes mellitus with hyperglycemia: Secondary | ICD-10-CM

## 2021-11-03 IMAGING — CT CT HEART MORP W/ CTA COR W/ SCORE W/ CA W/CM &/OR W/O CM
4 of 7 series · 8 of 20 positions shown, 9 images · IV contrast (APPLIED)
Comparison: [DATE]
COMPARISON: [DATE]

Addendum:
EXAM:
OVER-READ INTERPRETATION  CT CHEST

The following report is an over-read performed by radiologist Dr.
TANG [REDACTED] on [DATE]. This over-read
does not include interpretation of cardiac or coronary anatomy or
pathology. The coronary CTA interpretation by the cardiologist is
attached.
HISTORY: Chest pain, nonspecific
Cardiac/Coronary CT
TECHNIQUE: The patient was scanned on a Siemens Force scanner.
PROTOCOL: A 120 kV prospective scan was triggered in the descending thoracic
aorta at 111 HU's. Axial non-contrast 3 mm slices were carried out
through the heart. The data set was analyzed on a dedicated work
station and scored using the Agatson method. Gantry rotation speed
was 250 msecs and collimation was 0.6 mm. Heart rate optimized
medically, and 0.8 mg of sublingual nitroglycerin was given. The 3D
data set was reconstructed in 5% intervals of 35-75% of the R-R
cycle. Diastolic phases were analyzed on a dedicated work station
using MPR, MIP and VRT modes. The patient received 95mL OMNIPAQUE
IOHEXOL 350 MG/ML SOLN of contrast.

[Series 6: best diast · axial · 0.39mm/px · z∈[-155,-120]mm · 2 of 263 slices shown, 3 images]
[im 88/263  vessel]
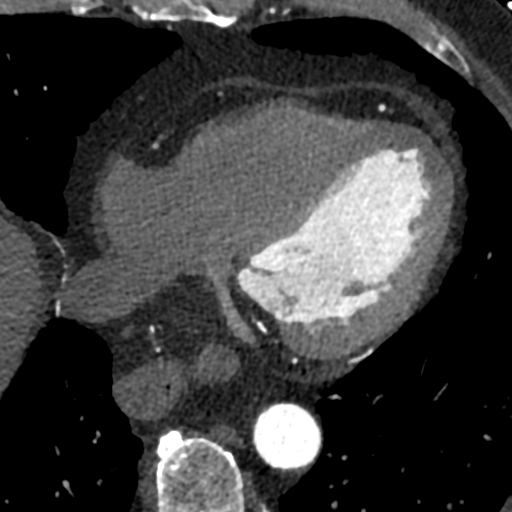
[im 88/263  lung]
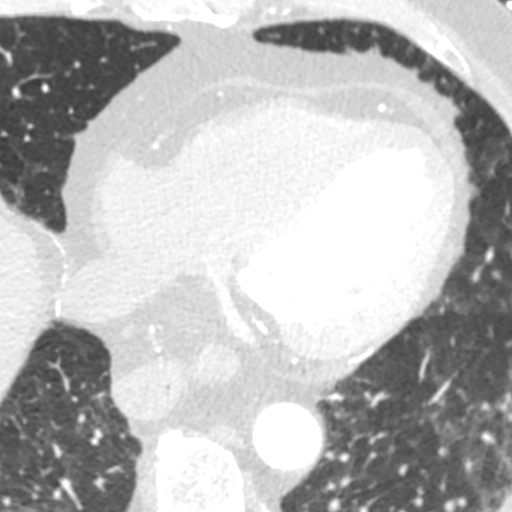
[im 175/263  vessel]
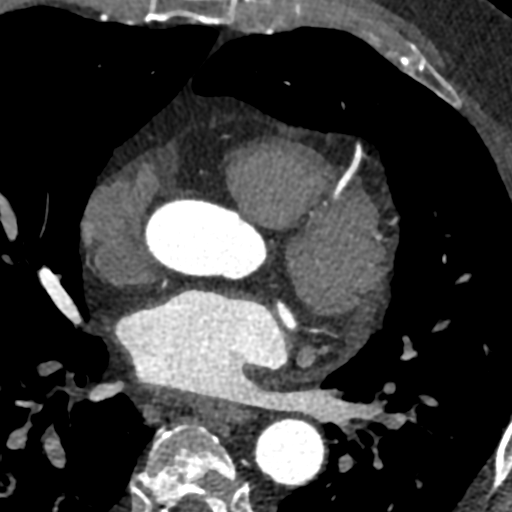

[Series 7: best syst · axial · 0.39mm/px · z∈[-155,-120]mm · 2 of 263 slices shown]
[im 88/263  vessel]
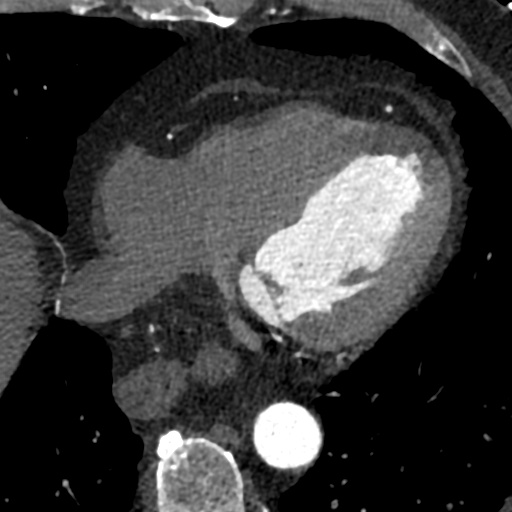
[im 175/263  vessel]
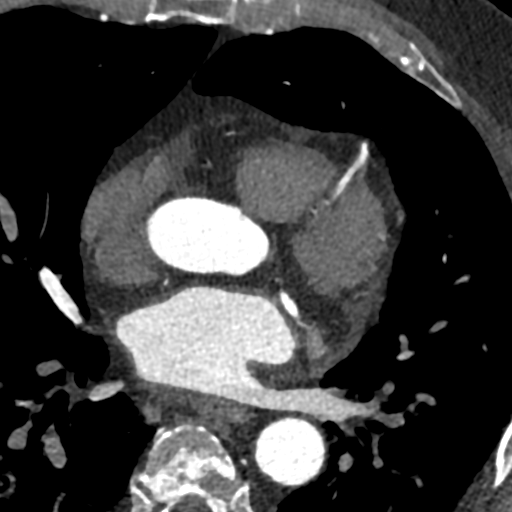

[Series 8: ts diast sharp · axial · 0.39mm/px · z∈[-155,-120]mm · 2 of 263 slices shown]
[im 88/263  lung]
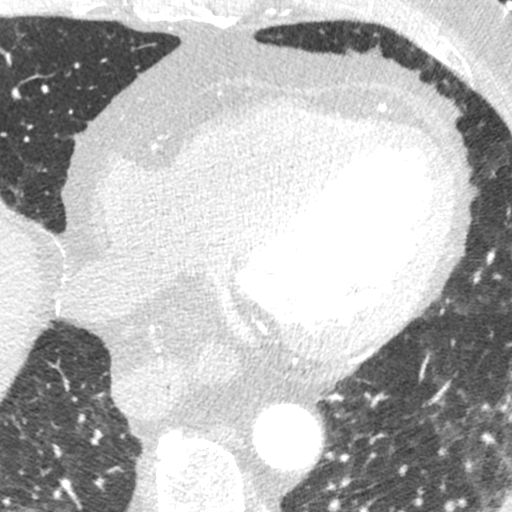
[im 175/263  lung]
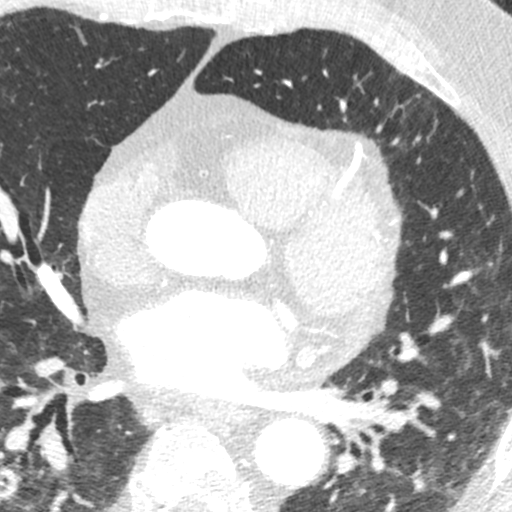

[Series 9: ts syst sharp · axial · 0.39mm/px · z∈[-155,-120]mm · 2 of 263 slices shown]
[im 88/263  lung]
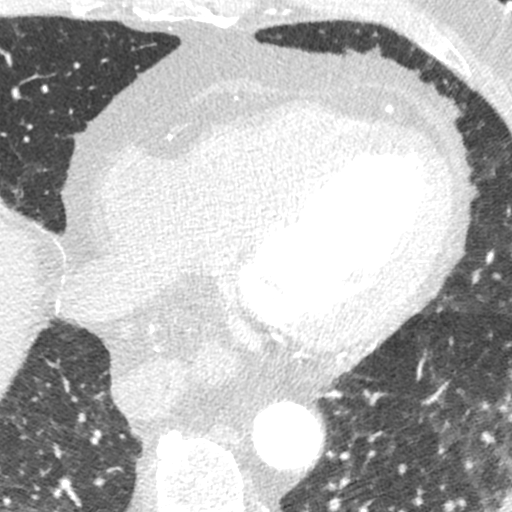
[im 175/263  lung]
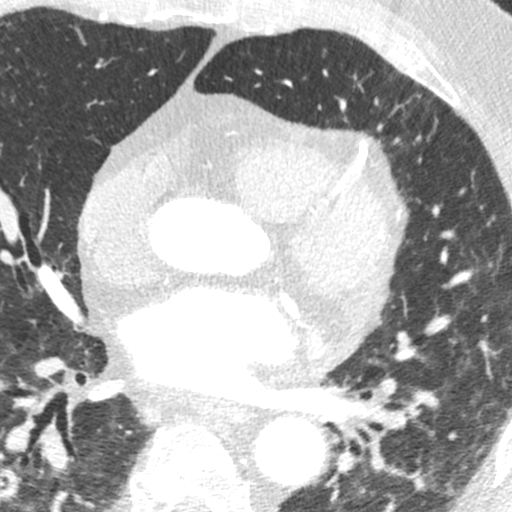

[8 of 20 positions shown; findings below may reference images not displayed]

FINDINGS: Vascular: Heart is normal size.  Aorta normal caliber.

Mediastinum/Nodes: No adenopathy.  Moderate-sized hiatal hernia.

Lungs/Pleura: Clustered nodules in the right middle lobe, likely
small airways disease/alveolitis. Nodular density at the left lung
base in the left lower lobe peripherally measures 9 mm. No
effusions.

Upper Abdomen: Imaging into the upper abdomen demonstrates no acute
findings.

Musculoskeletal: Chest wall soft tissues are unremarkable. No acute
bony abnormality.
IMPRESSION: Clustered nodular densities in the right middle lobe and 9 mm left
lower lobe pulmonary nodule. Non-contrast chest CT at 3-6 months is
recommended. If the nodules are stable at time of repeat CT, then
future CT at 18-24 months (from today's scan) is considered optional
for low-risk patients, but is recommended for high-risk patients.
This recommendation follows the consensus statement: Guidelines for
Management of Incidental Pulmonary Nodules Detected on CT Images:
FINDINGS: Coronary calcium score: The patient's coronary artery calcium score
is 59, which places the patient in the 62 percentile.

Coronary arteries: Normal coronary origins.  Left dominance.

Right Coronary Artery: Small caliber vessel. No significant plaque
or stenosis.

Left Main Coronary Artery: Normal caliber vessel. There is small
amount of proximal calcified plaque with 1-24% stenosis.

Left Anterior Descending Coronary Artery: Normal caliber vessel. No
significant plaque or stenosis. Gives rise to 3 diagonal branches.

Left Circumflex Artery: Normal caliber vessel, gives rise to the
PDA. No significant plaque or stenosis. Gives rise to 2 OM branches.

Aorta: Normal size, 34 mm at the mid ascending aorta (level of the
PA bifurcation) measured double oblique. No calcifications. No
dissection seen in visualized portions of the aorta.

Aortic Valve: No calcifications. Trileaflet.

Other findings:

Normal pulmonary vein drainage into the left atrium.

Normal left atrial appendage without a thrombus.

Normal size of the pulmonary artery.

Normal appearance of the pericardium.
IMPRESSION: 1.  Minimal nonobstructive CAD, CADRADS = 1.

2. Coronary calcium score of 59. This was 62nd percentile for age
and sex matched control.

3. Normal coronary origin with left dominance.

INTERPRETATION:

1. CAD-RADS 0: No evidence of CAD (0%). Consider non-atherosclerotic
causes of chest pain.

2. CAD-RADS 1: Minimal non-obstructive CAD (0-24%). Consider
non-atherosclerotic causes of chest pain. Consider preventive
therapy and risk factor modification.

3. CAD-RADS 2: Mild non-obstructive CAD (25-49%). Consider
non-atherosclerotic causes of chest pain. Consider preventive
therapy and risk factor modification.

4. CAD-RADS 3: Moderate stenosis (50-69%). Consider symptom-guided
anti-ischemic pharmacotherapy as well as risk factor modification
per guideline directed care. Additional analysis with CT FFR will be
submitted.

5. CAD-RADS 4: Severe stenosis. (70-99% or > 50% left main). Cardiac
catheterization or CT FFR is recommended. Consider symptom-guided
anti-ischemic pharmacotherapy as well as risk factor modification
per guideline directed care. Invasive coronary angiography
recommended with revascularization per published guideline
statements.

6. CAD-RADS 5: Total coronary occlusion (100%). Consider cardiac
catheterization or viability assessment. Consider symptom-guided
anti-ischemic pharmacotherapy as well as risk factor modification
per guideline directed care.

7. CAD-RADS N: Non-diagnostic study. Obstructive CAD can't be
excluded. Alternative evaluation is recommended.

*** End of Addendum ***
EXAM:
OVER-READ INTERPRETATION  CT CHEST

The following report is an over-read performed by radiologist Dr.
TANG [REDACTED] on [DATE]. This over-read
does not include interpretation of cardiac or coronary anatomy or
pathology. The coronary CTA interpretation by the cardiologist is
attached.
FINDINGS: Vascular: Heart is normal size.  Aorta normal caliber.

Mediastinum/Nodes: No adenopathy.  Moderate-sized hiatal hernia.

Lungs/Pleura: Clustered nodules in the right middle lobe, likely
small airways disease/alveolitis. Nodular density at the left lung
base in the left lower lobe peripherally measures 9 mm. No
effusions.

Upper Abdomen: Imaging into the upper abdomen demonstrates no acute
findings.

Musculoskeletal: Chest wall soft tissues are unremarkable. No acute
bony abnormality.
IMPRESSION: Clustered nodular densities in the right middle lobe and 9 mm left
lower lobe pulmonary nodule. Non-contrast chest CT at 3-6 months is
recommended. If the nodules are stable at time of repeat CT, then
future CT at 18-24 months (from today's scan) is considered optional
for low-risk patients, but is recommended for high-risk patients.
This recommendation follows the consensus statement: Guidelines for
Management of Incidental Pulmonary Nodules Detected on CT Images:

## 2021-11-03 MED ORDER — METOPROLOL TARTRATE 5 MG/5ML IV SOLN: 10.0000 mg | Freq: Once | INTRAVENOUS | Status: AC

## 2021-11-03 MED ORDER — IOHEXOL 350 MG/ML SOLN
95.0000 mL | Freq: Once | INTRAVENOUS | Status: AC | PRN
  Administered 2021-11-03: 95 mL via INTRAVENOUS

## 2021-11-03 MED ORDER — METOPROLOL TARTRATE 5 MG/5ML IV SOLN
INTRAVENOUS | Status: AC
  Administered 2021-11-03: 10 mg via INTRAVENOUS
  Filled 2021-11-03: qty 10

## 2021-11-03 MED ORDER — NITROGLYCERIN 0.4 MG SL SUBL
0.8000 mg | SUBLINGUAL_TABLET | Freq: Once | SUBLINGUAL | Status: AC
  Administered 2021-11-03: 0.8 mg via SUBLINGUAL

## 2021-11-03 MED ORDER — NITROGLYCERIN 0.4 MG SL SUBL: 0.8000 mg | SUBLINGUAL_TABLET | Freq: Once | SUBLINGUAL | Status: DC

## 2021-11-03 MED ORDER — NITROGLYCERIN 0.4 MG SL SUBL
SUBLINGUAL_TABLET | SUBLINGUAL | Status: AC
  Filled 2021-11-03: qty 2

## 2021-11-04 ENCOUNTER — Telehealth: Payer: Self-pay | Admitting: Cardiology

## 2021-11-04 NOTE — Telephone Encounter (Signed)
The patient has been notified of the result and verbalized understanding.  All questions (if any) were answered. Beatrix Fetters, RN 11/04/2021 4:44 PM

## 2021-11-04 NOTE — Telephone Encounter (Signed)
    Pt is returning call to get CT result 

## 2021-11-07 ENCOUNTER — Ambulatory Visit (INDEPENDENT_AMBULATORY_CARE_PROVIDER_SITE_OTHER): Payer: Medicare HMO | Admitting: Family Medicine

## 2021-11-07 ENCOUNTER — Encounter: Payer: Self-pay | Admitting: Family Medicine

## 2021-11-07 VITALS — BP 104/73 | HR 65 | Temp 97.4°F | Ht 70.0 in | Wt 263.0 lb

## 2021-11-07 DIAGNOSIS — J441 Chronic obstructive pulmonary disease with (acute) exacerbation: Secondary | ICD-10-CM | POA: Diagnosis not present

## 2021-11-07 DIAGNOSIS — R42 Dizziness and giddiness: Secondary | ICD-10-CM

## 2021-11-07 DIAGNOSIS — I5042 Chronic combined systolic (congestive) and diastolic (congestive) heart failure: Secondary | ICD-10-CM | POA: Diagnosis not present

## 2021-11-07 DIAGNOSIS — J439 Emphysema, unspecified: Secondary | ICD-10-CM | POA: Diagnosis not present

## 2021-11-07 DIAGNOSIS — R7303 Prediabetes: Secondary | ICD-10-CM

## 2021-11-07 LAB — CBC WITH DIFFERENTIAL/PLATELET
Basophils Absolute: 0.1 10*3/uL (ref 0.0–0.2)
Basos: 1 %
EOS (ABSOLUTE): 0.1 10*3/uL (ref 0.0–0.4)
Eos: 1 %
Hematocrit: 46.4 % (ref 37.5–51.0)
Hemoglobin: 15.4 g/dL (ref 13.0–17.7)
Immature Grans (Abs): 0 10*3/uL (ref 0.0–0.1)
Immature Granulocytes: 0 %
Lymphocytes Absolute: 1.6 10*3/uL (ref 0.7–3.1)
Lymphs: 17 %
MCH: 27.6 pg (ref 26.6–33.0)
MCHC: 33.2 g/dL (ref 31.5–35.7)
MCV: 83 fL (ref 79–97)
Monocytes Absolute: 0.8 10*3/uL (ref 0.1–0.9)
Monocytes: 8 %
Neutrophils Absolute: 6.8 10*3/uL (ref 1.4–7.0)
Neutrophils: 73 %
Platelets: 196 10*3/uL (ref 150–450)
RBC: 5.57 x10E6/uL (ref 4.14–5.80)
RDW: 14.3 % (ref 11.6–15.4)
WBC: 9.3 10*3/uL (ref 3.4–10.8)

## 2021-11-07 LAB — BMP8+EGFR
BUN/Creatinine Ratio: 14 (ref 10–24)
BUN: 14 mg/dL (ref 8–27)
CO2: 20 mmol/L (ref 20–29)
Calcium: 8.8 mg/dL (ref 8.6–10.2)
Chloride: 106 mmol/L (ref 96–106)
Creatinine, Ser: 0.99 mg/dL (ref 0.76–1.27)
Glucose: 100 mg/dL — ABNORMAL HIGH (ref 70–99)
Potassium: 4.3 mmol/L (ref 3.5–5.2)
Sodium: 140 mmol/L (ref 134–144)
eGFR: 79 mL/min/{1.73_m2} (ref >59)

## 2021-11-07 LAB — LIPID PANEL
Chol/HDL Ratio: 3 ratio (ref 0.0–5.0)
Cholesterol, Total: 134 mg/dL (ref 100–199)
HDL: 45 mg/dL (ref >39)
LDL Chol Calc (NIH): 66 mg/dL (ref 0–99)
Triglycerides: 129 mg/dL (ref 0–149)
VLDL Cholesterol Cal: 23 mg/dL (ref 5–40)

## 2021-11-07 LAB — BAYER DCA HB A1C WAIVED: HB A1C (BAYER DCA - WAIVED): 5.8 % — ABNORMAL HIGH (ref 4.8–5.6)

## 2021-11-07 NOTE — Patient Instructions (Signed)
Blood pressure low, likely having orthostatic dizziness, since he started spironolactone.  I know the started spironolactone to help with his heart, may consider cutting one of his other medicines in half such as either the losartan or the Jardiance.  His A1c today is 5.8 so he does not need the Jardiance for diabetes but I do know that it helps with his heart.  1 of those 2 is what I would consider cutting in half but recommended they speak with her cardiologist first if he has a different recommendation.

## 2021-11-07 NOTE — Progress Notes (Signed)
BP 104/73   Pulse 65   Temp (!) 97.4 F (36.3 C) (Temporal)   Ht 5' 10"  (1.778 m)   Wt 263 lb (119.3 kg)   SpO2 93%   BMI 37.74 kg/m    Subjective:   [name], male    DOB: 1946-06-25, 75 y.o.   MRN: [id number]  HPI: Craig Hurley is a 75 y.o. male presenting on 11/07/2021 for Medical Management of Chronic Issues   HPI Prediabetes Patient comes in today for recheck of his diabetes. Patient has been currently taking Jardiance. Patient is currently on an ACE inhibitor/ARB. Patient has seen an ophthalmologist this year. Patient denies any New issues with their feet. The symptom started onset as an adult CHF ARE RELATED TO DM   CHF follow-up Patient recently had some spironolactone added to his regiment just a couple weeks ago and over the past few days has been having orthostatic dizziness, when he stands up he feels lightheaded and dizzy and feels faint.  He has not actually passed out completely.  His blood pressure today is 104/73.  He does see cardiology for this.  He sees Dr. Nechama Guard.  COPD Patient is coming in for COPD recheck today.  He is currently on albuterol and Symbicort and Spiriva .  He has a mild chronic cough but denies any major coughing spells or wheezing spells.  He has 1nighttime symptoms per week and 1daytime symptoms per week currently. He feels like he is actually doing pretty well on his COPD medicines  Patient also has BPH and takes doxazosin, we talked about backing off on that but she is concerned because he had so much difficulty urinating in the past that if we back off on it that he will not be able to urinate.  Relevant past medical, surgical, family and social history reviewed and updated as indicated. Interim medical history since our last visit reviewed. Allergies and medications reviewed and updated.  Review of Systems  Constitutional:  Negative for chills and fever.  Respiratory:  Negative for shortness of breath and  wheezing.   Cardiovascular:  Negative for chest pain and leg swelling.  Musculoskeletal:  Negative for back pain and gait problem.  Skin:  Negative for rash.  Neurological:  Positive for dizziness and light-headedness. Negative for syncope, speech difficulty, weakness and numbness.  All other systems reviewed and are negative.  Per HPI unless specifically indicated above   Allergies as of 11/07/2021       Reactions   Penicillins Swelling        Medication List        Accurate as of November 07, 2021  9:52 AM. If you have any questions, ask your nurse or doctor.          acetaminophen 325 MG tablet Commonly known as: TYLENOL Take 650 mg by mouth every 6 (six) hours as needed.   albuterol 108 (90 Base) MCG/ACT inhaler Commonly known as: VENTOLIN HFA Inhale 2 puffs into the lungs every 4 (four) hours as needed for wheezing or shortness of breath.   aspirin EC 325 MG tablet Take 1 tablet (325 mg total) by mouth daily.   atorvastatin 10 MG tablet Commonly known as: LIPITOR Take 1 tablet (10 mg total) by mouth daily.   B-12 PO Take 1,000 mcg by mouth daily.   budesonide-formoterol 160-4.5 MCG/ACT inhaler Commonly known as: SYMBICORT Inhale 2 puffs into the lungs 2 (two) times daily.   carvedilol 3.125 MG tablet Commonly known  as: COREG Take 1 tablet (3.125 mg total) by mouth 2 (two) times daily.   cetirizine 10 MG tablet Commonly known as: ZYRTEC Take 1 tablet (10 mg total) by mouth daily.   diclofenac Sodium 1 % Gel Commonly known as: Voltaren Apply 2 g topically 4 (four) times daily.   doxazosin 4 MG tablet Commonly known as: CARDURA TAKE 1 TABLET EVERY DAY   empagliflozin 10 MG Tabs tablet Commonly known as: Jardiance Take 1 tablet (10 mg total) by mouth daily before breakfast.   losartan 25 MG tablet Commonly known as: COZAAR Take 1 tablet (25 mg total) by mouth daily.   meloxicam 15 MG tablet Commonly known as: MOBIC TAKE 1 TABLET EVERY DAY    montelukast 10 MG tablet Commonly known as: SINGULAIR Take 1 tablet (10 mg total) by mouth at bedtime.   omeprazole 20 MG capsule Commonly known as: PRILOSEC TAKE 1 CAPSULE TWICE DAILY BEFORE MEALS   PRESERVISION AREDS 2 PO Take 1 capsule by mouth 2 (two) times daily.   Spiriva Respimat 2.5 MCG/ACT Aers Generic drug: Tiotropium Bromide Monohydrate Inhale 2 puffs into the lungs daily.   spironolactone 25 MG tablet Commonly known as: ALDACTONE Take 0.5 tablets (12.5 mg total) by mouth daily.   vitamin A 8000 UNIT capsule Take 8,000 Units by mouth daily.         Objective:   BP 104/73   Pulse 65   Temp (!) 97.4 F (36.3 C) (Temporal)   Ht 5' 10"  (1.778 m)   Wt 263 lb (119.3 kg)   SpO2 93%   BMI 37.74 kg/m   Wt Readings from Last 3 Encounters:  11/07/21 263 lb (119.3 kg)  10/24/21 256 lb 6.4 oz (116.3 kg)  09/28/21 251 lb (113.9 kg)    Physical Exam Vitals and nursing note reviewed.  Constitutional:      General: He is not in acute distress.    Appearance: He is well-developed. He is not diaphoretic.  Eyes:     General: No scleral icterus.    Conjunctiva/sclera: Conjunctivae normal.  Neck:     Thyroid: No thyromegaly.  Cardiovascular:     Rate and Rhythm: Normal rate and regular rhythm.     Heart sounds: Normal heart sounds. No murmur heard. Pulmonary:     Effort: Pulmonary effort is normal. No respiratory distress.     Breath sounds: Normal breath sounds. No wheezing.  Musculoskeletal:        General: Normal range of motion.     Cervical back: Neck supple.  Lymphadenopathy:     Cervical: No cervical adenopathy.  Skin:    General: Skin is warm and dry.     Findings: No rash.  Neurological:     Mental Status: He is alert and oriented to person, place, and time.     Coordination: Coordination normal.  Psychiatric:        Behavior: Behavior normal.   Orthostatic blood pressures performed today, normal, stays the same    Assessment & Plan:    Problem List Items Addressed This Visit       Cardiovascular and Mediastinum   Chronic combined systolic and diastolic heart failure (HCC)     Respiratory   COPD (chronic obstructive pulmonary disease) with emphysema (HCC)   Relevant Orders   Lipid panel   CBC with Differential/Platelet     Other   Prediabetes - Primary   Relevant Orders   Bayer DCA Hb A1c Waived   CBC with Differential/Platelet  Other Visit Diagnoses     COPD with acute exacerbation ([place])       Relevant Orders   BMP8+EGFR   Lightheadedness       Orthostatic dizziness           Blood pressure low, likely having orthostatic dizziness, since he started spironolactone.  I know the started spironolactone to help with his heart, may consider cutting one of his other medicines in half such as either the losartan or the Jardiance.  His A1c today is 5.8 so he does not need the Jardiance for diabetes but I do know that it helps with his heart.  1 of those 2 is what I would consider cutting in half but recommended they speak with her cardiologist first if he has a different recommendation. Follow up plan: Return in about 3 months (around 02/05/2022), or if symptoms worsen or fail to improve, for Prediabetes and CHF and COPD.  Counseling provided for all of the vaccine components Orders Placed This Encounter  Procedures   Bayer [place] Hb A1c Waived   BMP8+EGFR   Lipid panel   CBC with Differential/Platelet    Caryl Pina, MD [facility] 11/07/2021, 9:52 AM

## 2021-11-08 ENCOUNTER — Encounter: Payer: Self-pay | Admitting: Cardiology

## 2021-11-09 ENCOUNTER — Encounter: Payer: Self-pay | Admitting: Family Medicine

## 2021-11-09 NOTE — Telephone Encounter (Signed)
It seems that symptoms have started since he started spironolactone, would recommend discontinuing

## 2021-11-09 NOTE — Telephone Encounter (Signed)
Spoke to patient's wife she stated husband's B/P has been low.Readings listed below.Stated B/P at present 98/64 pulse ranging 68 to 72.Advised to hold pm dose of Carvedilol.I will send message to [place] for advice.

## 2021-11-11 NOTE — Patient Instructions (Signed)
Visit Information  Thank you for taking time to visit with me today. Please don't hesitate to contact me if I can be of assistance to you before our next scheduled telephone appointment.  Following are the goals we discussed today:  Current Barriers:  Unable to independently afford treatment regimen  Pharmacist Clinical Goal(s):  Over the next 90 days, patient will verbalize ability to afford treatment regimen through collaboration with PharmD and provider.   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  Echocardiogram on 04/12/2021 showed LVEF 45 to 50% Assessed patient finances. Will re-enroll in [employer] [facility] patient assistance for jardiance; will also enroll for spirivia respimat for 2023; medication ships to patient's home   Patient Goals/Self-Care Activities Over the next 90 days, patient will:  - take medications as prescribed collaborate with provider on medication access solutions  Follow Up Plan: Telephone follow up appointment with care management team member scheduled for: 3 months    Please call the care guide team at [phone] if you need to cancel or reschedule your appointment.   The patient verbalized understanding of instructions, educational materials, and care plan provided today and declined offer to receive copy of patient instructions, educational materials, and care plan.   Regina Eck, PharmD, BCPS Clinical Pharmacist, [place]  II Phone [phone]

## 2021-11-16 ENCOUNTER — Encounter: Payer: Self-pay | Admitting: Family Medicine

## 2021-11-16 NOTE — Progress Notes (Addendum)
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  Echocardiogram on 04/12/2021 showed LVEF 45 to 50% °Assessed patient finances. Will re-enroll in BI cares patient assistance for jardiance; will also enroll for spirivia respimat for 2023; medication ships to patient's home ° ° °Patient Goals/Self-Care Activities °Over the next 90 days, patient will:  °- take medications as prescribed °collaborate with provider on medication access solutions ° °Follow Up Plan: Telephone follow up appointment with care management team member scheduled for: 3 months ° °Subjective: °Craig Hurley is an 75 y.o. year old male who is a primary patient of Dettinger, Joshua A, MD.  The CCM team was consulted for assistance with disease management and care coordination needs.   ° °Engaged with patient by telephone for follow up visit in response to provider referral for pharmacy case management and/or care coordination services.  ° °Consent to Services:  °The patient was given information about Chronic Care Management services, agreed to services, and gave verbal consent prior to initiation of services.  Please see initial visit note for detailed documentation.  ° °Patient Care Team: °Dettinger, Joshua A, MD as PCP - General (Family Medicine) °Pruitt, Julie D, RPH (Pharmacist) °Davis, Rachelle, OD (Optometry) °Schumann, Christopher L, MD as Consulting Physician (Cardiology) °Yan, Yijun, MD as Consulting Physician (Neurology) ° °Objective: ° °Lab Results  °Component Value Date  ° CREATININE 0.99 11/07/2021  ° CREATININE 0.99 10/24/2021  ° CREATININE 0.98 06/22/2021  ° ° °Lab Results  °Component Value Date   ° HGBA1C 5.8 (H) 11/07/2021  ° °Last diabetic Eye exam: No results found for: HMDIABEYEEXA  °Last diabetic Foot exam: No results found for: HMDIABFOOTEX  ° °   °Component Value Date/Time  ° CHOL 134 11/07/2021 0922  ° TRIG 129 11/07/2021 0922  ° HDL 45 11/07/2021 0922  ° CHOLHDL 3.0 11/07/2021 0922  ° LDLCALC 66 11/07/2021 0922  ° ° °Hepatic Function Latest Ref Rng & Units 05/05/2021 11/04/2020 05/05/2020  °Total Protein 6.0 - 8.5 g/dL 6.7 6.5 6.4  °Albumin 3.7 - 4.7 g/dL 4.0 4.0 4.1  °AST 0 - 40 IU/L 13 9 12  °ALT 0 - 44 IU/L 14 11 11  °Alk Phosphatase 44 - 121 IU/L 95 91 91  °Total Bilirubin 0.0 - 1.2 mg/dL 0.3 0.3 0.2  ° ° °Lab Results  °Component Value Date/Time  ° TSH 4.320 12/09/2020 09:01 AM  ° ° °CBC Latest Ref Rng & Units 11/07/2021 05/05/2021 09/22/2020  °WBC 3.4 - 10.8 x10E3/uL 9.3 9.6 9.6  °Hemoglobin 13.0 - 17.7 g/dL 15.4 15.0 15.8  °Hematocrit 37.5 - 51.0 % 46.4 46.7 47.6  °Platelets 150 - 450 x10E3/uL 196 222 200  ° ° °No results found for: VD25OH ° °Clinical ASCVD: Yes  °The ASCVD Risk score (Arnett DK, et al., 2019) failed to calculate for the following reasons: °  The patient has a prior MI or stroke diagnosis   ° °Other: (CHADS2VASc if Afib, PHQ9 if depression, MMRC or CAT for COPD, ACT, DEXA) ° °Social History  ° °Tobacco Use  °Smoking Status Former  ° Types: Cigars  °Smokeless Tobacco Current  ° Types: Snuff  ° °BP Readings from Last 3 Encounters:  °11/07/21 104/73  °11/03/21 116/60  °10/24/21   10/24/21 118/72   Pulse Readings from Last 3 Encounters:  11/07/21 65  11/03/21 60  10/24/21 (!) 59   Wt Readings from Last 3 Encounters:  11/07/21 263 lb (119.3 kg)  10/24/21 256 lb 6.4 oz (116.3 kg)  09/28/21 251 lb (113.9 kg)    Assessment: Review of patient past medical history, allergies, medications, health status, including review of consultants reports, laboratory and other test data, was performed as part of comprehensive evaluation and provision of chronic care management services.   SDOH:   (Social Determinants of Health) assessments and interventions performed:    CCM Care Plan  Allergies  Allergen Reactions   Penicillins Swelling    Medications Reviewed Today     Reviewed by Lavera Guise, [facility] (Pharmacist) on 11/16/21 at 0751  Med List Status: <None>   Medication Order Taking? Sig Documenting Provider Last Dose Status Informant  acetaminophen (TYLENOL) 325 MG tablet [id number] No Take 650 mg by mouth every 6 (six) hours as needed. [provider] Taking Active   albuterol (VENTOLIN HFA) 108 (90 Base) MCG/ACT inhaler [id number] No Inhale 2 puffs into the lungs every 4 (four) hours as needed for wheezing or shortness of breath. Marrian Salvage, FNP Taking Active   aspirin EC 325 MG tablet [id number] No Take 1 tablet (325 mg total) by mouth daily. Marcial Pacas, MD Taking Active   atorvastatin (LIPITOR) 10 MG tablet [id number] No Take 1 tablet (10 mg total) by mouth daily. Dettinger, Fransisca Kaufmann, MD Taking Active   budesonide-formoterol [facility]) 160-4.5 MCG/ACT inhaler [id number] No Inhale 2 puffs into the lungs 2 (two) times daily. Dettinger, Fransisca Kaufmann, MD Taking Active   carvedilol (COREG) 3.125 MG tablet [id number] No Take 1 tablet (3.125 mg total) by mouth 2 (two) times daily. Donato Heinz, MD Taking Active   cetirizine (ZYRTEC) 10 MG tablet [id number] No Take 1 tablet (10 mg total) by mouth daily. Dettinger, Fransisca Kaufmann, MD Taking Active   Cyanocobalamin (B-12 PO) [id number] No Take 1,000 mcg by mouth daily. [provider] Taking Active   diclofenac Sodium (VOLTAREN) 1 % GEL [id number] No Apply 2 g topically 4 (four) times daily. Dettinger, Fransisca Kaufmann, MD Taking Active   doxazosin (CARDURA) 4 MG tablet [id number] No TAKE 1 TABLET EVERY DAY Dettinger, Fransisca Kaufmann, MD Taking Active   empagliflozin (JARDIANCE) 10 MG TABS tablet [id number] No Take 1 tablet (10 mg total) by mouth daily before breakfast. Donato Heinz, MD Taking Active            Med  Note Blanca Friend, Sherian Maroon Jul 28, 2021 10:55 AM) VIA BI CARES PATIENT ASSISTANCE   losartan (COZAAR) 25 MG tablet [id number] No Take 1 tablet (25 mg total) by mouth daily. Donato Heinz, MD Taking Active   meloxicam [facility]) 15 MG tablet [id number] No TAKE 1 TABLET EVERY DAY Dettinger, Fransisca Kaufmann, MD Taking Active   montelukast (SINGULAIR) 10 MG tablet [id number] No Take 1 tablet (10 mg total) by mouth at bedtime. Dettinger, Fransisca Kaufmann, MD Taking Active   Multiple Vitamins-Minerals (PRESERVISION AREDS 2 PO) [id number] No Take 1 capsule by mouth 2 (two) times daily. [provider] Taking Active   omeprazole (PRILOSEC) 20 MG capsule [id number] No TAKE 1 CAPSULE TWICE DAILY BEFORE MEALS Dettinger, Fransisca Kaufmann, MD Taking Active   spironolactone (ALDACTONE) 25 MG tablet [id number] No Take 0.5 tablets (12.5 mg total) by mouth daily. Donato Heinz, MD Taking Active   Tiotropium Bromide Monohydrate ([place])  2.5 MCG/ACT AERS [id number] No Inhale 2 puffs into the lungs daily. [provider] Taking Active            Med Note Blanca Friend, Sherian Maroon Jul 28, 2021 10:55 AM) VIA BI CARES PATIENT ASSISTANCE  vitamin A 8000 UNIT capsule [id number] No Take 8,000 Units by mouth daily. 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/2015   BPH (benign prostatic hyperplasia) 10/25/2015   GERD (gastroesophageal reflux disease) 10/25/2015    Immunization History  Administered Date(s) Administered   Fluad Quad(high Dose  65+) 08/06/2019, 09/22/2020, 09/16/2021   H1N1 01/14/2009   Influenza Split 12/25/2005, 10/12/2009, 11/07/2010, 07/18/2011, 11/14/2012, 08/30/2013   Influenza, High Dose Seasonal PF 09/28/2017, 09/11/2018   Influenza,inj,Quad PF,6+ Mos 09/12/2014, 08/18/2015, 10/25/2016   Influenza-Unspecified 09/11/2018   Moderna SARS-COV2 Booster Vaccination 11/10/2020   Moderna Sars-Covid-2 Vaccination 02/06/2020, 03/09/2020, 06/09/2021   Pneumococcal Conjugate-13 01/18/2015   Pneumococcal Polysaccharide-23 01/12/2011   Td 11/22/1998, 07/11/2018   Tdap 01/08/2008   Zoster Recombinat (Shingrix) 05/05/2021   Zoster, Live 01/17/2012    Conditions to be addressed/monitored: CHF and COPD  Care Plan : PHARMD MEDICATION MANAGEMENT  Updates made by Lavera Guise, [place] since 11/16/2021 12:00 AM     Problem: DISEASE PROGRESSION PREVENTION      Long-Range Goal: CHF   Recent Progress: On track  Priority: High  Note:   Current Barriers:  Unable to independently afford treatment regimen  Pharmacist Clinical Goal(s):  Over the next 90 days, patient will verbalize ability to afford treatment regimen through collaboration with PharmD and provider.   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  Echocardiogram on 04/12/2021 showed LVEF 45 to 50% Assessed patient finances. Will re-enroll in BI cares patient assistance for jardiance; will also enroll for spirivia respimat for 2023; medication ships to  patient's home   Patient Goals/Self-Care Activities Over the next 90 days, patient will:  - take medications as prescribed collaborate with provider on medication access solutions  Follow Up Plan: Telephone follow up appointment with care management team member scheduled for: 3 months       Medication Assistance: None required.  Patient affirms current coverage meets needs.  Patient's preferred pharmacy is:  [facility] [address] [place] [address] [place] [address] Phone: [phone] Fax: [phone]  [facility] [place], [place] [address] [place] [address] Phone: [phone] Fax: [place] [facility] [place], [place] [address]  [phone] Fax: [phone] ° °Follow Up:  Patient agrees to Care Plan and Follow-up. ° °Plan: Telephone follow up appointment with care management team member scheduled for:  3 MONTHS ° ° °Julie Dattero Pruitt, PharmD, BCPS °Clinical Pharmacist, [facility] °[place]  II Phone [phone] ° ° ° ° ° °

## 2021-12-11 ENCOUNTER — Other Ambulatory Visit: Payer: Self-pay | Admitting: Family Medicine

## 2021-12-13 NOTE — Progress Notes (Signed)
Received notification from [employer]) regarding approval for [employer]. Patient assistance approved from 11/27/21 to 11/26/22.  NEXT SHIPMENT GOES OUT 12/15/21  PT CAN CALL COMPANY FOR REFILLS USING INFO FROM RX IN SHIPMENT  Phone: [phone]

## 2021-12-14 ENCOUNTER — Encounter: Payer: Self-pay | Admitting: Family Medicine

## 2021-12-14 ENCOUNTER — Telehealth: Payer: Self-pay | Admitting: Family Medicine

## 2021-12-14 NOTE — Telephone Encounter (Signed)
Dr. Warrick Parisian, I just sent you another message about this.  I have called the patient's wife and given her this information.

## 2021-12-14 NOTE — Telephone Encounter (Signed)
Please let patient know:  -Spiriva samples left up front -Will have the pharmacy technician check on status of patient assistance; we have been overloaded with applications -Thank you for your patience

## 2021-12-14 NOTE — Telephone Encounter (Signed)
It looks like Craig Hurley since some samples of her Spiriva and is working on it.

## 2021-12-14 NOTE — Telephone Encounter (Signed)
I do not see any documentation of this.  Do you know what he is referring to?

## 2021-12-15 MED ORDER — SPIRIVA RESPIMAT 2.5 MCG/ACT IN AERS: 2.0000 | INHALATION_SPRAY | Freq: Every day | RESPIRATORY_TRACT | 3 refills | Status: DC

## 2021-12-26 ENCOUNTER — Telehealth: Payer: Self-pay | Admitting: Family Medicine

## 2021-12-26 NOTE — Telephone Encounter (Signed)
Spouse came in to office today. Please send budesonide-formoterol (SYMBICORT) 160-4.5 MCG/ACT inhaler to [facility] fax [phone]

## 2021-12-27 MED ORDER — BUDESONIDE-FORMOTEROL FUMARATE 160-4.5 MCG/ACT IN AERO: 2.0000 | INHALATION_SPRAY | Freq: Two times a day (BID) | RESPIRATORY_TRACT | 3 refills | Status: DC

## 2021-12-27 NOTE — Telephone Encounter (Signed)
Aware refill sent to pt assistance program. Almyra Free informed me that pharmacy was now [facility] and I let them know this.

## 2021-12-29 ENCOUNTER — Telehealth: Payer: Self-pay | Admitting: Family Medicine

## 2021-12-29 ENCOUNTER — Ambulatory Visit (INDEPENDENT_AMBULATORY_CARE_PROVIDER_SITE_OTHER): Payer: Medicare HMO | Admitting: *Deleted

## 2021-12-29 DIAGNOSIS — Z23 Encounter for immunization: Secondary | ICD-10-CM

## 2021-12-29 NOTE — Progress Notes (Signed)
Pt given Shingles vaccine IM left deltoid and tolerated well.

## 2021-12-29 NOTE — Telephone Encounter (Signed)
Spoke with patients wife who is upset states all they have had is problems with his prescriptions this year. States Almyra Free helps him get this through prescription assistance. They told patient they needed to contact here because the prescriptions sent in for him had the wrong DOb and that it needed to be changed ASAP. Please advise

## 2022-01-02 NOTE — Telephone Encounter (Signed)
Left voice message requesting call back [phone]

## 2022-01-03 NOTE — Telephone Encounter (Signed)
Spoke with Mr & Mrs Craig Hurley regarding the BI Cares medications.... Pt rec'd his Jardiance rx in the mail. Was told the DOB was incorrect on Spiriva app. After f/u with app, the DOB on the application prescription page was incorrect. Informed pt the page has been refaxed and we should both hear from the company soon. Pt's wife expressed thanks and says she will call and f/u with the company in a few days as well.

## 2022-01-10 ENCOUNTER — Other Ambulatory Visit: Payer: Self-pay | Admitting: Family Medicine

## 2022-01-25 ENCOUNTER — Other Ambulatory Visit: Payer: Self-pay

## 2022-01-25 ENCOUNTER — Ambulatory Visit: Payer: Medicare HMO | Admitting: Physician Assistant

## 2022-01-25 ENCOUNTER — Encounter: Payer: Self-pay | Admitting: Physician Assistant

## 2022-01-25 VITALS — BP 132/76 | HR 63 | Ht 70.0 in | Wt 255.8 lb

## 2022-01-25 DIAGNOSIS — J449 Chronic obstructive pulmonary disease, unspecified: Secondary | ICD-10-CM | POA: Diagnosis not present

## 2022-01-25 DIAGNOSIS — R918 Other nonspecific abnormal finding of lung field: Secondary | ICD-10-CM | POA: Diagnosis not present

## 2022-01-25 DIAGNOSIS — R0789 Other chest pain: Secondary | ICD-10-CM

## 2022-01-25 DIAGNOSIS — E785 Hyperlipidemia, unspecified: Secondary | ICD-10-CM

## 2022-01-25 DIAGNOSIS — Z8673 Personal history of transient ischemic attack (TIA), and cerebral infarction without residual deficits: Secondary | ICD-10-CM | POA: Diagnosis not present

## 2022-01-25 DIAGNOSIS — I428 Other cardiomyopathies: Secondary | ICD-10-CM | POA: Diagnosis not present
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
## 2022-01-25 NOTE — Patient Instructions (Addendum)
Medication Instructions:  ?Your physician recommends that you continue on your current medications as directed. Please refer to the Current Medication list given to you today. ? ? ?*If you need a refill on your cardiac medications before your next appointment, please call your pharmacy* ? ? ?Lab Work: ?NONE ordered at this time of appointment - ? ?Testing/Procedures: ?Non-Cardiac CT scanning, (CAT scanning), is a noninvasive, special x-ray that produces cross-sectional images of the body using x-rays and a computer. CT scans help physicians diagnose and treat medical conditions. For some CT exams, a contrast material is used to enhance visibility in the area of the body being studied. CT scans provide greater clarity and reveal more details than regular x-ray exams.  ? ?Follow-Up: ?At [facility], you and your health needs are our priority.  As part of our continuing mission to provide you with exceptional heart care, we have created designated Provider Care Teams.  These Care Teams include your primary Cardiologist (physician) and Advanced Practice Providers (APPs -  Physician Assistants and Nurse Practitioners) who all work together to provide you with the care you need, when you need it. ? ?We recommend signing up for the patient portal called "MyChart".  Sign up information is provided on this After Visit Summary.  MyChart is used to connect with patients for Virtual Visits (Telemedicine).  Patients are able to view lab/test results, encounter notes, upcoming appointments, etc.  Non-urgent messages can be sent to your provider as well.   ?To learn more about what you can do with MyChart, go to [email or web address].   ? ?Your next appointment:   ?6 month(s) ? ?The format for your next appointment:   ?In Person ? ?Provider:   ?Donato Heinz, MD   ? ? ?Other Instructions ? ? ?

## 2022-01-27 ENCOUNTER — Encounter: Payer: Self-pay | Admitting: Physician Assistant

## 2022-02-08 DIAGNOSIS — H3561 Retinal hemorrhage, right eye: Secondary | ICD-10-CM | POA: Diagnosis not present

## 2022-02-09 NOTE — Telephone Encounter (Signed)
Craig Hurley, I previously ordered a CT of chest wo contrast on [name] because of the lung nodule seen on the previous coronary CT in Dec 2022, can you see if precert has reviewed this case and ready to schedule?

## 2022-02-15 ENCOUNTER — Telehealth: Payer: Self-pay | Admitting: Cardiology

## 2022-02-15 NOTE — Telephone Encounter (Signed)
Patient's wife is called stating the she received an auth for her Mr [place]rosiers's CT scan and it states it expires on 03/09/22.  She called [facility] to schedule and they told her that they are to the end of April in scheduling. That another order would have to be placed saying STAT.  They prefer to go to [facility] to have it done.   ?

## 2022-02-15 NOTE — Telephone Encounter (Signed)
Patient is scheduled for CT scan on 02/20/22 at 11 AM. ?

## 2022-02-15 NOTE — Telephone Encounter (Signed)
Called and spoke with patient's wife Mrs. Amedeo Plenty. Asked patient if they are willing to travel to [facility] to have CT performed if we can get it scheduled during the time frame of the authorization. She stated that would be fine that they only requested [facility] due to closeness.  ?

## 2022-02-15 NOTE — Telephone Encounter (Signed)
Wife stated she received approval for patient's CT, but the time frame is 3/14 - 4/13. She said [facility] is booked through the end of the month and will need a new authorization. Can you help [place] on this? Thank you. ?

## 2022-02-20 ENCOUNTER — Ambulatory Visit (INDEPENDENT_AMBULATORY_CARE_PROVIDER_SITE_OTHER)
Admission: RE | Admit: 2022-02-20 | Discharge: 2022-02-20 | Disposition: A | Discharge status: Home / Self Care | Payer: Medicare HMO | Source: Ambulatory Visit | Attending: Physician Assistant | Admitting: Physician Assistant

## 2022-02-20 ENCOUNTER — Other Ambulatory Visit: Payer: Self-pay

## 2022-02-20 DIAGNOSIS — I3139 Other pericardial effusion (noninflammatory): Secondary | ICD-10-CM | POA: Diagnosis not present

## 2022-02-20 DIAGNOSIS — R911 Solitary pulmonary nodule: Secondary | ICD-10-CM | POA: Diagnosis not present

## 2022-02-20 DIAGNOSIS — R918 Other nonspecific abnormal finding of lung field: Secondary | ICD-10-CM

## 2022-02-20 DIAGNOSIS — J984 Other disorders of lung: Secondary | ICD-10-CM | POA: Diagnosis not present

## 2022-02-20 IMAGING — CT CT CHEST W/O CM
2 of 4 series · 15 of 36 positions shown, 18 images · non-contrast
Comparison: CT heart [DATE], CT chest [DATE]

CLINICAL DATA: Follow up clustered nodular densities RML and 9 mm
LLL nodule



[Series 2: thorax · axial · 0.84mm/px · z∈[-312,-44]mm · 12 of 160 slices shown, 15 images]
[im 13/160  mediastinal]
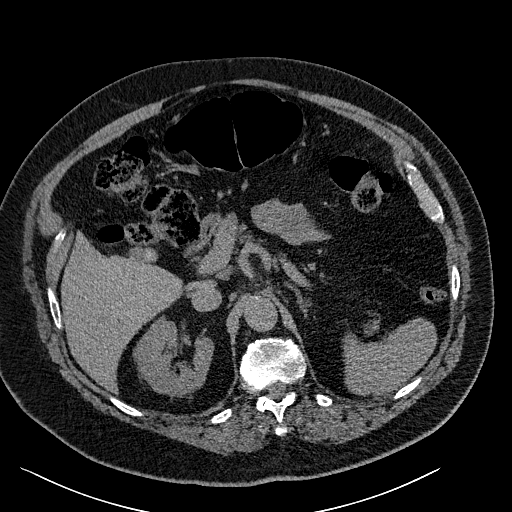
[im 13/160  lung]
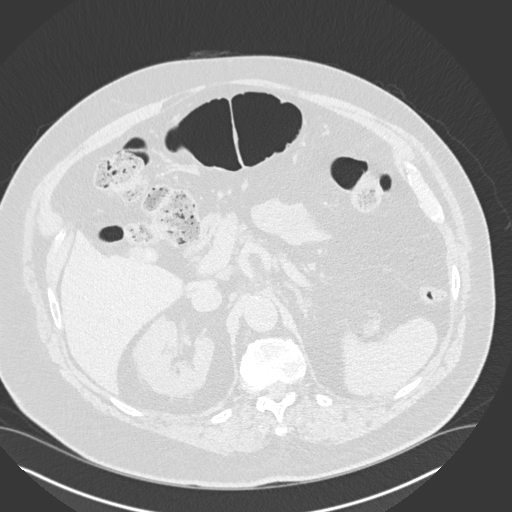
[im 25/160  lung]
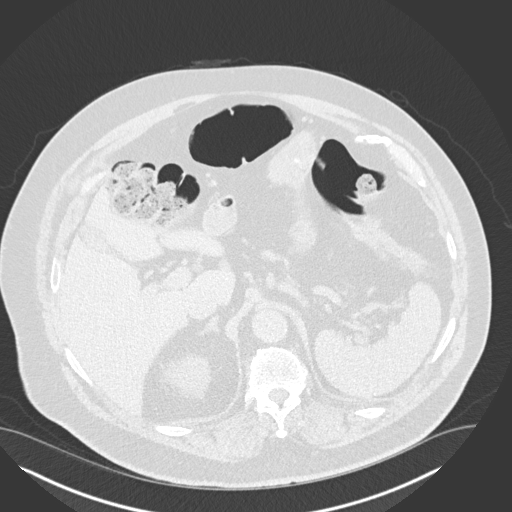
[im 37/160  lung]
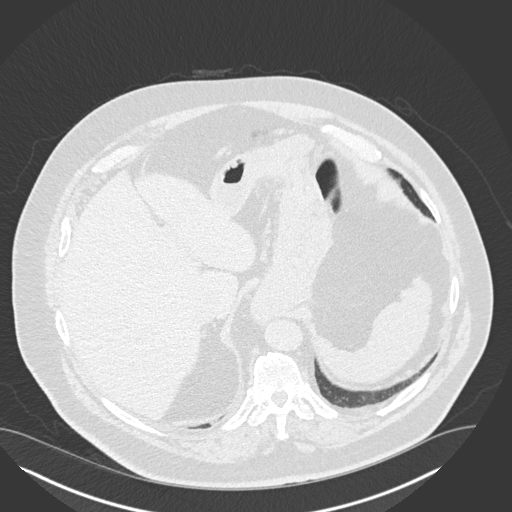
[im 49/160  lung]
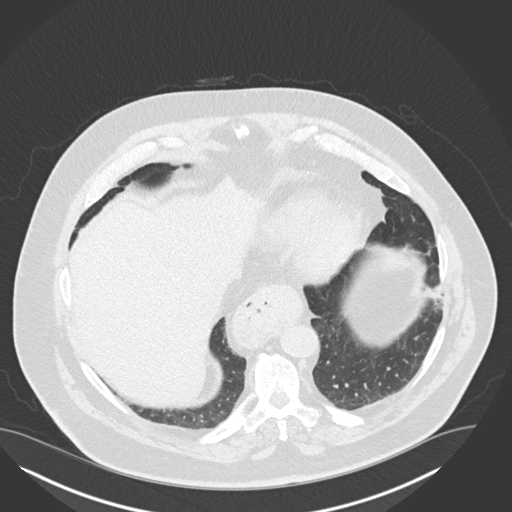
[im 62/160  mediastinal]
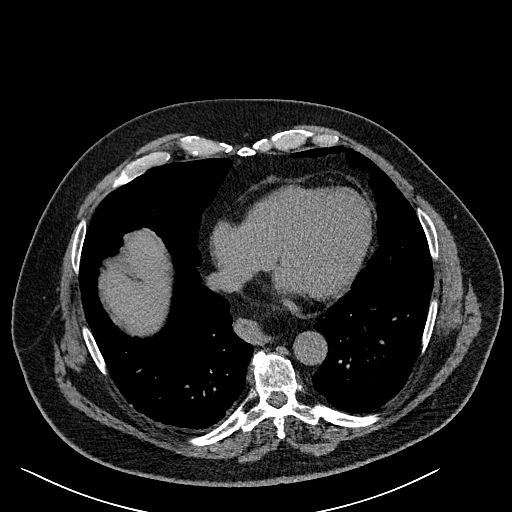
[im 62/160  lung]
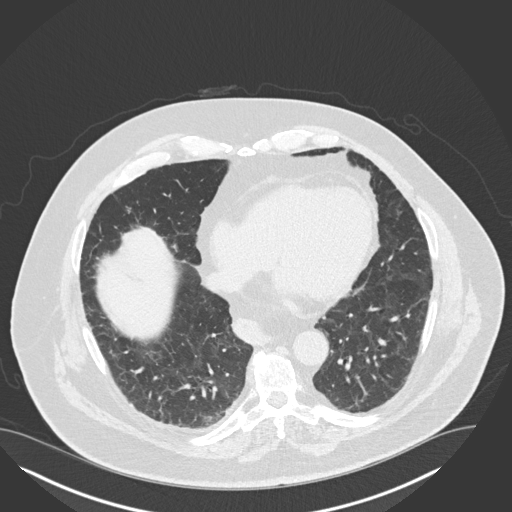
[im 74/160  lung]
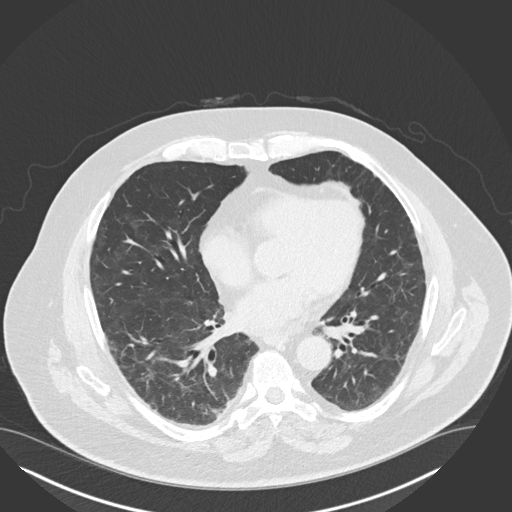
[im 86/160  lung]
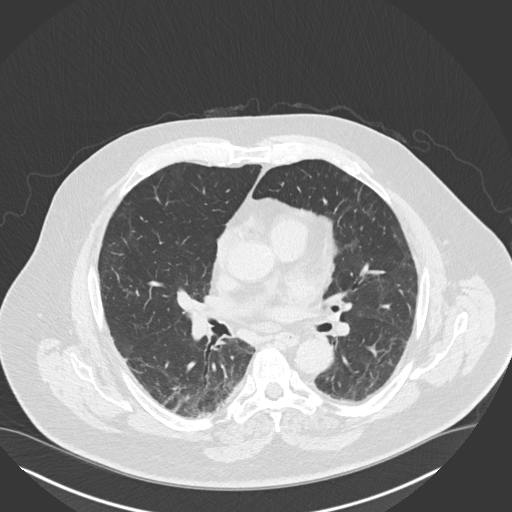
[im 98/160  lung]
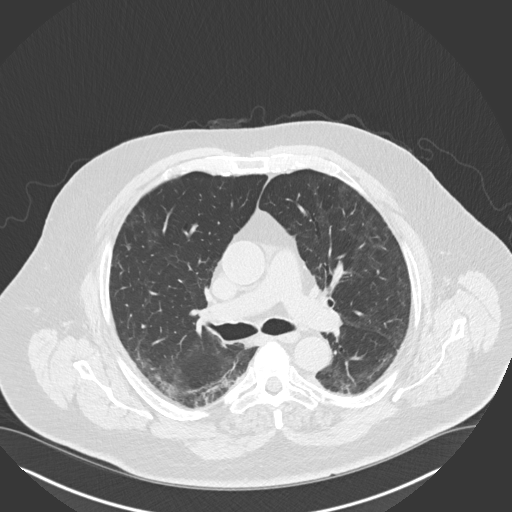
[im 111/160  mediastinal]
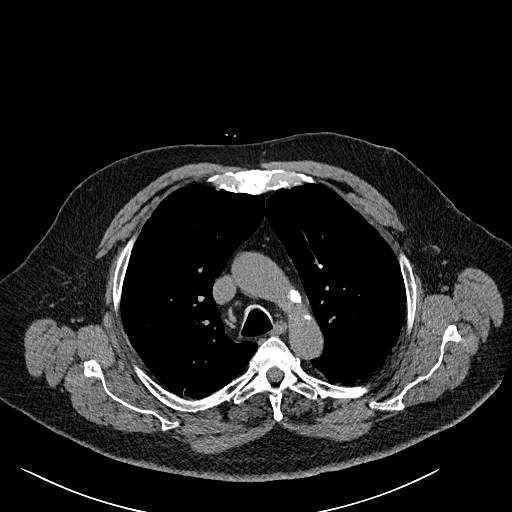
[im 111/160  lung]
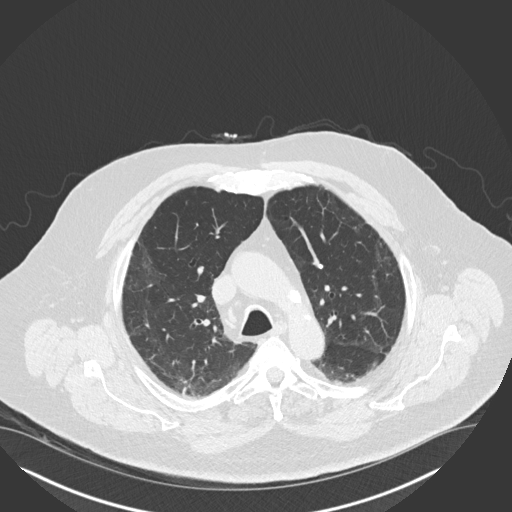
[im 123/160  lung]
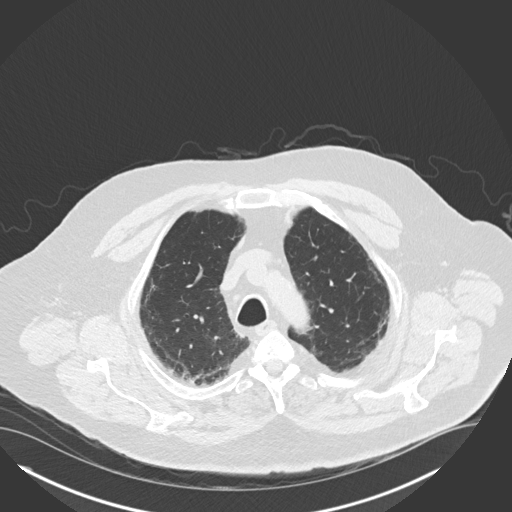
[im 135/160  lung]
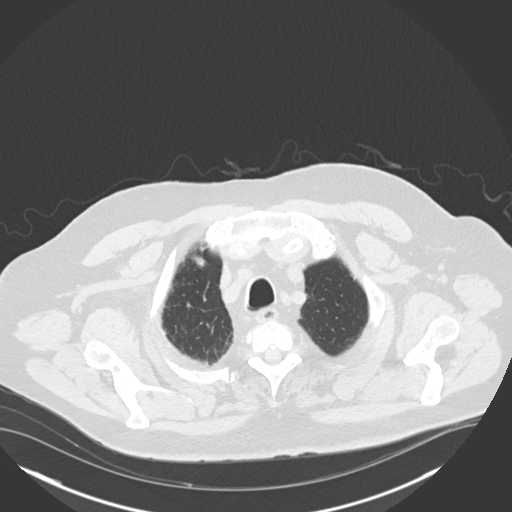
[im 147/160  lung]
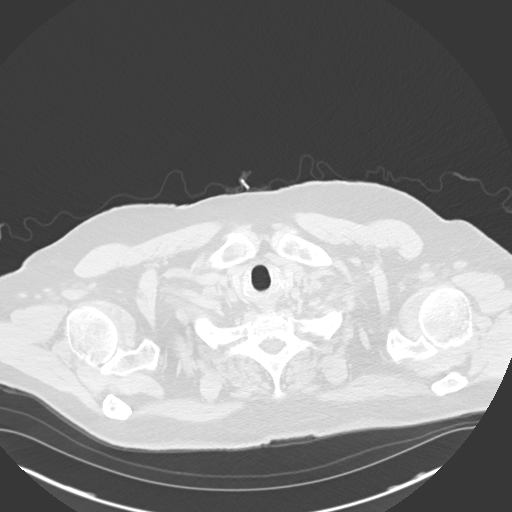

[Series 5: coronal · coronal · 0.62mm/px · 3 of 145 slices shown]
[im 29/145  lung]
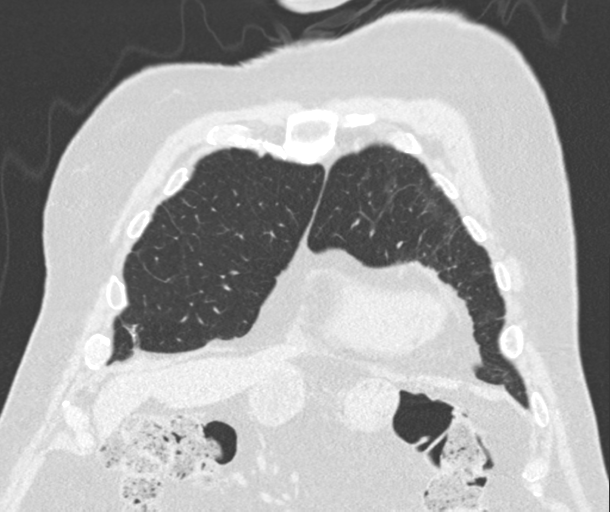
[im 58/145  lung]
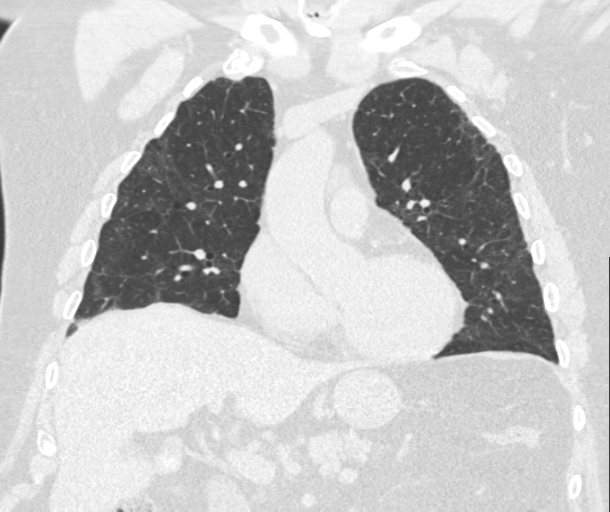
[im 87/145  lung]
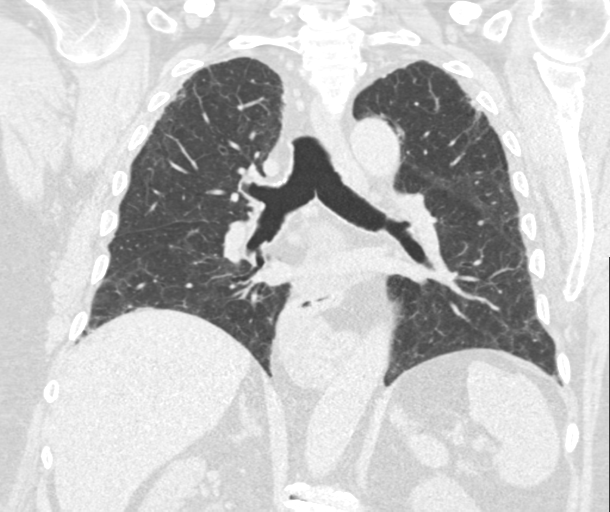

[15 of 36 positions shown; findings below may reference images not displayed]

FINDINGS: Cardiovascular: Normal heart size. Trace pericardial effusion. The
thoracic aorta is normal in caliber. Mild atherosclerotic plaque of
the thoracic aorta. Left main coronary artery calcifications.

Mediastinum/Nodes: No enlarged mediastinal or axillary lymph nodes.
Thyroid gland, trachea, and esophagus demonstrate no significant
findings. Small to moderate volume hiatal hernia.

Lungs/Pleura: Mild peripheral reticulations. Slightly less prominent
scarring versus tiny cluster of pulmonary micronodule of the right
middle lobe. Stable subpleural left lower lobe 0.9 x 1.1 cm
pulmonary nodule. No focal consolidation. Left subpleural pulmonary
micronodules along the major fissure a slightly triangular and
likely represent intrapulmonary lymph nodes. Pulmonary mass. No
pleural effusion. No pneumothorax.

Upper Abdomen: Query nodular hepatic contour.  Cholelithiasis.

Musculoskeletal:

No chest wall abnormality.

No suspicious lytic or blastic osseous lesions. No acute displaced
fracture. Multilevel degenerative changes of the spine.
IMPRESSION: 1. Slightly less prominent scarring versus tiny cluster of pulmonary
micronodule of the right middle lobe.
2. Stable subpleural left lower lobe 0.9 x 1.1 cm pulmonary nodule.
3. Mild peripheral reticulations within the lungs suggestive of
pulmonary fibrosis.
4. Small to moderate volume hiatal hernia.
5. Cholelithiasis.
6. Query nodular hepatic contour.
7. Aortic Atherosclerosis ([2T]-[2T]) including left main coronary
artery calcification.

## 2022-02-23 NOTE — Progress Notes (Signed)
Follow up CT on lung nodule. Previous coronary CT revealed a 29m nodule in left lower lobe of lung, current CT shows the size is stable at 9 x194min size. Possible pulmonary fibrosis. Also mentioned scarring vs tiny clusters of micronodules.  Recommend referral to pulmonology to follow up. Will forward to Dr. ScGardiner Rhymeo review as well

## 2022-03-01 ENCOUNTER — Other Ambulatory Visit: Payer: Self-pay

## 2022-03-01 DIAGNOSIS — R918 Other nonspecific abnormal finding of lung field: Secondary | ICD-10-CM

## 2022-03-01 NOTE — Progress Notes (Signed)
Discussed with Dr. Gardiner Rhyme. Please refer the patient to pulmonology, although the size of nodule remain stable, would recommend pulmonology evaluation

## 2022-03-10 ENCOUNTER — Ambulatory Visit: Payer: Medicare HMO | Admitting: Emergency Medicine

## 2022-03-10 ENCOUNTER — Encounter: Payer: Self-pay | Admitting: Emergency Medicine

## 2022-03-10 DIAGNOSIS — J439 Emphysema, unspecified: Secondary | ICD-10-CM

## 2022-03-10 DIAGNOSIS — R0609 Other forms of dyspnea: Secondary | ICD-10-CM

## 2022-03-10 DIAGNOSIS — R918 Other nonspecific abnormal finding of lung field: Secondary | ICD-10-CM

## 2022-03-10 DIAGNOSIS — Z6834 Body mass index (BMI) 34.0-34.9, adult: Secondary | ICD-10-CM

## 2022-03-10 DIAGNOSIS — J441 Chronic obstructive pulmonary disease with (acute) exacerbation: Secondary | ICD-10-CM | POA: Diagnosis not present

## 2022-03-10 DIAGNOSIS — J438 Other emphysema: Secondary | ICD-10-CM | POA: Diagnosis not present

## 2022-03-10 DIAGNOSIS — E6609 Other obesity due to excess calories: Secondary | ICD-10-CM

## 2022-03-10 MED ORDER — ALBUTEROL SULFATE HFA 108 (90 BASE) MCG/ACT IN AERS: 2.0000 | INHALATION_SPRAY | RESPIRATORY_TRACT | 4 refills | Status: AC | PRN

## 2022-03-10 NOTE — Addendum Note (Signed)
Addended by: Monna Fam L on: 03/10/2022 11:26 AM ? ? Modules accepted: Orders ? ?

## 2022-03-10 NOTE — Assessment & Plan Note (Signed)
Suspect that this is multifactorial.  He does have presumed COPD and is on good bronchodilator therapy.  Suspect there is a component of restriction due to obesity and deconditioning, but note he has some subpleural interstitial change on CT chest going back to 2020, minimal change on his most recent imaging.  Unclear cause, no history of autoimmune disease.  We will start with pulmonary function testing and walking oximetry today.  Consider ILD work-up ?

## 2022-03-10 NOTE — Patient Instructions (Addendum)
We will plan to repeat your CT scan of the chest in September 2023 to follow small pulmonary nodule. ?We will perform pulmonary function testing at your next office visit ?We will perform walking oximetry on room air today. ?Please continue your Spiriva 2 inhalations once daily ?Please continue Symbicort 2 puffs twice a day.  Rinse and gargle after using. ?We will refill your albuterol.  You can use 2 puffs up to every 4 hours if needed for shortness of breath, chest tightness, wheezing.  You may want to take 2 puffs about 10 minutes prior to exertion to see if this helps you ?Follow with Dr. Lamonte Sakai next available with full pulmonary function testing on the same day.  ? ?

## 2022-03-10 NOTE — Assessment & Plan Note (Signed)
Presumed COPD with history of tobacco use.  He is on scheduled bronchodilator therapy.  Does not have an albuterol right now we will refill this.  We will perform pulmonary function testing to quantify his degree of obstruction ?

## 2022-03-10 NOTE — Progress Notes (Addendum)
? ?Subjective:  ? ? [name], male    DOB: 02-13-1946, 76 y.o.   MRN: [id number] ? ?HPI ?76 year old former smoker (10-15 pack years, then pipe + cigars) with a history of cerebrovascular disease and CVA, hyperlipidemia, hypertension with diastolic dysfunction, chronic chest discomfort.  He underwent coronary calcium CT in 10/2021 that was reassuring from a cardiac standpoint.  This did identify right middle lobe and left lower lobe clustered nodular opacities and follow-up was recommended. ?Today he reports that he has exertional SOB, longstanding. Happens with walking 165f, yard work. He is on symbicort and spiriva - do seem to help him. Does not have albuterol right now. He has daily cough, intermitently productive. He has GERD that can be symptomatic, comes up at night when supine.  ?Singulair, Zyrtec.  ? ?CT chest coronary 11/03/2021 reviewed by me showed clustered nodular densities in the right middle lobe and a 9 mm left lower lobe pulmonary nodule. ? ?CT scan of the chest 02/20/2022 reviewed by me showed some peripheral reticulation, some less prominent scarring versus tiny pulmonary micronodular disease in the right middle lobe, stable subpleural left lower lobe 9 x 11 mm pulmonary nodule, small left subpleural micronodular change along the major fissure unchanged.  Small to moderate hiatal hernia ? ? ?Review of Systems ?As per HPI ? ?Past Medical History:  ?Diagnosis Date  ? Allergy   ? Arthritis   ? Asthma   ? Cataract   ? Headache   ? Stroke ([facility]   ?  ? ?Family History  ?Adopted: Yes  ?Problem Relation Age of Onset  ? Colon cancer Mother   ? Cirrhosis Father   ?  ? ?Social History  ? ?Socioeconomic History  ? Marital status: Married  ?  Spouse name: JBlanch Media ? Number of children: 2  ? Years of education: 173 ? Highest education level: GED or equivalent  ?Occupational History  ? Occupation: Retired  ?Tobacco Use  ? Smoking status: Former  ?  Types: Cigars  ? Smokeless tobacco: Current  ?   Types: Snuff  ? Tobacco comments:  ?  Pt states he has been quitting with smoking but is unsure when he quit.   ?Vaping Use  ? Vaping Use: Never used  ?Substance and Sexual Activity  ? Alcohol use: No  ? Drug use: No  ? Sexual activity: Yes  ?Other Topics Concern  ? Not on file  ?Social History Narrative  ? Lives at home with his wife.  ? Right-handed.  ? 12 cups coffee day.  ? ?Social Determinants of Health  ? ?Financial Resource Strain: Low Risk   ? Difficulty of Paying Living Expenses: Not hard at all  ?Food Insecurity: No Food Insecurity  ? Worried About RCharity fundraiserin the Last Year: Never true  ? Ran Out of Food in the Last Year: Never true  ?Transportation Needs: No Transportation Needs  ? Lack of Transportation (Medical): No  ? Lack of Transportation (Non-Medical): No  ?Physical Activity: Insufficiently Active  ? Days of Exercise per Week: 7 days  ? Minutes of Exercise per Session: 10 min  ?Stress: No Stress Concern Present  ? Feeling of Stress : Not at all  ?Social Connections: Moderately Isolated  ? Frequency of Communication with Friends and Family: More than three times a week  ? Frequency of Social Gatherings with Friends and Family: Once a week  ? Attends Religious Services: Never  ? Active Member of Clubs or Organizations:  No  ? Attends Archivist Meetings: Never  ? Marital Status: Married  ?Intimate Partner Violence: Not At Risk  ? Fear of Current or Ex-Partner: No  ? Emotionally Abused: No  ? Physically Abused: No  ? Sexually Abused: No  ?  ? ?Allergies  ?Allergen Reactions  ? Penicillins Swelling  ?  ? ?Outpatient Medications Prior to Visit  ?Medication Sig Dispense Refill  ? acetaminophen (TYLENOL) 325 MG tablet Take 650 mg by mouth every 6 (six) hours as needed.    ? aspirin EC 325 MG tablet Take 1 tablet (325 mg total) by mouth daily. 90 tablet 4  ? atorvastatin (LIPITOR) 10 MG tablet Take 1 tablet by mouth once daily 90 tablet 0  ? budesonide-formoterol (SYMBICORT) 160-4.5  MCG/ACT inhaler Inhale 2 puffs into the lungs 2 (two) times daily. 3 each 3  ? carvedilol (COREG) 3.125 MG tablet Take 1 tablet (3.125 mg total) by mouth 2 (two) times daily. 180 tablet 3  ? cetirizine (ZYRTEC) 10 MG tablet Take 1 tablet (10 mg total) by mouth daily. 90 tablet 5  ? Cyanocobalamin (B-12 PO) Take 1,000 mcg by mouth daily.    ? diclofenac Sodium (VOLTAREN) 1 % GEL Apply 2 g topically 4 (four) times daily. 350 g 3  ? doxazosin (CARDURA) 4 MG tablet TAKE 1 TABLET EVERY DAY 90 tablet 0  ? empagliflozin (JARDIANCE) 10 MG TABS tablet Take 1 tablet (10 mg total) by mouth daily before breakfast. 90 tablet 3  ? losartan (COZAAR) 25 MG tablet Take 1 tablet (25 mg total) by mouth daily. 90 tablet 3  ? meloxicam (MOBIC) 15 MG tablet TAKE 1 TABLET EVERY DAY 90 tablet 0  ? montelukast (SINGULAIR) 10 MG tablet Take 1 tablet (10 mg total) by mouth at bedtime. 90 tablet 1  ? Multiple Vitamins-Minerals (PRESERVISION AREDS 2 PO) Take 1 capsule by mouth 2 (two) times daily.    ? omeprazole (PRILOSEC) 20 MG capsule TAKE 1 CAPSULE TWICE DAILY BEFORE MEALS 180 capsule 1  ? spironolactone (ALDACTONE) 25 MG tablet Take 0.5 tablets (12.5 mg total) by mouth daily. 45 tablet 3  ? Tiotropium Bromide Monohydrate (SPIRIVA RESPIMAT) 2.5 MCG/ACT AERS Inhale 2 puffs into the lungs daily. 180 each 3  ? vitamin A 8000 UNIT capsule Take 8,000 Units by mouth daily.    ? albuterol (VENTOLIN HFA) 108 (90 Base) MCG/ACT inhaler Inhale 2 puffs into the lungs every 4 (four) hours as needed for wheezing or shortness of breath. (Patient not taking: Reported on 03/10/2022) 18 g 0  ? ?No facility-administered medications prior to visit.  ? ? ? ? ?   ?Objective:  ? Physical Exam ?Vitals:  ? 03/10/22 1033  ?BP: 128/68  ?Pulse: 66  ?Temp: 98.2 ?F (36.8 ?C)  ?TempSrc: Oral  ?SpO2: 95%  ?Weight: 253 lb (114.8 kg)  ?Height: '5\' 11"'$  (1.803 m)  ? ? ?Gen: Pleasant, well-nourished, in no distress,  normal affect ? ?ENT: No lesions,  mouth clear,  oropharynx  clear, no postnasal drip ? ?Neck: No JVD, no stridor ? ?Lungs: No use of accessory muscles, no crackles or wheezing on normal respiration, no wheeze on forced expiration ? ?Cardiovascular: RRR, heart sounds normal, no murmur or gallops, no peripheral edema ? ?Musculoskeletal: No deformities, no cyanosis or clubbing ? ?Neuro: alert, awake, non focal ? ?Skin: Warm, no lesions or rash ? ?   ?Assessment & Plan:  ? ?Dyspnea on exertion ?Suspect that this is multifactorial.  He does have  presumed COPD and is on good bronchodilator therapy.  Suspect there is a component of restriction due to obesity and deconditioning, but note he has some subpleural interstitial change on CT chest going back to 2020, minimal change on his most recent imaging.  Unclear cause, no history of autoimmune disease.  We will start with pulmonary function testing and walking oximetry today.  Consider ILD work-up ? ?COPD (chronic obstructive pulmonary disease) with emphysema ([place]) ?Presumed COPD with history of tobacco use.  He is on scheduled bronchodilator therapy.  Does not have an albuterol right now we will refill this.  We will perform pulmonary function testing to quantify his degree of obstruction ? ?Multiple nodules of lung ?He has had some reticulonodular subpleural disease documented going back to 2020.  Little change.  Most recent CTs from 10/2021 and now 02/20/2022 show a stable 9 x 11 mm left lower lobe subpleural nodule that will need to be followed.  We will plan to repeat in 6 months for interval stability.  Plan diagnostics if it is changing or if suspicion increases. ? ?Obesity ?He may merit an evaluation for obstructive sleep apnea.  Can consider this going forward ? ? ?Baltazar Apo, MD, PhD ?03/10/2022, 11:01 AM ?[facility] ?[phone] or if no answer before 7:00PM call [phone] ?For any issues after 7:00PM please call eLink [phone] ? ? ?Addendum ?Based on patient's walking oximetry, clinical  findings, I believe that he will benefit from supplemental oxygen at 2 L/min.  We will order this for him. ? ?Baltazar Apo, MD, PhD ?03/29/2022, 5:04 PM ? [facility] ?[phone] or

## 2022-03-10 NOTE — Assessment & Plan Note (Signed)
He may merit an evaluation for obstructive sleep apnea.  Can consider this going forward ?

## 2022-03-10 NOTE — Assessment & Plan Note (Signed)
He has had some reticulonodular subpleural disease documented going back to 2020.  Little change.  Most recent CTs from 10/2021 and now 02/20/2022 show a stable 9 x 11 mm left lower lobe subpleural nodule that will need to be followed.  We will plan to repeat in 6 months for interval stability.  Plan diagnostics if it is changing or if suspicion increases. ?

## 2022-03-14 ENCOUNTER — Other Ambulatory Visit: Payer: Self-pay | Admitting: Family Medicine

## 2022-03-20 DIAGNOSIS — G4730 Sleep apnea, unspecified: Secondary | ICD-10-CM | POA: Diagnosis not present

## 2022-03-20 DIAGNOSIS — R0683 Snoring: Secondary | ICD-10-CM | POA: Diagnosis not present

## 2022-03-24 ENCOUNTER — Other Ambulatory Visit: Payer: Self-pay | Admitting: Family Medicine

## 2022-03-24 DIAGNOSIS — J439 Emphysema, unspecified: Secondary | ICD-10-CM

## 2022-03-26 ENCOUNTER — Other Ambulatory Visit: Payer: Self-pay | Admitting: Cardiology

## 2022-03-28 ENCOUNTER — Encounter: Payer: Self-pay | Admitting: Emergency Medicine

## 2022-03-28 ENCOUNTER — Telehealth: Payer: Self-pay | Admitting: Emergency Medicine

## 2022-03-28 DIAGNOSIS — J9611 Chronic respiratory failure with hypoxia: Secondary | ICD-10-CM

## 2022-03-29 NOTE — Telephone Encounter (Signed)
I will go back and amend my 03/10/22 note, but I don't see the walking oximetry documented so I'm not sure the DME will accept this.  ?

## 2022-03-29 NOTE — Telephone Encounter (Signed)
Viewed office visit from 03/10/2022. It did not mention anything about the patient needing oxygen.  ? ?Dr. Lamonte Sakai, can you addend the note from 03/10/2022 to state he will benefit from 2L of O2? Thanks!  ?

## 2022-03-29 NOTE — Telephone Encounter (Signed)
Will fax office visit notes to [facility].  ?

## 2022-04-03 ENCOUNTER — Telehealth: Payer: Self-pay | Admitting: Emergency Medicine

## 2022-04-03 NOTE — Telephone Encounter (Signed)
Lm for Melissa with [employer].  ?

## 2022-04-04 NOTE — Telephone Encounter (Signed)
See note from 03/29/22.  ?

## 2022-04-05 NOTE — Telephone Encounter (Signed)
I have sent a community msg to [employer] to check on the status of the portable oxygen for you. I will reach out to you once we hear something. Have a great day! ?

## 2022-04-12 DIAGNOSIS — E669 Obesity, unspecified: Secondary | ICD-10-CM | POA: Diagnosis not present

## 2022-04-12 DIAGNOSIS — R918 Other nonspecific abnormal finding of lung field: Secondary | ICD-10-CM | POA: Diagnosis not present

## 2022-04-12 DIAGNOSIS — R0609 Other forms of dyspnea: Secondary | ICD-10-CM | POA: Diagnosis not present

## 2022-04-12 DIAGNOSIS — J439 Emphysema, unspecified: Secondary | ICD-10-CM | POA: Diagnosis not present

## 2022-04-20 NOTE — Telephone Encounter (Signed)
I reviewed the patient's ONO on RA done 03/20/22.   Confirms that he needs to wear oxygen at night while sleeping. Please ask him if he is doing so. If not then we will need to make sure he has a concentrator, starts 2L/min while sleeping.

## 2022-05-03 DIAGNOSIS — H401124 Primary open-angle glaucoma, left eye, indeterminate stage: Secondary | ICD-10-CM | POA: Diagnosis not present

## 2022-05-09 ENCOUNTER — Ambulatory Visit (INDEPENDENT_AMBULATORY_CARE_PROVIDER_SITE_OTHER): Payer: Medicare HMO | Admitting: Emergency Medicine

## 2022-05-09 ENCOUNTER — Ambulatory Visit: Payer: Medicare HMO | Admitting: Emergency Medicine

## 2022-05-09 ENCOUNTER — Encounter: Payer: Self-pay | Admitting: Emergency Medicine

## 2022-05-09 VITALS — BP 102/74 | HR 79 | Temp 98.4°F | Ht 67.0 in | Wt 248.2 lb

## 2022-05-09 DIAGNOSIS — R0609 Other forms of dyspnea: Secondary | ICD-10-CM

## 2022-05-09 DIAGNOSIS — E6609 Other obesity due to excess calories: Secondary | ICD-10-CM

## 2022-05-09 DIAGNOSIS — G4733 Obstructive sleep apnea (adult) (pediatric): Secondary | ICD-10-CM

## 2022-05-09 DIAGNOSIS — J438 Other emphysema: Secondary | ICD-10-CM

## 2022-05-09 DIAGNOSIS — R918 Other nonspecific abnormal finding of lung field: Secondary | ICD-10-CM

## 2022-05-09 DIAGNOSIS — J9611 Chronic respiratory failure with hypoxia: Secondary | ICD-10-CM | POA: Diagnosis not present

## 2022-05-09 LAB — PULMONARY FUNCTION TEST
DL/VA % pred: 105 %
DL/VA: 4.19 ml/min/mmHg/L
DLCO cor % pred: 83 %
DLCO cor: 19.4 ml/min/mmHg
DLCO unc % pred: 83 %
DLCO unc: 19.4 ml/min/mmHg
FEF 25-75 Post: 3.01 L/sec
FEF 25-75 Pre: 2.75 L/sec
FEF2575-%Change-Post: 9 %
FEF2575-%Pred-Post: 154 %
FEF2575-%Pred-Pre: 141 %
FEV1-%Change-Post: 2 %
FEV1-%Pred-Post: 100 %
FEV1-%Pred-Pre: 98 %
FEV1-Post: 2.73 L
FEV1-Pre: 2.67 L
FEV1FVC-%Change-Post: 2 %
FEV1FVC-%Pred-Pre: 111 %
FEV6-%Change-Post: 0 %
FEV6-%Pred-Post: 93 %
FEV6-%Pred-Pre: 93 %
FEV6-Post: 3.31 L
FEV6-Pre: 3.31 L
FEV6FVC-%Pred-Post: 107 %
FEV6FVC-%Pred-Pre: 107 %
FVC-%Change-Post: 0 %
FVC-%Pred-Post: 87 %
FVC-%Pred-Pre: 87 %
FVC-Post: 3.31 L
FVC-Pre: 3.31 L
Post FEV1/FVC ratio: 83 %
Post FEV6/FVC ratio: 100 %
Pre FEV1/FVC ratio: 81 %
Pre FEV6/FVC Ratio: 100 %
RV % pred: 125 %
RV: 3.06 L
TLC % pred: 94 %
TLC: 6.18 L

## 2022-05-09 NOTE — Assessment & Plan Note (Signed)
We are planning to repeat your CT scan of the chest without contrast in September. Follow Dr. Lamonte Sakai in September after your CT to review results

## 2022-05-09 NOTE — Addendum Note (Signed)
Addended by: Gavin Potters R on: 05/09/2022 02:31 PM   Modules accepted: Orders

## 2022-05-09 NOTE — Progress Notes (Signed)
PFT done today. 

## 2022-05-09 NOTE — Assessment & Plan Note (Signed)
We reviewed your pulmonary function testing today. Please continue your Symbicort and Spiriva as you have been taking them. Keep albuterol available to use 2 puffs if needed for shortness of breath, chest tightness, wheezing.

## 2022-05-09 NOTE — Progress Notes (Signed)
Subjective:    [name], male    DOB: 1946/09/03, 76 y.o.   MRN: [id number]  HPI 76 year old former smoker (10-15 pack years, then pipe + cigars) with a history of cerebrovascular disease and CVA, hyperlipidemia, hypertension with diastolic dysfunction, chronic chest discomfort.  Craig Hurley underwent coronary calcium CT in 10/2021 that was reassuring from a cardiac standpoint.  This did identify right middle lobe and left lower lobe clustered nodular opacities and follow-up was recommended. Today Craig Hurley reports that Craig Hurley has exertional SOB, longstanding. Happens with walking 183f, yard work. Craig Hurley is on symbicort and spiriva - do seem to help him. Does not have albuterol right now. Craig Hurley has daily cough, intermitently productive. Craig Hurley has GERD that can be symptomatic, comes up at night when supine.  Singulair, Zyrtec.   CT chest coronary 11/03/2021 reviewed by me showed clustered nodular densities in the right middle lobe and a 9 mm left lower lobe pulmonary nodule.  CT scan of the chest 02/20/2022 reviewed by me showed some peripheral reticulation, some less prominent scarring versus tiny pulmonary micronodular disease in the right middle lobe, stable subpleural left lower lobe 9 x 11 mm pulmonary nodule, small left subpleural micronodular change along the major fissure unchanged.  Small to moderate hiatal hernia   ROV 05/09/22 --76year old former smoker whom I have seen for right middle lobe and left lower lobe nodular opacities on CT (due for repeat in September), multifactorial dyspnea with presumed contribution from COPD and restrictive disease, deconditioning.  Currently managed on Symbicort and Spiriva.  Also on Singulair, Zyrtec, omeprazole.  [facility]ig [facility] a [facility]OC and using 2L/min w exertion.  We did ONO that indicated that Craig Hurley needed to wear oxygen at night (03/20/2022), started 2 L/min. Wife states that Craig Hurley does not snore, no witnessed  apneas.   Pulmonary function testing performed today and reviewed by me, shows grossly normal spirometry without a bronchodilator response, normal flow volume loop, normal lung volumes, normal diffusion capacity.   Review of Systems As per HPI      Objective:   Physical Exam Vitals:   05/09/22 1159  BP: 102/74  Pulse: 79  Temp: 98.4 F (36.9 C)  TempSrc: Oral  SpO2: 94%  Weight: 248 lb 3.2 oz (112.6 kg)  Height: '5\' 7"'$  (1.702 m)    Gen: Pleasant, well-nourished, in no distress,  normal affect  ENT: No lesions,  mouth clear,  oropharynx clear, no postnasal drip  Neck: No JVD, no stridor  Lungs: No use of accessory muscles, no crackles or wheezing on normal respiration, no wheeze on forced expiration  Cardiovascular: RRR, heart sounds normal, no murmur or gallops, no peripheral edema  Musculoskeletal: No deformities, no cyanosis or clubbing  Neuro: alert, awake, non focal  Skin: Warm, no lesions or rash     Assessment & Plan:   COPD (chronic obstructive pulmonary disease) with emphysema (HCC) We reviewed your pulmonary function testing today. Please continue your Symbicort and Spiriva as you have been taking them. Keep albuterol available to use 2 puffs if needed for shortness of breath, chest tightness, wheezing.   Multiple nodules of lung We are planning to repeat your CT scan of the chest without contrast in September. Follow Dr. BLamonte Sakaiin September after your CT to review results   Chronic respiratory failure with hypoxia ([facility] Craig Hurley qualified for O2 based on a 6-minute walk with [employer].  Craig Hurley has a portable oxygen concentrator, reliably  using 2 L/min with exertion.  Craig Hurley also had desaturations on overnight oximetry.  I think Craig Hurley needs a sleep study to determine whether CPAP is indicated versus getting him an O2 concentrator to use at night.  Use your oxygen at 2 L/min with exertion. Try to remain active, continue to walk and work on improving your stamina We will  arrange for a sleep study   Time spent 43 minutes  Baltazar Apo, MD, PhD 05/09/2022, 12:26 PM [facility] [phone] or if no answer before 7:00PM call [phone] For any issues after 7:00PM please call eLink [phone]

## 2022-05-09 NOTE — Assessment & Plan Note (Signed)
He qualified for O2 based on a 6-minute walk with [employer].  He has a portable oxygen concentrator, reliably using 2 L/min with exertion.  He also had desaturations on overnight oximetry.  I think he needs a sleep study to determine whether CPAP is indicated versus getting him an O2 concentrator to use at night.  Use your oxygen at 2 L/min with exertion. Try to remain active, continue to walk and work on improving your stamina We will arrange for a sleep study

## 2022-05-09 NOTE — Patient Instructions (Addendum)
We reviewed your pulmonary function testing today. Please continue your Symbicort and Spiriva as you have been taking them. Keep albuterol available to use 2 puffs if needed for shortness of breath, chest tightness, wheezing. Use your oxygen at 2 L/min with exertion. Try to remain active, continue to walk and work on improving your stamina We will arrange for a sleep study We are planning to repeat your CT scan of the chest without contrast in September. Follow Dr. Lamonte Sakai in September after your CT to review results

## 2022-05-10 ENCOUNTER — Ambulatory Visit (INDEPENDENT_AMBULATORY_CARE_PROVIDER_SITE_OTHER): Payer: Medicare HMO | Admitting: Family Medicine

## 2022-05-10 ENCOUNTER — Encounter: Payer: Self-pay | Admitting: Family Medicine

## 2022-05-10 VITALS — BP 113/66 | HR 66 | Temp 97.8°F | Ht 67.0 in | Wt 251.0 lb

## 2022-05-10 DIAGNOSIS — K219 Gastro-esophageal reflux disease without esophagitis: Secondary | ICD-10-CM

## 2022-05-10 DIAGNOSIS — J438 Other emphysema: Secondary | ICD-10-CM

## 2022-05-10 DIAGNOSIS — Z125 Encounter for screening for malignant neoplasm of prostate: Secondary | ICD-10-CM

## 2022-05-10 DIAGNOSIS — R7303 Prediabetes: Secondary | ICD-10-CM | POA: Diagnosis not present

## 2022-05-10 DIAGNOSIS — Z136 Encounter for screening for cardiovascular disorders: Secondary | ICD-10-CM | POA: Diagnosis not present

## 2022-05-10 LAB — BAYER DCA HB A1C WAIVED: HB A1C (BAYER DCA - WAIVED): 5.6 % (ref 4.8–5.6)

## 2022-05-10 MED ORDER — DOXAZOSIN MESYLATE 4 MG PO TABS
4.0000 mg | ORAL_TABLET | Freq: Every day | ORAL | 1 refills | Status: DC
Start: 2022-05-10 — End: 2022-11-07

## 2022-05-10 MED ORDER — MELOXICAM 15 MG PO TABS: 15.0000 mg | ORAL_TABLET | Freq: Every day | ORAL | 1 refills | Status: DC

## 2022-05-10 MED ORDER — OMEPRAZOLE 20 MG PO CPDR: DELAYED_RELEASE_CAPSULE | ORAL | 3 refills | Status: DC

## 2022-05-10 NOTE — Progress Notes (Signed)
BP 113/66   Pulse 66   Temp 97.8 F (36.6 C)   Ht _0  (1.702 m)   Wt 251 lb (113.9 kg)   SpO2 97%   BMI 39.31 kg/m    Subjective:   [name], male    DOB: [birth year], 76 y.o.   MRN: [id number]  HPI: Harjas Biggins is a 76 y.o. male presenting on 05/10/2022 for Medical Management of Chronic Issues, Prediabetes, and COPD   HPI COPD Patient is coming in for COPD recheck today.  He is currently on Spiriva and Singulair and Symbicort and albuterol.  He has a mild chronic cough but denies any major coughing spells or wheezing spells.  He has 0 nighttime symptoms per week and 0 daytime symptoms per week currently.   GERD Patient is currently on omeprazole.  She denies any major symptoms or abdominal pain or belching or burping. She denies any blood in her stool or lightheadedness or dizziness.   Prediabetes  patient comes in today for recheck of his diabetes. Patient has been currently taking no medication currently. Patient is currently on an ACE inhibitor/ARB. Patient has seen an ophthalmologist this year. Patient denies any issues with their feet. The symptom started onset as an adult GERD and obesity and CHF ARE RELATED TO DM   Relevant past medical, surgical, family and social history reviewed and updated as indicated. Interim medical history since our last visit reviewed. Allergies and medications reviewed and updated.  Review of Systems  Constitutional:  Negative for chills and fever.  Eyes:  Negative for visual disturbance.  Respiratory:  Negative for shortness of breath and wheezing.   Cardiovascular:  Negative for chest pain and leg swelling.  Musculoskeletal:  Negative for back pain and gait problem.  Skin:  Negative for rash.  Neurological:  Negative for dizziness, weakness, light-headedness and numbness.  All other systems reviewed and are negative.   Per HPI unless specifically indicated above   Allergies as of 05/10/2022       Reactions    Penicillins Swelling        Medication List        Accurate as of May 10, 2022  9:55 AM. If you have any questions, ask your nurse or doctor.          acetaminophen 325 MG tablet Commonly known as: TYLENOL Take 650 mg by mouth every 6 (six) hours as needed.   albuterol 108 (90 Base) MCG/ACT inhaler Commonly known as: VENTOLIN HFA Inhale 2 puffs into the lungs every 4 (four) hours as needed for wheezing or shortness of breath.   aspirin EC 325 MG tablet Take 1 tablet (325 mg total) by mouth daily.   atorvastatin 10 MG tablet Commonly known as: LIPITOR Take 1 tablet by mouth once daily   B-12 PO Take 1,000 mcg by mouth daily.   budesonide-formoterol 160-4.5 MCG/ACT inhaler Commonly known as: SYMBICORT Inhale 2 puffs into the lungs 2 (two) times daily.   carvedilol 3.125 MG tablet Commonly known as: COREG Take 1 tablet by mouth twice daily   cetirizine 10 MG tablet Commonly known as: ZYRTEC Take 1 tablet (10 mg total) by mouth daily.   diclofenac Sodium 1 % Gel Commonly known as: Voltaren Apply 2 g topically 4 (four) times daily.   doxazosin 4 MG tablet Commonly known as: CARDURA TAKE 1 TABLET EVERY DAY   empagliflozin 10 MG Tabs tablet Commonly known as: Jardiance Take 1 tablet (10 mg total) by mouth  daily before breakfast.   latanoprost 0.005 % ophthalmic solution Commonly known as: XALATAN Place 1 drop into the left eye at bedtime.   losartan 25 MG tablet Commonly known as: COZAAR Take 1 tablet (25 mg total) by mouth daily.   meloxicam 15 MG tablet Commonly known as: MOBIC TAKE 1 TABLET EVERY DAY   montelukast 10 MG tablet Commonly known as: SINGULAIR TAKE 1 TABLET AT BEDTIME   omeprazole 20 MG capsule Commonly known as: PRILOSEC TAKE 1 CAPSULE TWICE DAILY BEFORE MEALS   PRESERVISION AREDS 2 PO Take 1 capsule by mouth 2 (two) times daily.   Spiriva Respimat 2.5 MCG/ACT Aers Generic drug: Tiotropium Bromide Monohydrate Inhale 2  puffs into the lungs daily.   spironolactone 25 MG tablet Commonly known as: ALDACTONE Take 0.5 tablets (12.5 mg total) by mouth daily.   vitamin A 8000 UNIT capsule Take 8,000 Units by mouth daily.         Objective:   BP 113/66   Pulse 66   Temp 97.8 F (36.6 C)   Ht _0  (1.702 m)   Wt 251 lb (113.9 kg)   SpO2 97%   BMI 39.31 kg/m   Wt Readings from Last 3 Encounters:  05/10/22 251 lb (113.9 kg)  05/09/22 248 lb 3.2 oz (112.6 kg)  03/10/22 253 lb (114.8 kg)    Physical Exam Vitals and nursing note reviewed.  Constitutional:      General: He is not in acute distress.    Appearance: He is well-developed. He is not diaphoretic.  Eyes:     General: No scleral icterus.    Conjunctiva/sclera: Conjunctivae normal.  Neck:     Thyroid: No thyromegaly.  Cardiovascular:     Rate and Rhythm: Normal rate and regular rhythm.     Heart sounds: Normal heart sounds. No murmur heard. Pulmonary:     Effort: Pulmonary effort is normal. No respiratory distress.     Breath sounds: Normal breath sounds. No wheezing.  Musculoskeletal:        General: No swelling. Normal range of motion.     Cervical back: Neck supple.  Lymphadenopathy:     Cervical: No cervical adenopathy.  Skin:    General: Skin is warm and dry.     Findings: No rash.  Neurological:     Mental Status: He is alert and oriented to person, place, and time.     Coordination: Coordination normal.  Psychiatric:        Behavior: Behavior normal.       Assessment & Plan:   Problem List Items Addressed This Visit       Respiratory   COPD (chronic obstructive pulmonary disease) with emphysema (HCC)     Digestive   GERD (gastroesophageal reflux disease)     Other   Prediabetes - Primary   Relevant Orders   CBC with Differential/Platelet   CMP14+EGFR   Lipid panel   Bayer DCA Hb A1c Waived   PSA, total and free   Other Visit Diagnoses     Prostate cancer screening       Relevant Orders   PSA,  total and free     A1c looks good at 5.6, no changes blood pressure looks good.  Continue current medicine we will check blood work    Follow up plan: Return in about 6 months (around 11/09/2022), or if symptoms worsen or fail to improve, for CHF and prediabetes and COPD.  Counseling provided for all of the vaccine  components Orders Placed This Encounter  Procedures   CBC with Differential/Platelet   CMP14+EGFR   Lipid panel   Bayer DCA Hb A1c Waived   PSA, total and free    Caryl Pina, MD [facility] 05/10/2022, 9:55 AM

## 2022-05-11 LAB — CBC WITH DIFFERENTIAL/PLATELET
Basophils Absolute: 0.1 10*3/uL (ref 0.0–0.2)
Basos: 1 %
EOS (ABSOLUTE): 0.1 10*3/uL (ref 0.0–0.4)
Eos: 1 %
Hematocrit: 44.2 % (ref 37.5–51.0)
Hemoglobin: 15.1 g/dL (ref 13.0–17.7)
Immature Grans (Abs): 0.1 10*3/uL (ref 0.0–0.1)
Immature Granulocytes: 1 %
Lymphocytes Absolute: 1.4 10*3/uL (ref 0.7–3.1)
Lymphs: 17 %
MCH: 28.2 pg (ref 26.6–33.0)
MCHC: 34.2 g/dL (ref 31.5–35.7)
MCV: 83 fL (ref 79–97)
Monocytes Absolute: 0.7 10*3/uL (ref 0.1–0.9)
Monocytes: 9 %
Neutrophils Absolute: 5.9 10*3/uL (ref 1.4–7.0)
Neutrophils: 71 %
Platelets: 180 10*3/uL (ref 150–450)
RBC: 5.35 x10E6/uL (ref 4.14–5.80)
RDW: 14.8 % (ref 11.6–15.4)
WBC: 8.3 10*3/uL (ref 3.4–10.8)

## 2022-05-11 LAB — LIPID PANEL
Chol/HDL Ratio: 2.6 ratio (ref 0.0–5.0)
Cholesterol, Total: 125 mg/dL (ref 100–199)
HDL: 48 mg/dL (ref >39)
LDL Chol Calc (NIH): 62 mg/dL (ref 0–99)
Triglycerides: 71 mg/dL (ref 0–149)
VLDL Cholesterol Cal: 15 mg/dL (ref 5–40)

## 2022-05-11 LAB — CMP14+EGFR
ALT: 12 IU/L (ref 0–44)
AST: 11 IU/L (ref 0–40)
Albumin/Globulin Ratio: 2 (ref 1.2–2.2)
Albumin: 3.9 g/dL (ref 3.7–4.7)
Alkaline Phosphatase: 93 IU/L (ref 44–121)
BUN/Creatinine Ratio: 18 (ref 10–24)
BUN: 17 mg/dL (ref 8–27)
Bilirubin Total: 0.3 mg/dL (ref 0.0–1.2)
CO2: 19 mmol/L — ABNORMAL LOW (ref 20–29)
Calcium: 8.8 mg/dL (ref 8.6–10.2)
Chloride: 105 mmol/L (ref 96–106)
Creatinine, Ser: 0.94 mg/dL (ref 0.76–1.27)
Globulin, Total: 2 g/dL (ref 1.5–4.5)
Glucose: 106 mg/dL — ABNORMAL HIGH (ref 70–99)
Potassium: 4.3 mmol/L (ref 3.5–5.2)
Sodium: 140 mmol/L (ref 134–144)
Total Protein: 5.9 g/dL — ABNORMAL LOW (ref 6.0–8.5)
eGFR: 84 mL/min/{1.73_m2} (ref >59)

## 2022-05-11 LAB — PSA, TOTAL AND FREE
PSA, Free Pct: 28.1 %
PSA, Free: 0.45 ng/mL
Prostate Specific Ag, Serum: 1.6 ng/mL (ref 0.0–4.0)

## 2022-05-13 DIAGNOSIS — J439 Emphysema, unspecified: Secondary | ICD-10-CM | POA: Diagnosis not present

## 2022-05-13 DIAGNOSIS — E669 Obesity, unspecified: Secondary | ICD-10-CM | POA: Diagnosis not present

## 2022-05-13 DIAGNOSIS — R918 Other nonspecific abnormal finding of lung field: Secondary | ICD-10-CM | POA: Diagnosis not present

## 2022-05-13 DIAGNOSIS — R0609 Other forms of dyspnea: Secondary | ICD-10-CM | POA: Diagnosis not present

## 2022-05-15 ENCOUNTER — Telehealth: Payer: Self-pay | Admitting: Family Medicine

## 2022-05-15 NOTE — Telephone Encounter (Signed)
Patients wife states that he is seeing retina specialist next week and she was concerned that she needed prior authorization. I advised her that if any procedures are sone that the specialist would obtain those authorizations.   Patient's wife verbalized understanding

## 2022-05-22 ENCOUNTER — Encounter: Payer: Self-pay | Admitting: Family Medicine

## 2022-05-24 DIAGNOSIS — H40052 Ocular hypertension, left eye: Secondary | ICD-10-CM | POA: Diagnosis not present

## 2022-05-24 DIAGNOSIS — H353132 Nonexudative age-related macular degeneration, bilateral, intermediate dry stage: Secondary | ICD-10-CM | POA: Diagnosis not present

## 2022-05-26 ENCOUNTER — Ambulatory Visit (INDEPENDENT_AMBULATORY_CARE_PROVIDER_SITE_OTHER): Payer: Medicare HMO | Admitting: Family Medicine

## 2022-05-26 ENCOUNTER — Encounter: Payer: Self-pay | Admitting: Family Medicine

## 2022-05-26 VITALS — BP 113/73 | HR 61 | Temp 97.7°F | Ht 67.0 in | Wt 252.0 lb

## 2022-05-26 DIAGNOSIS — S6492XA Injury of unspecified nerve at wrist and hand level of left arm, initial encounter: Secondary | ICD-10-CM

## 2022-05-26 MED ORDER — METHYLPREDNISOLONE ACETATE 80 MG/ML IJ SUSP
80.0000 mg | Freq: Once | INTRAMUSCULAR | Status: AC
  Administered 2022-05-26: 80 mg via INTRAMUSCULAR

## 2022-05-26 NOTE — Progress Notes (Signed)
BP 113/73   Pulse 61   Temp 97.7 F (36.5 C)   Ht '5\' 7"'$  (1.702 m)   Wt 252 lb (114.3 kg)   SpO2 96%   BMI 39.47 kg/m    Subjective:   [name], male    DOB: October 04, 1946, 76 y.o.   MRN: [id number]  HPI: Craig Hurley is a 76 y.o. male presenting on 05/26/2022 for Numbness (Left hand)   HPI Left hand numbness,  Patient is coming in with left hand numbness that has been going on for the past week.  He says he feels a numbness in certain parts of his hand and cannot decipher if it is on the middle part on the outside part and then sometimes it will come up into his arm.  He says it is worse when he is standing up and has his arm hanging down and he will feel the numbness shooting more down to his hand at that point.  He denies any weakness or loss of grip strength.  He has a history of carpal tunnel surgery on both hands many many years ago.  Relevant past medical, surgical, family and social history reviewed and updated as indicated. Interim medical history since our last visit reviewed. Allergies and medications reviewed and updated.  Review of Systems  Constitutional:  Negative for chills and fever.  Respiratory:  Negative for shortness of breath and wheezing.   Cardiovascular:  Negative for chest pain and leg swelling.  Musculoskeletal:  Positive for arthralgias.  Skin:  Negative for rash.  Neurological:  Positive for numbness. Negative for weakness.  All other systems reviewed and are negative.   Per HPI unless specifically indicated above   Allergies as of 05/26/2022       Reactions   Penicillins Swelling        Medication List        Accurate as of May 26, 2022  4:37 PM. If you have any questions, ask your nurse or doctor.          acetaminophen 325 MG tablet Commonly known as: TYLENOL Take 650 mg by mouth every 6 (six) hours as needed.   albuterol 108 (90 Base) MCG/ACT inhaler Commonly known as: VENTOLIN HFA Inhale 2 puffs  into the lungs every 4 (four) hours as needed for wheezing or shortness of breath.   aspirin EC 325 MG tablet Take 1 tablet (325 mg total) by mouth daily.   atorvastatin 10 MG tablet Commonly known as: LIPITOR Take 1 tablet by mouth once daily   B-12 PO Take 1,000 mcg by mouth daily.   budesonide-formoterol 160-4.5 MCG/ACT inhaler Commonly known as: SYMBICORT Inhale 2 puffs into the lungs 2 (two) times daily.   carvedilol 3.125 MG tablet Commonly known as: COREG Take 1 tablet by mouth twice daily   cetirizine 10 MG tablet Commonly known as: ZYRTEC Take 1 tablet (10 mg total) by mouth daily.   diclofenac Sodium 1 % Gel Commonly known as: Voltaren Apply 2 g topically 4 (four) times daily.   doxazosin 4 MG tablet Commonly known as: CARDURA Take 1 tablet (4 mg total) by mouth daily.   empagliflozin 10 MG Tabs tablet Commonly known as: Jardiance Take 1 tablet (10 mg total) by mouth daily before breakfast.   latanoprost 0.005 % ophthalmic solution Commonly known as: XALATAN Place 1 drop into the left eye at bedtime.   losartan 25 MG tablet Commonly known as: COZAAR Take 1 tablet (25 mg total) by  mouth daily.   meloxicam 15 MG tablet Commonly known as: MOBIC Take 1 tablet (15 mg total) by mouth daily.   montelukast 10 MG tablet Commonly known as: SINGULAIR TAKE 1 TABLET AT BEDTIME   omeprazole 20 MG capsule Commonly known as: PRILOSEC TAKE 1 CAPSULE TWICE DAILY BEFORE MEALS   PRESERVISION AREDS 2 PO Take 1 capsule by mouth 2 (two) times daily.   Spiriva Respimat 2.5 MCG/ACT Aers Generic drug: Tiotropium Bromide Monohydrate Inhale 2 puffs into the lungs daily.   spironolactone 25 MG tablet Commonly known as: ALDACTONE Take 0.5 tablets (12.5 mg total) by mouth daily.   vitamin A 8000 UNIT capsule Take 8,000 Units by mouth daily.         Objective:   BP 113/73   Pulse 61   Temp 97.7 F (36.5 C)   Ht '5\' 7"'$  (1.702 m)   Wt 252 lb (114.3 kg)   SpO2  96%   BMI 39.47 kg/m   Wt Readings from Last 3 Encounters:  05/26/22 252 lb (114.3 kg)  05/10/22 251 lb (113.9 kg)  05/09/22 248 lb 3.2 oz (112.6 kg)    Physical Exam Vitals and nursing note reviewed.  Constitutional:      Appearance: Normal appearance.  Musculoskeletal:     Left shoulder: Normal. Normal range of motion.     Left wrist: No swelling, deformity, tenderness, bony tenderness or crepitus. Normal range of motion.     Left hand: No swelling or tenderness. Normal range of motion. Normal strength. Decreased sensation of the median distribution and radial distribution.     Cervical back: No swelling, signs of trauma, spasms, tenderness or bony tenderness. No pain with movement. Normal range of motion.  Neurological:     Mental Status: He is alert.       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  Follow up plan: Return if symptoms worsen or fail to improve.  Counseling provided for all of the vaccine components No orders of the defined types were placed in this encounter.   Caryl Pina, MD [facility] 05/26/2022, 4:37 PM

## 2022-05-29 ENCOUNTER — Other Ambulatory Visit: Payer: Self-pay | Admitting: Family Medicine

## 2022-05-29 DIAGNOSIS — S6492XA Injury of unspecified nerve at wrist and hand level of left arm, initial encounter: Secondary | ICD-10-CM

## 2022-05-29 NOTE — Progress Notes (Signed)
LM to RC  

## 2022-05-29 NOTE — Progress Notes (Signed)
Patient informed via mychart message

## 2022-05-29 NOTE — Progress Notes (Signed)
Left hand numbness is persistent, may need further testing such as possible EMG to see if the cause is carpal tunnel in his hand or if it is related to something from nerve entrapment in his shoulder or his neck.  We will place referral to neurology

## 2022-05-29 NOTE — Telephone Encounter (Signed)
Spoke with wife Blanch Media (per DPR) and pt is not currently using O2 at night. Wife did verify that they had a concentrator and would be begin wearing O2 at night. Wife also informed me that pt is scheduled for sleep study at the end of the month. Nothing further needed at this time.

## 2022-06-05 ENCOUNTER — Other Ambulatory Visit: Payer: Self-pay | Admitting: Cardiology

## 2022-06-05 ENCOUNTER — Other Ambulatory Visit: Payer: Self-pay | Admitting: Family Medicine

## 2022-06-05 DIAGNOSIS — K219 Gastro-esophageal reflux disease without esophagitis: Secondary | ICD-10-CM

## 2022-06-12 DIAGNOSIS — H40052 Ocular hypertension, left eye: Secondary | ICD-10-CM | POA: Diagnosis not present

## 2022-06-19 ENCOUNTER — Ambulatory Visit: Payer: Medicare HMO | Attending: Emergency Medicine | Admitting: Pulmonary Disease

## 2022-06-19 DIAGNOSIS — G4733 Obstructive sleep apnea (adult) (pediatric): Secondary | ICD-10-CM

## 2022-06-19 DIAGNOSIS — J449 Chronic obstructive pulmonary disease, unspecified: Secondary | ICD-10-CM | POA: Insufficient documentation

## 2022-06-19 DIAGNOSIS — Z9981 Dependence on supplemental oxygen: Secondary | ICD-10-CM | POA: Diagnosis not present

## 2022-06-19 DIAGNOSIS — R0683 Snoring: Secondary | ICD-10-CM | POA: Diagnosis not present

## 2022-06-19 DIAGNOSIS — G4710 Hypersomnia, unspecified: Secondary | ICD-10-CM | POA: Insufficient documentation

## 2022-06-19 DIAGNOSIS — G4736 Sleep related hypoventilation in conditions classified elsewhere: Secondary | ICD-10-CM | POA: Insufficient documentation

## 2022-06-20 DIAGNOSIS — G5622 Lesion of ulnar nerve, left upper limb: Secondary | ICD-10-CM | POA: Diagnosis not present

## 2022-06-20 DIAGNOSIS — M542 Cervicalgia: Secondary | ICD-10-CM | POA: Diagnosis not present

## 2022-06-21 DIAGNOSIS — G4733 Obstructive sleep apnea (adult) (pediatric): Secondary | ICD-10-CM

## 2022-06-21 NOTE — Procedures (Signed)
Patient Name: Craig Hurley, Longest Date: 06/19/2022 Gender: Male D.O.B: 1946-03-13 Age (years): 75 Referring Provider: Baltazar Apo Height (inches): 38 Interpreting Physician: Kara Mead MD, ABSM Weight (lbs): 255 RPSGT: Rosebud Poles BMI: 40 MRN: [id number] Neck Size: 18.50 <br> <br> CLINICAL INFORMATION Sleep Study Type: NPSG    Indication for sleep study: COPD on O2, hypersomnolence, snoring, nocturnal desaturations    Epworth Sleepiness Score: 8    SLEEP STUDY TECHNIQUE As per the AASM Manual for the Scoring of Sleep and Associated Events v2.3 (April 2016) with a hypopnea requiring 4% desaturations.  The channels recorded and monitored were frontal, central and occipital EEG, electrooculogram (EOG), submentalis EMG (chin), nasal and oral airflow, thoracic and abdominal wall motion, anterior tibialis EMG, snore microphone, electrocardiogram, and pulse oximetry.  MEDICATIONS Medications self-administered by patient taken the night of the study : N/A  SLEEP ARCHITECTURE The study was initiated at 9:43:37 PM and ended at 4:30:27 AM.  Sleep onset time was 45.8 minutes and the sleep efficiency was 59.5%. The total sleep time was 242.1 minutes.  Stage REM latency was 253.0 minutes.  The patient spent 5.16% of the night in stage N1 sleep, 38.86% in stage N2 sleep, 41.10% in stage N3 and 14.9% in REM.  Alpha intrusion was absent.  Supine sleep was 63.65%.  RESPIRATORY PARAMETERS The overall apnea/hypopnea index (AHI) was 40.9 per hour. There were 0 total apneas, including 0 obstructive, 0 central and 0 mixed apneas. There were 165 hypopneas and 0 RERAs.  The AHI during Stage REM sleep was 71.7 per hour.  AHI while supine was 63.1 per hour.  The mean oxygen saturation was 89.50%. The minimum SpO2 during sleep was 76.00%.  soft snoring was noted during this study.  CARDIAC DATA The 2 lead EKG demonstrated sinus rhythm. The mean heart rate was 61.81 beats per  minute. Other EKG findings include: PVCs.   LEG MOVEMENT DATA The total PLMS were 0 with a resulting PLMS index of 0.00. Associated arousal with leg movement index was 0.0 .  IMPRESSIONS - Severe obstructive sleep apnea occurred during this study (AHI = 40.9/h). Events were mostly noted in supine sleep in the second half of the study, hence night could not be split - Moderate oxygen desaturation was noted during this study (Min O2 = 76.00%). - The patient snored with soft snoring volume. - EKG findings include PVCs. - Clinically significant periodic limb movements did not occur during sleep. No significant associated arousals.   DIAGNOSIS - Obstructive Sleep Apnea (G47.33) - Nocturnal Hypoxemia (G47.36)   RECOMMENDATIONS - Therapeutic CPAP titration to determine optimal pressure required to alleviate sleep disordered breathing & to assess whether O2 is required - Positional therapy avoiding supine position during sleep. - Avoid alcohol, sedatives and other CNS depressants that may worsen sleep apnea and disrupt normal sleep architecture. - Sleep hygiene should be reviewed to assess factors that may improve sleep quality. - Weight management and regular exercise should be initiated or continued if appropriate.   Kara Mead MD Board Certified in [place]

## 2022-06-22 DIAGNOSIS — M5412 Radiculopathy, cervical region: Secondary | ICD-10-CM | POA: Diagnosis not present

## 2022-06-22 DIAGNOSIS — G5602 Carpal tunnel syndrome, left upper limb: Secondary | ICD-10-CM | POA: Diagnosis not present

## 2022-06-26 ENCOUNTER — Other Ambulatory Visit: Payer: Self-pay | Admitting: Family Medicine

## 2022-07-06 ENCOUNTER — Telehealth: Payer: Self-pay | Admitting: Emergency Medicine

## 2022-07-06 DIAGNOSIS — G4733 Obstructive sleep apnea (adult) (pediatric): Secondary | ICD-10-CM

## 2022-07-06 DIAGNOSIS — M5412 Radiculopathy, cervical region: Secondary | ICD-10-CM | POA: Diagnosis not present

## 2022-07-07 NOTE — Telephone Encounter (Signed)
See result from split night on 06/19/22.   Craig Hurley, Craig Hurley  07/05/2022 12:05 PM EDT Back to Top    Dr Lamonte Sakai, I called the patient to let him know about the sleep study and your recommendations. The patient and his wife decided that they do want to try the CPAP but would like to know if he can have the 2L O2   combined with the order.   RB, can you please advise?

## 2022-07-10 NOTE — Telephone Encounter (Signed)
Ok to send order for him to have 2L/min bled into his CPAP qhs

## 2022-07-10 NOTE — Telephone Encounter (Signed)
Called and spoke to patient about cpap order and oxygen. Patient verbalized understanding. I advised patient that 31-90 days after he starts cpap he needs to make an appt with [place] for compliance so insurance will continue to pay for machine. Nothing further needed

## 2022-07-13 DIAGNOSIS — G5602 Carpal tunnel syndrome, left upper limb: Secondary | ICD-10-CM | POA: Diagnosis not present

## 2022-07-13 DIAGNOSIS — M5412 Radiculopathy, cervical region: Secondary | ICD-10-CM | POA: Diagnosis not present

## 2022-07-19 ENCOUNTER — Encounter: Payer: Self-pay | Admitting: Cardiology

## 2022-07-20 ENCOUNTER — Telehealth: Payer: Self-pay | Admitting: *Deleted

## 2022-07-20 NOTE — Telephone Encounter (Signed)
I s/w the pt and his wife who are both agreeable to tele appt pre op tomorrow 8/25 @ 9:20. Med rec and consent are done.

## 2022-07-20 NOTE — Telephone Encounter (Signed)
   Pre-operative Risk Assessment    Patient Name: Craig Hurley  DOB: December 02, 1945 MRN: [id number]      Request for Surgical Clearance    Procedure:   CARPAL TUNNEL REVISION LEFT HAND  Date of Surgery:  Clearance 07/24/22                                 Surgeon:  DR. [facility] Group or Practice Name:  [facility] Phone number:  [phone] Fax number:  [phone]   Type of Clearance Requested:   - Pharmacy:  Hold Aspirin NOT INDICATED   Type of Anesthesia:  Not Indicated   Additional requests/questions:    Astrid Divine   07/20/2022, 7:17 AM

## 2022-07-20 NOTE — Telephone Encounter (Signed)
I s/w the pt and his wife who are both agreeable to tele appt pre op tomorrow 8/25 @ 9:20. Med rec and consent are done.     Patient Consent for Virtual Visit        Andie Mortimer has provided verbal consent on 07/20/2022 for a virtual visit (video or telephone).   CONSENT FOR VIRTUAL VISIT FOR:  Craig Hurley  By participating in this virtual visit I agree to the following:  I hereby voluntarily request, consent and authorize [facility] and its employed or contracted physicians, physician assistants, nurse practitioners or other licensed health care professionals (the Practitioner), to provide me with telemedicine health care services (the "Services") as deemed necessary by the treating Practitioner. I acknowledge and consent to receive the Services by the Practitioner via telemedicine. I understand that the telemedicine visit will involve communicating with the Practitioner through live audiovisual communication technology and the disclosure of certain medical information by electronic transmission. I acknowledge that I have been given the opportunity to request an in-person assessment or other available alternative prior to the telemedicine visit and am voluntarily participating in the telemedicine visit.  I understand that I have the right to withhold or withdraw my consent to the use of telemedicine in the course of my care at any time, without affecting my right to future care or treatment, and that the Practitioner or I may terminate the telemedicine visit at any time. I understand that I have the right to inspect all information obtained and/or recorded in the course of the telemedicine visit and may receive copies of available information for a reasonable fee.  I understand that some of the potential risks of receiving the Services via telemedicine include:  Delay or interruption in medical evaluation due to technological equipment failure or disruption; Information transmitted  may not be sufficient (e.g. poor resolution of images) to allow for appropriate medical decision making by the Practitioner; and/or  In rare instances, security protocols could fail, causing a breach of personal health information.  Furthermore, I acknowledge that it is my responsibility to provide information about my medical history, conditions and care that is complete and accurate to the best of my ability. I acknowledge that Practitioner's advice, recommendations, and/or decision may be based on factors not within their control, such as incomplete or inaccurate data provided by me or distortions of diagnostic images or specimens that may result from electronic transmissions. I understand that the practice of medicine is not an exact science and that Practitioner makes no warranties or guarantees regarding treatment outcomes. I acknowledge that a copy of this consent can be made available to me via my patient portal ([facility]), or I can request a printed copy by calling the office of [facility].    I understand that my insurance will be billed for this visit.   I have read or had this consent read to me. I understand the contents of this consent, which adequately explains the benefits and risks of the Services being provided via telemedicine.  I have been provided ample opportunity to ask questions regarding this consent and the Services and have had my questions answered to my satisfaction. I give my informed consent for the services to be provided through the use of telemedicine in my medical care

## 2022-07-20 NOTE — Telephone Encounter (Signed)
   Name: Craig Hurley  DOB: 12/03/1945  MRN: [id number]  Primary Cardiologist: Donato Heinz, MD  Chart reviewed as part of pre-operative protocol coverage. Because of [name]'s past medical history and time since last visit, he will require a follow-up telephone visit in order to better assess preoperative cardiovascular risk.  Pre-op covering staff: - Please schedule appointment and call patient to inform them. If patient already had an upcoming appointment within acceptable timeframe, please add "pre-op clearance" to the appointment notes so provider is aware. - Please contact requesting surgeon's office via preferred method (i.e, phone, fax) to inform them of need for appointment prior to surgery.  Aspirin is prescribed by another provider. Please reach out to them for holding parameters.   Okay to hold ASA  Elgie Collard, PA-C  07/20/2022, 8:26 AM

## 2022-07-21 ENCOUNTER — Ambulatory Visit (INDEPENDENT_AMBULATORY_CARE_PROVIDER_SITE_OTHER): Payer: Medicare HMO | Admitting: Physician Assistant

## 2022-07-21 ENCOUNTER — Ambulatory Visit: Payer: Medicare HMO

## 2022-07-21 DIAGNOSIS — Z01810 Encounter for preprocedural cardiovascular examination: Secondary | ICD-10-CM | POA: Diagnosis not present

## 2022-07-21 NOTE — Progress Notes (Signed)
Virtual Visit via Telephone Note   Because of Jevonte Croll's co-morbid illnesses, he is at least at moderate risk for complications without adequate follow up.  This format is felt to be most appropriate for this patient at this time.  The patient did not have access to video technology/had technical difficulties with video requiring transitioning to audio format only (telephone).  All issues noted in this document were discussed and addressed.  No physical exam could be performed with this format.  Please refer to the patient's chart for his consent to telehealth for [facility].  Evaluation Performed:  Preoperative cardiovascular risk assessment _____________   Date:  07/21/2022   Patient ID:  Craig Hurley, DOB [birth year], MRN [id number] Patient Location:  Home Provider location:   Office  Primary Care Provider:  Dettinger, Fransisca Kaufmann, MD Primary Cardiologist:  Donato Heinz, MD  Chief Complaint / Patient Profile   76 y.o. y/o male with a h/o asthma, stroke, hyperlipidemia, previous chest pain who is pending carpal tunnel revision, left hand and presents today for telephonic preoperative cardiovascular risk assessment.  Past Medical History    Past Medical History:  Diagnosis Date   Allergy    Arthritis    Asthma    Cataract    Headache    Stroke [facility])    Past Surgical History:  Procedure Laterality Date   BACK SURGERY  2005   lumbar   EYE SURGERY     cataracts   FRACTURE SURGERY Right    SHOULDER ARTHROSCOPY Right    SKIN GRAFT FULL THICKNESS TRUNK     VASECTOMY      Allergies  Allergies  Allergen Reactions   Penicillins Swelling    History of Present Illness    Craig Hurley is a 76 y.o. male who presents via audio/video conferencing for a telehealth visit today.  Pt was last seen in cardiology clinic on 01/25/22 by Almyra Deforest, PA.  At that time Craig Hurley was doing well.  The patient is now pending procedure as outlined above.  Since his last visit, he has been doing good.  He has not had any chest pain or shortness of breath.  He has no issues with walking and can go up 8 stairs at his home without issues.  He does some yard work.  He also is able to do all the indoor housework.  For this reason he scored a 5.62 METS on the DASI.  This exceeds the minimum requirement of 4 METS.  He is already holding his aspirin since his surgery is on Monday.  Home Medications    Prior to Admission medications   Medication Sig Start Date End Date Taking? Authorizing Provider  acetaminophen (TYLENOL) 325 MG tablet Take 650 mg by mouth every 6 (six) hours as needed.    [provider]  albuterol (VENTOLIN HFA) 108 (90 Base) MCG/ACT inhaler Inhale 2 puffs into the lungs every 4 (four) hours as needed for wheezing or shortness of breath. 03/10/22   Collene Gobble, MD  aspirin EC 325 MG tablet Take 1 tablet (325 mg total) by mouth daily. 12/09/20   Marcial Pacas, MD  atorvastatin (LIPITOR) 10 MG tablet Take 1 tablet by mouth once daily 06/26/22   Dettinger, Fransisca Kaufmann, MD  budesonide-formoterol [facility]) 160-4.5 MCG/ACT inhaler Inhale 2 puffs into the lungs 2 (two) times daily. 12/27/21   Dettinger, Fransisca Kaufmann, MD  carvedilol (COREG) 3.125 MG tablet Take 1 tablet by mouth twice daily 03/27/22  Donato Heinz, MD  cetirizine (ZYRTEC) 10 MG tablet Take 1 tablet (10 mg total) by mouth daily. 05/08/16   Dettinger, Fransisca Kaufmann, MD  Cyanocobalamin (B-12 PO) Take 1,000 mcg by mouth daily.    [provider]  diclofenac Sodium (VOLTAREN) 1 % GEL Apply 2 g topically 4 (four) times daily. 09/28/21   Dettinger, Fransisca Kaufmann, MD  doxazosin (CARDURA) 4 MG tablet Take 1 tablet (4 mg total) by mouth daily. 05/10/22   Dettinger, Fransisca Kaufmann, MD  empagliflozin (JARDIANCE) 10 MG TABS tablet Take 1 tablet (10 mg total) by mouth daily before breakfast. 06/15/21   Donato Heinz, MD  latanoprost (XALATAN) 0.005 % ophthalmic solution Place 1 drop  into the left eye at bedtime. 05/03/22   [provider]  losartan (COZAAR) 25 MG tablet Take 1 tablet by mouth once daily 06/05/22   Donato Heinz, MD  meloxicam (MOBIC) 15 MG tablet Take 1 tablet (15 mg total) by mouth daily. 05/10/22   Dettinger, Fransisca Kaufmann, MD  montelukast (SINGULAIR) 10 MG tablet TAKE 1 TABLET AT BEDTIME 03/24/22   Dettinger, Fransisca Kaufmann, MD  Multiple Vitamins-Minerals (PRESERVISION AREDS 2 PO) Take 1 capsule by mouth 2 (two) times daily.    [provider]  omeprazole (PRILOSEC) 20 MG capsule TAKE 1 CAPSULE TWICE DAILY BEFORE MEALS 06/05/22   Dettinger, Fransisca Kaufmann, MD  spironolactone (ALDACTONE) 25 MG tablet Take 0.5 tablets (12.5 mg total) by mouth daily. 10/25/21 10/20/22  Donato Heinz, MD  Tiotropium Bromide Monohydrate (SPIRIVA RESPIMAT) 2.5 MCG/ACT AERS Inhale 2 puffs into the lungs daily. 12/15/21   Dettinger, Fransisca Kaufmann, MD  vitamin A 8000 UNIT capsule Take 8,000 Units by mouth daily.    [provider]    Physical Exam    Vital Signs:  Craig Hurley does not have vital signs available for review today.  Given telephonic nature of communication, physical exam is limited. AAOx3. NAD. Normal affect.  Speech and respirations are unlabored.  Accessory Clinical Findings    None  Assessment & Plan    1.  Preoperative Cardiovascular Risk Assessment:  Craig Hurley perioperative risk of a major cardiac event is 0.9% according to the Revised Cardiac Risk Index (RCRI).  Therefore, he is at low risk for perioperative complications.   His functional capacity is good at 5.62 METs according to the [facility] Activity Status Index (DASI). Recommendations: According to ACC/AHA guidelines, no further cardiovascular testing needed.  The patient may proceed to surgery at acceptable risk.   Antiplatelet and/or Anticoagulation Recommendations: Patient is already holding ASA.   A copy of this note will be routed to requesting surgeon.  Time:    Today, I have spent 5 minutes with the patient with telehealth technology discussing medical history, symptoms, and management plan.     Craig Collard, PA-C  07/21/2022, 9:42 AM

## 2022-07-24 DIAGNOSIS — G5602 Carpal tunnel syndrome, left upper limb: Secondary | ICD-10-CM | POA: Insufficient documentation

## 2022-07-24 DIAGNOSIS — G8918 Other acute postprocedural pain: Secondary | ICD-10-CM | POA: Diagnosis not present

## 2022-07-28 ENCOUNTER — Ambulatory Visit (HOSPITAL_COMMUNITY)
Admission: RE | Admit: 2022-07-28 | Discharge: 2022-07-28 | Disposition: A | Discharge status: Home / Self Care | Payer: Medicare HMO | Source: Ambulatory Visit | Attending: Emergency Medicine | Admitting: Emergency Medicine

## 2022-07-28 DIAGNOSIS — R911 Solitary pulmonary nodule: Secondary | ICD-10-CM | POA: Diagnosis not present

## 2022-07-28 DIAGNOSIS — R0609 Other forms of dyspnea: Secondary | ICD-10-CM | POA: Diagnosis not present

## 2022-07-28 DIAGNOSIS — Z6834 Body mass index (BMI) 34.0-34.9, adult: Secondary | ICD-10-CM | POA: Insufficient documentation

## 2022-07-28 DIAGNOSIS — R918 Other nonspecific abnormal finding of lung field: Secondary | ICD-10-CM | POA: Insufficient documentation

## 2022-07-28 DIAGNOSIS — J439 Emphysema, unspecified: Secondary | ICD-10-CM | POA: Diagnosis not present

## 2022-07-28 DIAGNOSIS — J438 Other emphysema: Secondary | ICD-10-CM | POA: Diagnosis not present

## 2022-07-28 DIAGNOSIS — E6609 Other obesity due to excess calories: Secondary | ICD-10-CM | POA: Insufficient documentation

## 2022-08-01 ENCOUNTER — Ambulatory Visit (INDEPENDENT_AMBULATORY_CARE_PROVIDER_SITE_OTHER): Payer: Medicare HMO

## 2022-08-01 VITALS — Wt 245.0 lb

## 2022-08-01 DIAGNOSIS — Z0000 Encounter for general adult medical examination without abnormal findings: Secondary | ICD-10-CM | POA: Diagnosis not present

## 2022-08-01 NOTE — Patient Instructions (Addendum)
[name] , Thank you for taking time to come for your Medicare Wellness Visit. I appreciate your ongoing commitment to your health goals. Please review the following plan we discussed and let me know if I can assist you in the future.   Screening recommendations/referrals: Colonoscopy: Done 05/25/2015 - no repeat due to age Recommended yearly ophthalmology/optometry visit for glaucoma screening and checkup Recommended yearly dental visit for hygiene and checkup  Vaccinations: Influenza vaccine: Done 09/16/2021 - Repeat annually Pneumococcal vaccine: Done 01/12/2011 & 01/18/2015  Tdap vaccine: Done 07/11/2018 - Repeat in 10 years  Shingles vaccine: Done 05/05/2021 & 12/29/2021 Covid-19: Done 02/06/2020, 03/09/2020, 11/10/2020, 06/09/2021  Advanced directives: in chart  Conditions/risks identified: Each day, aim for 6 glasses of water, plenty of protein in your diet and try to get up and walk/ stretch every hour for 5-10 minutes at a time.    Next appointment: Follow up in one year for your annual wellness visit.   Preventive Care 8 Years and Older, Male  Preventive care refers to lifestyle choices and visits with your health care provider that can promote health and wellness. What does preventive care include? A yearly physical exam. This is also called an annual well check. Dental exams once or twice a year. Routine eye exams. Ask your health care provider how often you should have your eyes checked. Personal lifestyle choices, including: Daily care of your teeth and gums. Regular physical activity. Eating a healthy diet. Avoiding tobacco and drug use. Limiting alcohol use. Practicing safe sex. Taking low doses of aspirin every day. Taking vitamin and mineral supplements as recommended by your health care provider. What happens during an annual well check? The services and screenings done by your health care provider during your annual well check will depend on your age, overall  health, lifestyle risk factors, and family history of disease. Counseling  Your health care provider may ask you questions about your: Alcohol use. Tobacco use. Drug use. Emotional well-being. Home and relationship well-being. Sexual activity. Eating habits. History of falls. Memory and ability to understand (cognition). Work and work Statistician. Screening  You may have the following tests or measurements: Height, weight, and BMI. Blood pressure. Lipid and cholesterol levels. These may be checked every 5 years, or more frequently if you are over 23 years old. Skin check. Lung cancer screening. You may have this screening every year starting at age 75 if you have a 30-pack-year history of smoking and currently smoke or have quit within the past 15 years. Fecal occult blood test (FOBT) of the stool. You may have this test every year starting at age 35. Flexible sigmoidoscopy or colonoscopy. You may have a sigmoidoscopy every 5 years or a colonoscopy every 10 years starting at age 30. Prostate cancer screening. Recommendations will vary depending on your family history and other risks. Hepatitis C blood test. Hepatitis B blood test. Sexually transmitted disease (STD) testing. Diabetes screening. This is done by checking your blood sugar (glucose) after you have not eaten for a while (fasting). You may have this done every 1-3 years. Abdominal aortic aneurysm (AAA) screening. You may need this if you are a current or former smoker. Osteoporosis. You may be screened starting at age 50 if you are at high risk. Talk with your health care provider about your test results, treatment options, and if necessary, the need for more tests. Vaccines  Your health care provider may recommend certain vaccines, such as: Influenza vaccine. This is recommended every year. Tetanus,  diphtheria, and acellular pertussis (Tdap, Td) vaccine. You may need a Td booster every 10 years. Zoster vaccine. You may  need this after age 60. Pneumococcal 13-valent conjugate (PCV13) vaccine. One dose is recommended after age 45. Pneumococcal polysaccharide (PPSV23) vaccine. One dose is recommended after age 82. Talk to your health care provider about which screenings and vaccines you need and how often you need them. This information is not intended to replace advice given to you by your health care provider. Make sure you discuss any questions you have with your health care provider. Document Released: 12/10/2015 Document Revised: 08/02/2016 Document Reviewed: 09/14/2015 Elsevier Interactive Patient Education  2017 [employer] Prevention in the Home Falls can cause injuries. They can happen to people of all ages. There are many things you can do to make your home safe and to help prevent falls. What can I do on the outside of my home? Regularly fix the edges of walkways and driveways and fix any cracks. Remove anything that might make you trip as you walk through a door, such as a raised step or threshold. Trim any bushes or trees on the path to your home. Use bright outdoor lighting. Clear any walking paths of anything that might make someone trip, such as rocks or tools. Regularly check to see if handrails are loose or broken. Make sure that both sides of any steps have handrails. Any raised decks and porches should have guardrails on the edges. Have any leaves, snow, or ice cleared regularly. Use sand or salt on walking paths during winter. Clean up any spills in your garage right away. This includes oil or grease spills. What can I do in the bathroom? Use night lights. Install grab bars by the toilet and in the tub and shower. Do not use towel bars as grab bars. Use non-skid mats or decals in the tub or shower. If you need to sit down in the shower, use a plastic, non-slip stool. Keep the floor dry. Clean up any water that spills on the floor as soon as it happens. Remove soap buildup in  the tub or shower regularly. Attach bath mats securely with double-sided non-slip rug tape. Do not have throw rugs and other things on the floor that can make you trip. What can I do in the bedroom? Use night lights. Make sure that you have a light by your bed that is easy to reach. Do not use any sheets or blankets that are too big for your bed. They should not hang down onto the floor. Have a firm chair that has side arms. You can use this for support while you get dressed. Do not have throw rugs and other things on the floor that can make you trip. What can I do in the kitchen? Clean up any spills right away. Avoid walking on wet floors. Keep items that you use a lot in easy-to-reach places. If you need to reach something above you, use a strong step stool that has a grab bar. Keep electrical cords out of the way. Do not use floor polish or wax that makes floors slippery. If you must use wax, use non-skid floor wax. Do not have throw rugs and other things on the floor that can make you trip. What can I do with my stairs? Do not leave any items on the stairs. Make sure that there are handrails on both sides of the stairs and use them. Fix handrails that are broken or loose. Make  sure that handrails are as long as the stairways. Check any carpeting to make sure that it is firmly attached to the stairs. Fix any carpet that is loose or worn. Avoid having throw rugs at the top or bottom of the stairs. If you do have throw rugs, attach them to the floor with carpet tape. Make sure that you have a light switch at the top of the stairs and the bottom of the stairs. If you do not have them, ask someone to add them for you. What else can I do to help prevent falls? Wear shoes that: Do not have high heels. Have rubber bottoms. Are comfortable and fit you well. Are closed at the toe. Do not wear sandals. If you use a stepladder: Make sure that it is fully opened. Do not climb a closed  stepladder. Make sure that both sides of the stepladder are locked into place. Ask someone to hold it for you, if possible. Clearly mark and make sure that you can see: Any grab bars or handrails. First and last steps. Where the edge of each step is. Use tools that help you move around (mobility aids) if they are needed. These include: Canes. Walkers. Scooters. Crutches. Turn on the lights when you go into a dark area. Replace any light bulbs as soon as they burn out. Set up your furniture so you have a clear path. Avoid moving your furniture around. If any of your floors are uneven, fix them. If there are any pets around you, be aware of where they are. Review your medicines with your doctor. Some medicines can make you feel dizzy. This can increase your chance of falling. Ask your doctor what other things that you can do to help prevent falls. This information is not intended to replace advice given to you by your health care provider. Make sure you discuss any questions you have with your health care provider. Document Released: 09/09/2009 Document Revised: 04/20/2016 Document Reviewed: 12/18/2014 Elsevier Interactive Patient Education  2017 [employer].

## 2022-08-01 NOTE — Progress Notes (Signed)
Subjective:   Craig Hurley is a 76 y.o. male who presents for Medicare Annual/Subsequent preventive examination.  Virtual Visit via Telephone Note  I connected with  Craig Hurley on 08/01/22 at 11:15 AM EDT by telephone and verified that I am speaking with the correct person using two identifiers.  Location: Patient: Home Provider: [facility] Persons participating in the virtual visit: patient/Nurse Health Advisor   I discussed the limitations, risks, security and privacy concerns of performing an evaluation and management service by telephone and the availability of in person appointments. The patient expressed understanding and agreed to proceed.  Interactive audio and video telecommunications were attempted between this nurse and patient, however failed, due to patient having technical difficulties OR patient did not have access to video capability.  We continued and completed visit with audio only.  Some vital signs may be absent or patient reported.   Meryn Sarracino E Lorel Lembo, LPN   Review of Systems     Cardiac Risk Factors include: advanced age (>51mn, >>94women);obesity (BMI >30kg/m2);sedentary lifestyle;male gender;Other (see comment), Risk factor comments: chronic respiratory failure, carotid stenosis, hx CVA, CHF, COPD     Objective:    Today's Vitals   08/01/22 1116  Weight: 245 lb (111.1 kg)   Body mass index is 38.37 kg/m.     08/01/2022   12:42 PM 07/20/2021    9:31 AM 07/19/2020    8:44 AM 07/14/2019    8:39 AM 11/05/2018   12:32 PM 07/11/2018    3:17 PM 07/05/2017    4:55 PM  Advanced Directives  Does Patient Have a Medical Advance Directive? Yes Yes Yes Yes No Yes No  Type of AParamedicof [place] will [place] will Living will Living will  [place] will   Does patient want to make changes to medical advance directive? No - Patient declined  No - Patient declined No - Patient  declined     Copy of HR[place]in Chart? Yes - validated most recent copy scanned in chart (See row information) No - copy requested    No - copy requested   Would patient like information on creating a medical advance directive?      Yes (MAU/Ambulatory/Procedural Areas - Information given) Yes (MAU/Ambulatory/Procedural Areas - Information given)    Current Medications (verified) Outpatient Encounter Medications as of 08/01/2022  Medication Sig   acetaminophen (TYLENOL) 325 MG tablet Take 650 mg by mouth every 6 (six) hours as needed.   albuterol (VENTOLIN HFA) 108 (90 Base) MCG/ACT inhaler Inhale 2 puffs into the lungs every 4 (four) hours as needed for wheezing or shortness of breath.   aspirin EC 325 MG tablet Take 1 tablet (325 mg total) by mouth daily.   atorvastatin (LIPITOR) 10 MG tablet Take 1 tablet by mouth once daily   budesonide-formoterol (SYMBICORT) 160-4.5 MCG/ACT inhaler Inhale 2 puffs into the lungs 2 (two) times daily.   carvedilol (COREG) 3.125 MG tablet Take 1 tablet by mouth twice daily   cetirizine (ZYRTEC) 10 MG tablet Take 1 tablet (10 mg total) by mouth daily.   Cyanocobalamin (B-12 PO) Take 1,000 mcg by mouth daily.   diclofenac Sodium (VOLTAREN) 1 % GEL Apply 2 g topically 4 (four) times daily.   doxazosin (CARDURA) 4 MG tablet Take 1 tablet (4 mg total) by mouth daily.   empagliflozin (JARDIANCE) 10 MG TABS tablet Take 1 tablet (10 mg total) by mouth daily before breakfast.  latanoprost (XALATAN) 0.005 % ophthalmic solution Place 1 drop into the left eye at bedtime.   losartan (COZAAR) 25 MG tablet Take 1 tablet by mouth once daily   meloxicam (MOBIC) 15 MG tablet Take 1 tablet (15 mg total) by mouth daily.   montelukast (SINGULAIR) 10 MG tablet TAKE 1 TABLET AT BEDTIME   Multiple Vitamins-Minerals (PRESERVISION AREDS 2 PO) Take 1 capsule by mouth 2 (two) times daily.   omeprazole (PRILOSEC) 20 MG capsule TAKE 1 CAPSULE TWICE DAILY BEFORE MEALS    spironolactone (ALDACTONE) 25 MG tablet Take 0.5 tablets (12.5 mg total) by mouth daily.   Tiotropium Bromide Monohydrate (SPIRIVA RESPIMAT) 2.5 MCG/ACT AERS Inhale 2 puffs into the lungs daily.   vitamin A 8000 UNIT capsule Take 8,000 Units by mouth daily.   [DISCONTINUED] HYDROcodone-acetaminophen (NORCO/VICODIN) 5-325 MG tablet Take 1 tablet by mouth every 6 (six) hours as needed. (Patient not taking: Reported on 08/01/2022)   No facility-administered encounter medications on file as of 08/01/2022.    Allergies (verified) Penicillins   History: Past Medical History:  Diagnosis Date   Allergy    Arthritis    Asthma    Cataract    Headache    Stroke [facility])    Past Surgical History:  Procedure Laterality Date   BACK SURGERY  2005   lumbar   EYE SURGERY     cataracts   FRACTURE SURGERY Right    SHOULDER ARTHROSCOPY Right    SKIN GRAFT FULL THICKNESS TRUNK     VASECTOMY     Family History  Adopted: Yes  Problem Relation Age of Onset   Colon cancer Mother    Cirrhosis Father    Social History   Socioeconomic History   Marital status: Married    Spouse name: Blanch Media   Number of children: 2   Years of education: 12   Highest education level: GED or equivalent  Occupational History   Occupation: Retired  Tobacco Use   Smoking status: Former    Types: Cigars   Smokeless tobacco: Current    Types: Snuff   Tobacco comments:    Pt states he has been quitting with smoking but is unsure when he quit.   Vaping Use   Vaping Use: Never used  Substance and Sexual Activity   Alcohol use: No   Drug use: No   Sexual activity: Yes  Other Topics Concern   Not on file  Social History Narrative   Lives at home with his wife.   Right-handed.   12 cups coffee day.   Children in [place]   Social Determinants of Health   Financial Resource Strain: Low Risk  (08/01/2022)   Overall Financial Resource Strain (CARDIA)    Difficulty of Paying Living Expenses: Not hard at all  Food  Insecurity: No Food Insecurity (08/01/2022)   Hunger Vital Sign    Worried About Running Out of Food in the Last Year: Never true    Ran Out of Food in the Last Year: Never true  Transportation Needs: No Transportation Needs (08/01/2022)   PRAPARE - Hydrologist (Medical): No    Lack of Transportation (Non-Medical): No  Physical Activity: Insufficiently Active (08/01/2022)   Exercise Vital Sign    Days of Exercise per Week: 7 days    Minutes of Exercise per Session: 10 min  Stress: No Stress Concern Present (08/01/2022)   Filer    Feeling of  Stress : Not at all  Social Connections: Moderately Isolated (08/01/2022)   Social Connection and Isolation Panel [NHANES]    Frequency of Communication with Friends and Family: More than three times a week    Frequency of Social Gatherings with Friends and Family: More than three times a week    Attends Religious Services: Never    Marine scientist or Organizations: No    Attends Music therapist: Never    Marital Status: Married    Tobacco Counseling Ready to quit: Not Answered Counseling given: Not Answered Tobacco comments: Pt states he has been quitting with smoking but is unsure when he quit.    Clinical Intake:  Pre-visit preparation completed: Yes  Pain : No/denies pain     BMI - recorded: 38.37 Nutritional Status: BMI > 30  Obese Nutritional Risks: None Diabetes: No  How often do you need to have someone help you when you read instructions, pamphlets, or other written materials from your doctor or pharmacy?: 2 - Rarely  Diabetic? no  Interpreter Needed?: No  Information entered by :: Mynor Witkop, LPN   Activities of Daily Living    08/01/2022   11:19 AM  In your present state of health, do you have any difficulty performing the following activities:  Hearing? 1  Vision? 0  Difficulty concentrating or making  decisions? 0  Walking or climbing stairs? 0  Dressing or bathing? 0  Doing errands, shopping? 1  Comment can drive a little  Preparing Food and eating ? N  Using the Toilet? N  In the past six months, have you accidently leaked urine? N  Do you have problems with loss of bowel control? N  Managing your Medications? Y  Comment wife helps  Managing your Finances? N  Housekeeping or managing your Housekeeping? N    Patient Care Team: Dettinger, Fransisca Kaufmann, MD as PCP - General (Family Medicine) Donato Heinz, MD as PCP - Cardiology (Cardiology) Lavera Guise, [facility] (Pharmacist) Leticia Clas, [place] (Optometry) Donato Heinz, MD as Consulting Physician (Cardiology) Marcial Pacas, MD as Consulting Physician (Neurology)  Indicate any recent Medical Services you may have received from other than [facility] providers in the past year (date may be approximate).     Assessment:   This is a routine wellness examination for Craig Hurley.  Hearing/Vision screen Hearing Screening - Comments:: Wears hearing aids - from [facility] - Comments:: Wears rx glasses - up to date with routine eye exams with [facility] in [place] issues and exercise activities discussed: Current Exercise Habits: Home exercise routine, Type of exercise: walking, Time (Minutes): 10, Frequency (Times/Week): 7, Weekly Exercise (Minutes/Week): 70, Intensity: Mild, Exercise limited by: respiratory conditions(s)   Goals Addressed             This Visit's Progress    Patient Stated       Maintain current indepedence        Depression Screen    08/01/2022   12:34 PM 05/26/2022    4:29 PM 05/10/2022    9:01 AM 11/07/2021    9:27 AM 09/28/2021   11:00 AM 07/20/2021    9:19 AM 05/05/2021   11:13 AM  PHQ 2/9 Scores  PHQ - 2 Score 0 0 0 0 0 0 0  PHQ- 9 Score  0 0        Fall Risk    08/01/2022   11:20 AM 05/26/2022  4:29 PM 05/10/2022    9:00 AM 11/07/2021    9:26 AM  09/28/2021   11:00 AM  Fall Risk   Falls in the past year? 1 0 0 1 0  Number falls in past yr: 0   1   Injury with Fall? 0   0   Risk for fall due to : History of fall(s);Mental status change;Impaired balance/gait   History of fall(s)   Follow up Falls prevention discussed;Education provided   Falls prevention discussed     FALL RISK PREVENTION PERTAINING TO THE HOME:  Any stairs in or around the home? Yes  If so, are there any without handrails? Yes  - he says he doesn't need them and doesn't have trouble with them Home free of loose throw rugs in walkways, pet beds, electrical cords, etc? Yes  Adequate lighting in your home to reduce risk of falls? Yes   ASSISTIVE DEVICES UTILIZED TO PREVENT FALLS:  Life alert? No  Use of a cane, walker or w/c? Yes  Grab bars in the bathroom? No  Shower chair or bench in shower? Yes  Elevated toilet seat or a handicapped toilet? Yes   TIMED UP AND GO:  Was the test performed? No . Telephonic visit  Cognitive Function:    07/11/2018    3:23 PM 07/05/2017    2:54 PM  MMSE - Mini Mental State Exam  Orientation to time 4 4  Orientation to Place 5 5  Registration 3 3  Attention/ Calculation 4 5  Recall 3 2  Language- name 2 objects 2 2  Language- repeat 1 1  Language- follow 3 step command 3 3  Language- read & follow direction 1 1  Write a sentence 1 1  Copy design 1 1  Total score 28 28        08/01/2022   11:24 AM 07/20/2021    9:25 AM 07/19/2020    8:46 AM 07/14/2019    8:42 AM  6CIT Screen  What Year? 4 points 0 points 0 points 0 points  What month? 3 points 0 points 0 points 0 points  What time? 0 points 0 points 0 points 0 points  Count back from 20 0 points 0 points 0 points 0 points  Months in reverse 4 points 4 points 0 points 2 points  Repeat phrase 4 points 6 points 0 points 2 points  Total Score 15 points 10 points 0 points 4 points    Immunizations Immunization History  Administered Date(s) Administered   Fluad  Quad(high Dose 65+) 08/06/2019, 09/22/2020, 09/16/2021   H1N1 01/14/2009   Influenza Split 12/25/2005, 10/12/2009, 11/07/2010, 07/18/2011, 11/14/2012, 08/30/2013   Influenza, High Dose Seasonal PF 09/28/2017, 09/11/2018   Influenza,inj,Quad PF,6+ Mos 09/12/2014, 08/18/2015, 10/25/2016   Influenza-Unspecified 09/11/2018   Moderna SARS-COV2 Booster Vaccination 11/10/2020   Moderna Sars-Covid-2 Vaccination 02/06/2020, 03/09/2020, 06/09/2021   Pneumococcal Conjugate-13 01/18/2015   Pneumococcal Polysaccharide-23 01/12/2011   Td 11/22/1998, 07/11/2018   Tdap 01/08/2008   Zoster Recombinat (Shingrix) 05/05/2021, 12/29/2021   Zoster, Live 01/17/2012    TDAP status: Up to date  Flu Vaccine status: Up to date  Pneumococcal vaccine status: Up to date  Covid-19 vaccine status: Completed vaccines  Qualifies for Shingles Vaccine? Yes   Zostavax completed Yes   Shingrix Completed?: Yes  Screening Tests Health Maintenance  Topic Date Due   INFLUENZA VACCINE  06/27/2022   COVID-19 Vaccine (4 - Moderna risk series) 05/07/2023 (Originally 08/04/2021)   TETANUS/TDAP  07/11/2028  Pneumonia Vaccine 36+ Years old  Completed   Hepatitis C Screening  Completed   Zoster Vaccines- Shingrix  Completed   HPV VACCINES  Aged Out   COLONOSCOPY (Pts 45-26yr Insurance coverage will need to be confirmed)  Discontinued    Health Maintenance  Health Maintenance Due  Topic Date Due   INFLUENZA VACCINE  06/27/2022    Colorectal cancer screening: No longer required.   Lung Cancer Screening: (Low Dose CT Chest recommended if Age 77-80years, 30 pack-year currently smoking OR have quit w/in 15years.) does not qualify  Additional Screening:  Hepatitis C Screening: does qualify; Completed 11/08/2015  Vision Screening: Recommended annual ophthalmology exams for early detection of glaucoma and other disorders of the eye. Is the patient up to date with their annual eye exam?  Yes  Who is the provider or  what is the name of the office in which the patient attends annual eye exams? [facility] [place] is not established with a provider, would they like to be referred to a provider to establish care? No .   Dental Screening: Recommended annual dental exams for proper oral hygiene  Community Resource Referral / Chronic Care Management: CRR required this visit?  No   CCM required this visit?  No      Plan:     I have personally reviewed and noted the following in the patient's chart:   Medical and social history Use of alcohol, tobacco or illicit drugs  Current medications and supplements including opioid prescriptions. Patient is not currently taking opioid prescriptions. Functional ability and status Nutritional status Physical activity Advanced directives List of other physicians Hospitalizations, surgeries, and ER visits in previous 12 months Vitals Screenings to include cognitive, depression, and falls Referrals and appointments  In addition, I have reviewed and discussed with patient certain preventive protocols, quality metrics, and best practice recommendations. A written personalized care plan for preventive services as well as general preventive health recommendations were provided to patient.     ASandrea Hammond LPN   [id number]  Nurse Notes: none

## 2022-08-01 NOTE — Progress Notes (Unsigned)
Cardiology Office Note:    Date:  08/02/2022   ID:  Craig Hurley, DOB Nov 01, 1946, MRN [id number]  PCP:  Dettinger, Fransisca Kaufmann, MD  Cardiologist:  Donato Heinz, MD  Electrophysiologist:  None   Referring MD: Dettinger, Fransisca Kaufmann, MD   Chief Complaint  Patient presents with   Congestive Heart Failure    History of Present Illness:    Craig Hurley is a 76 y.o. male with a hx of CVA, COPD, hyperlipidemia who presents for follow-up.  He was referred by Dr. Krista Blue for evaluation of CVA, initially seen on 03/01/2021.  He had sudden onset right-sided weakness and language difficulty on 12/25/2019, diagnosed with a left MCA stroke, treated with TPA.  MRI brain showed acute left MCA infarct.  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  Echocardiogram showed EF 40 to 45%.  His hospital course was complicated by acute hypoxic respiratory failure from COVID-19 infection, treated with Decadron and remdesivir.  Echocardiogram on 04/12/2021 showed LVEF 45 to 50%, global hypokinesis, mild LV dilatation, grade 1 diastolic dysfunction, normal RV function, moderate left atrial dilatation, mild MR.  Lexiscan Myoview on 04/12/2021 showed small fixed perfusion defect in apical inferior wall/apex consistent with prior infarct, LVEF 29%.  Cardiac MRI on 05/26/2021 showed LVEF 41%, RVEF 53%, no LGE.  Zio patch x14 days on 07/07/2021 showed 2 episodes of NSVT longest lasting 6 beats and 11 episodes of SVT longest lasting 14 seconds.  Coronary CTA on 11/03/2021 showed minimal nonobstructive CAD, calcium score 59 (62nd percentile).  Also noted to have clustered nodule densities in right middle lobe and 9 mm left lower lobe pulmonary nodule.  Since last clinic visit, he reports that he is doing okay.  Continues to have dyspnea  with minimal exertion.  Denies any recent chest pain.  Denies any lightheadedness, syncope, lower extremity edema, or palpitations.  Weight is down 4 pounds since last clinic visit.  Reports has not been exercising.   Wt Readings from Last 3 Encounters:  08/02/22 251 lb (113.9 kg)  08/01/22 245 lb (111.1 kg)  05/26/22 252 lb (114.3 kg)   BP Readings from Last 3 Encounters:  08/02/22 110/70  05/26/22 113/73  05/10/22 113/66     Past Medical History:  Diagnosis Date   Allergy    Arthritis    Asthma    Cataract    Headache    Stroke [facility])     Past Surgical History:  Procedure Laterality Date   BACK SURGERY  2005   lumbar   EYE SURGERY     cataracts   FRACTURE SURGERY Right    SHOULDER ARTHROSCOPY Right    SKIN GRAFT FULL THICKNESS TRUNK     VASECTOMY      Current Medications: Current Meds  Medication Sig   acetaminophen (TYLENOL) 325 MG tablet Take 650 mg by mouth every 6 (six) hours as needed.   albuterol (VENTOLIN HFA) 108 (90 Base) MCG/ACT inhaler Inhale 2 puffs into the lungs every 4 (four) hours as needed for wheezing or shortness of breath.   aspirin EC 325 MG tablet Take 1 tablet (325 mg total) by mouth daily.   atorvastatin (LIPITOR) 10 MG tablet Take 1 tablet by mouth once daily   budesonide-formoterol (SYMBICORT) 160-4.5 MCG/ACT inhaler Inhale 2 puffs into the lungs 2 (two) times daily.  carvedilol (COREG) 3.125 MG tablet Take 1 tablet by mouth twice daily   cetirizine (ZYRTEC) 10 MG tablet Take 1 tablet (10 mg total) by mouth daily.   Cyanocobalamin (B-12 PO) Take 1,000 mcg by mouth daily.   diclofenac Sodium (VOLTAREN) 1 % GEL Apply 2 g topically 4 (four) times daily.   doxazosin (CARDURA) 4 MG tablet Take 1 tablet (4 mg total) by mouth daily.   empagliflozin (JARDIANCE) 10 MG TABS tablet Take 1 tablet (10 mg total) by mouth daily before breakfast.   latanoprost (XALATAN) 0.005 % ophthalmic solution Place 1 drop into the left eye at bedtime.   meloxicam  (MOBIC) 15 MG tablet Take 1 tablet (15 mg total) by mouth daily.   montelukast (SINGULAIR) 10 MG tablet TAKE 1 TABLET AT BEDTIME   Multiple Vitamins-Minerals (PRESERVISION AREDS 2 PO) Take 1 capsule by mouth 2 (two) times daily.   omeprazole (PRILOSEC) 20 MG capsule TAKE 1 CAPSULE TWICE DAILY BEFORE MEALS   sacubitril-valsartan (ENTRESTO) 24-26 MG Take 1 tablet by mouth 2 (two) times daily.   spironolactone (ALDACTONE) 25 MG tablet Take 0.5 tablets (12.5 mg total) by mouth daily.   Tiotropium Bromide Monohydrate (SPIRIVA RESPIMAT) 2.5 MCG/ACT AERS Inhale 2 puffs into the lungs daily.   vitamin A 8000 UNIT capsule Take 8,000 Units by mouth daily.   [DISCONTINUED] losartan (COZAAR) 25 MG tablet Take 1 tablet by mouth once daily     Allergies:   Penicillins   Social History   Socioeconomic History   Marital status: Married    Spouse name: Blanch Media   Number of children: 2   Years of education: 12   Highest education level: GED or equivalent  Occupational History   Occupation: Retired  Tobacco Use   Smoking status: Former    Types: Cigars   Smokeless tobacco: Current    Types: Snuff   Tobacco comments:    Pt states he has been quitting with smoking but is unsure when he quit.   Vaping Use   Vaping Use: Never used  Substance and Sexual Activity   Alcohol use: No   Drug use: No   Sexual activity: Yes  Other Topics Concern   Not on file  Social History Narrative   Lives at home with his wife.   Right-handed.   12 cups coffee day.   [place] in [place]   Social Determinants of Health   Financial Resource Strain: Low Risk  (08/01/2022)   Overall Financial Resource Strain (CARDIA)    Difficulty of Paying Living Expenses: Not hard at all  Food Insecurity: No Food Insecurity (08/01/2022)   Hunger Vital Sign    Worried About Running Out of Food in the Last Year: Never true    Ran Out of Food in the Last Year: Never true  Transportation Needs: No Transportation Needs (08/01/2022)   PRAPARE -  Hydrologist (Medical): No    Lack of Transportation (Non-Medical): No  Physical Activity: Insufficiently Active (08/01/2022)   Exercise Vital Sign    Days of Exercise per Week: 7 days    Minutes of Exercise per Session: 10 min  Stress: No Stress Concern Present (08/01/2022)   [place]    Feeling of Stress : Not at all  Social Connections: Moderately Isolated (08/01/2022)   Social Connection and Isolation Panel [NHANES]    Frequency of Communication with Friends and Family: More than three times a week  Frequency of Social Gatherings with Friends and Family: More than three times a week    Attends Religious Services: Never    Marine scientist or Organizations: No    Attends Archivist Meetings: Never    Marital Status: Married     Family History: The patient's family history includes Cirrhosis in his father; Colon cancer in his mother. He was adopted.  ROS:   Please see the history of present illness.      All other systems reviewed and are negative.  EKGs/Labs/Other Studies Reviewed:    The following studies were reviewed today: [place] 03/2021: Study Highlights   The left ventricular ejection fraction is severely decreased (<30%). Nuclear stress EF: 29%. Defect 1: There is a small defect of mild severity present in the apical inferior and apex location. Findings consistent with prior myocardial infarction. This is a high risk study.   1. Small fixed perfusion defect in apical inferior wall/apex consistent with infarct 2. Severe LV systolic dysfunction (EF 09%) 3. High risk study given severe LV dysfunction.  No ischemia  Echo 03/2021:   IMPRESSIONS    1. Left ventricular ejection fraction, by estimation, is 45 to 50%. The  left ventricle has mildly decreased function. The left ventricle  demonstrates global hypokinesis. The left ventricular internal cavity  size  was mildly dilated. Left ventricular  diastolic parameters are consistent with Grade I diastolic dysfunction  (impaired relaxation). Elevated left atrial pressure.   2. Right ventricular systolic function is normal. The right ventricular  size is normal.   3. Left atrial size was moderately dilated.   4. The mitral valve is normal in structure. Mild mitral valve  regurgitation. No evidence of mitral stenosis.   5. The aortic valve is tricuspid. Aortic valve regurgitation is trivial.  No aortic stenosis is present.   EKG:  08/02/21: Normal sinus rhythm, rate 65 left bundle branch block 10/24/21:NSR, rate 59, LBBB 04/27/2021- The EKG ordered demonstrates Normal sinus rhythm, rate 61,LBBB 03/01/2021- The ekg ordered demonstrates normal sinus rhythm, rate 70, left bundle branch block  Recent Labs: 05/10/2022: ALT 12; BUN 17; Creatinine, Ser 0.94; Hemoglobin 15.1; Platelets 180; Potassium 4.3; Sodium 140  Recent Lipid Panel    Component Value Date/Time   CHOL 125 05/10/2022 0857   TRIG 71 05/10/2022 0857   HDL 48 05/10/2022 0857   CHOLHDL 2.6 05/10/2022 0857   LDLCALC 62 05/10/2022 0857    Physical Exam:    VS:  BP 110/70   Pulse 65   Ht '5\' 9"'$  (1.753 m)   Wt 251 lb (113.9 kg)   SpO2 97%   BMI 37.07 kg/m     Wt Readings from Last 3 Encounters:  08/02/22 251 lb (113.9 kg)  08/01/22 245 lb (111.1 kg)  05/26/22 252 lb (114.3 kg)     GEN: Well nourished, well developed in no acute distress HEENT: Normal NECK: No JVD; No carotid bruits CARDIAC: RRR, no murmurs, rubs, gallops RESPIRATORY:  Clear to auscultation without rales, wheezing or rhonchi  ABDOMEN: Soft, non-tender, non-distended MUSCULOSKELETAL:  Trace edema; No deformity  SKIN: Warm and dry NEUROLOGIC:  Alert and oriented x 3 PSYCHIATRIC:  Normal affect   ASSESSMENT:    1. Chronic combined systolic and diastolic heart failure ([place])   2. Essential hypertension   3. Chest pain of uncertain etiology   4. DOE  (dyspnea on exertion)   5. Hyperlipidemia, unspecified hyperlipidemia type      PLAN:    Chest  pain/dyspnea on exertion: Chest pain is atypical description but does have CAD risk factors (hypertension, hyperlipidemia, age, former tobacco use).  Echocardiogram on 04/12/2021 showed LVEF 45 to 50%, global hypokinesis, mild LV dilatation, grade 1 diastolic dysfunction, normal RV function, moderate left atrial dilatation, mild MR.  Lexiscan Myoview on 04/12/2021 showed small fixed perfusion defect in apical inferior wall/apex consistent with prior infarct, LVEF 29%.  Given abnormal Myoview and he continued to have chest pain, coronary CTA was done.  Coronary CTA on 11/03/2021 showed minimal nonobstructive CAD, calcium score 59 (62nd percentile).   -Continue aspirin, statin  Chronic combined systolic and diastolic heart failure: EF 40 to 45% after CVA in January 2021.  Echocardiogram on 04/12/2021 showed LVEF 45 to 50%.  Lexiscan Myoview on 04/12/2021 showed fixed perfusion defect in apical inferior wall in the apex, no ischemia.  Cardiac MRI on 05/26/2021 showed LVEF 41%, RVEF 53%, no LGE. -Switch from losartan to Entresto 24-26 mg twice daily.  Check BMET in 1 week -Continue carvedilol 3.125 mg twice daily.   -Continue Jardiance 10 mg daily -Continue spironolactone 12.5 mg daily  CVA: Left MCA stroke on 12/25/2019.  Received IV TPA.  On aspirin, statin.  Follows with neurology.  Zio patch x14 days on 07/07/2021 showed 2 episodes of NSVT longest lasting 6 beats and 11 episodes of SVT longest lasting 14 seconds, no Afib.  Hyperlipidemia: On atorvastatin 10 mg daily.  LDL 62 on 05/10/22  Hypertension: On doxazosin 4 mg daily, which he takes for BPH.  Added entresto and coreg and spironolactone for heart failure as above.  Appears controlled  Lung nodule: Coronary CTA on 11/03/2021 showed clustered nodule densities in right middle lobe and 9 mm left lower lobe pulmonary nodule.  Repeat chest CT 02/20/2022 showed  stable nodules, but with peripheral reticulations suggestive of pulmonary fibrosis.  Referred to pulmonology.   RTC in 4 months    Medication Adjustments/Labs and Tests Ordered: Current medicines are reviewed at length with the patient today.  Concerns regarding medicines are outlined above.  Orders Placed This Encounter  Procedures   Basic metabolic panel   EKG [id number]     Meds ordered this encounter  Medications   sacubitril-valsartan (ENTRESTO) 24-26 MG    Sig: Take 1 tablet by mouth 2 (two) times daily.    Dispense:  60 tablet    Refill:  3      Patient Instructions  Medication Instructions:  STOP Losartan START Entresto 24/26 mg two times daily  *If you need a refill on your cardiac medications before your next appointment, please call your pharmacy*   Lab Work: Please return for labs in 1 week (BMET)  Our in office lab hours are Monday-Friday 8:00-4:00, closed for lunch 12:45-1:45 pm.  No appointment needed.  [facility] locations:   [place] ([place]) - [address] [place] [address] - [address] [place] - [address] ([facility])  Follow-Up: At [facility], you and your health needs are our priority.  As part of our continuing mission to provide you with exceptional heart care, we have created designated Provider Care Teams.  These  Care Teams include your primary Cardiologist (physician) and Advanced Practice Providers (APPs -  Physician Assistants and Nurse Practitioners) who all work together to provide you with the care you need, when you need it.  We recommend signing up for the patient portal called "MyChart".  Sign up information is provided on this  After Visit Summary.  MyChart is used to connect with patients for Virtual Visits (Telemedicine).  Patients are able to view lab/test results, encounter notes, upcoming appointments, etc.  Non-urgent messages can be sent to your provider as well.   To learn more about what you can do with MyChart, go to [email or web address].    Your next appointment:   4 month(s)  The format for your next appointment:   In Person  Provider:   Donato Heinz, MD      Important Information About Sugar           Signed, Donato Heinz, MD  08/02/2022 1:51 PM    [place]

## 2022-08-02 ENCOUNTER — Encounter: Payer: Self-pay | Admitting: Cardiology

## 2022-08-02 ENCOUNTER — Ambulatory Visit: Payer: Medicare HMO | Attending: Cardiology | Admitting: Cardiology

## 2022-08-02 ENCOUNTER — Telehealth: Payer: Self-pay

## 2022-08-02 VITALS — BP 110/70 | HR 65 | Ht 69.0 in | Wt 251.0 lb

## 2022-08-02 DIAGNOSIS — I5042 Chronic combined systolic (congestive) and diastolic (congestive) heart failure: Secondary | ICD-10-CM

## 2022-08-02 DIAGNOSIS — R0609 Other forms of dyspnea: Secondary | ICD-10-CM

## 2022-08-02 DIAGNOSIS — R079 Chest pain, unspecified: Secondary | ICD-10-CM | POA: Diagnosis not present

## 2022-08-02 DIAGNOSIS — I10 Essential (primary) hypertension: Secondary | ICD-10-CM

## 2022-08-02 DIAGNOSIS — E785 Hyperlipidemia, unspecified: Secondary | ICD-10-CM | POA: Diagnosis not present

## 2022-08-02 MED ORDER — ENTRESTO 24-26 MG PO TABS: 1.0000 | ORAL_TABLET | Freq: Two times a day (BID) | ORAL | 3 refills | Status: DC

## 2022-08-02 NOTE — Telephone Encounter (Signed)
Craig Hurley, [place]  07/05/2022 12:05 PM EDT Back to Top    Dr Lamonte Sakai, I called the patient to let him know about the sleep study and your recommendations. The patient and his wife decided that they do want to try the CPAP but would like to know if he can have the 2L O2   combined with the order.   Dierdre Highman, RN  06/29/2022 12:05 PM EDT     ATC LVMTCB x1    Collene Gobble, MD  06/21/2022  3:08 PM EDT     Please of the patient know that I reviewed his PSG.  His AHI is consistent with severe sleep apnea.  He would benefit from starting CPAP if he is willing to do so.  Please ask him if he would like to try CPAP.  If so then we can order auto titration 8-20 cmH2O, best fit mask.

## 2022-08-02 NOTE — Patient Instructions (Signed)
Medication Instructions:  STOP Losartan START Entresto 24/26 mg two times daily  *If you need a refill on your cardiac medications before your next appointment, please call your pharmacy*   Lab Work: Please return for labs in 1 week (BMET)  Our in office lab hours are Monday-Friday 8:00-4:00, closed for lunch 12:45-1:45 pm.  No appointment needed.  [facility] locations:   [employer] [place] ([place]) - [address] [place] [address] - [address] [place] - [address] ([facility])  Follow-Up: At [facility], you and your health needs are our priority.  As part of our continuing mission to provide you with exceptional heart care, we have created designated Provider Care Teams.  These Care Teams include your primary Cardiologist (physician) and Advanced Practice Providers (APPs -  Physician Assistants and Nurse Practitioners) who all work together to provide you with the care you need, when you need it.  We recommend signing up for the patient portal called "MyChart".  Sign up information is provided on this After Visit Summary.  MyChart is used to connect with patients for Virtual Visits (Telemedicine).  Patients are able to view lab/test results, encounter notes, upcoming appointments, etc.  Non-urgent messages can be sent to your provider as well.   To learn more about what you can do with MyChart, go to [email or web address].    Your next appointment:   4 month(s)  The format for your next appointment:   In Person  Provider:   Donato Heinz, MD      Important Information About Sugar

## 2022-08-02 NOTE — Telephone Encounter (Signed)
Typically we need to perform an overnight oximetry on CPAP to establish whether he needs oxygen added in.  I'd like for him to start CPAP first and then we will do that test, see if we should add the oxygen

## 2022-08-04 NOTE — Telephone Encounter (Signed)
Spoke with wife per DPR and informed her of Dr. Agustina Caroli advice. Pt has OV on 08/09/22 and will discuss with Dr. Lamonte Sakai at that time. Nothing further needed now.

## 2022-08-08 DIAGNOSIS — G5602 Carpal tunnel syndrome, left upper limb: Secondary | ICD-10-CM | POA: Diagnosis not present

## 2022-08-08 DIAGNOSIS — Z4789 Encounter for other orthopedic aftercare: Secondary | ICD-10-CM | POA: Diagnosis not present

## 2022-08-08 DIAGNOSIS — I10 Essential (primary) hypertension: Secondary | ICD-10-CM | POA: Diagnosis not present

## 2022-08-08 DIAGNOSIS — I5042 Chronic combined systolic (congestive) and diastolic (congestive) heart failure: Secondary | ICD-10-CM | POA: Diagnosis not present

## 2022-08-09 ENCOUNTER — Encounter: Payer: Self-pay | Admitting: Emergency Medicine

## 2022-08-09 ENCOUNTER — Other Ambulatory Visit: Payer: Self-pay | Admitting: *Deleted

## 2022-08-09 ENCOUNTER — Ambulatory Visit: Payer: Medicare HMO | Admitting: Emergency Medicine

## 2022-08-09 ENCOUNTER — Other Ambulatory Visit: Payer: Self-pay | Admitting: Family Medicine

## 2022-08-09 VITALS — BP 138/76 | HR 75 | Temp 97.6°F | Ht 68.0 in | Wt 248.8 lb

## 2022-08-09 DIAGNOSIS — J9611 Chronic respiratory failure with hypoxia: Secondary | ICD-10-CM | POA: Diagnosis not present

## 2022-08-09 DIAGNOSIS — J438 Other emphysema: Secondary | ICD-10-CM | POA: Diagnosis not present

## 2022-08-09 DIAGNOSIS — I5042 Chronic combined systolic (congestive) and diastolic (congestive) heart failure: Secondary | ICD-10-CM

## 2022-08-09 DIAGNOSIS — R918 Other nonspecific abnormal finding of lung field: Secondary | ICD-10-CM

## 2022-08-09 DIAGNOSIS — G4733 Obstructive sleep apnea (adult) (pediatric): Secondary | ICD-10-CM | POA: Diagnosis not present

## 2022-08-09 DIAGNOSIS — E870 Hyperosmolality and hypernatremia: Secondary | ICD-10-CM

## 2022-08-09 DIAGNOSIS — Z23 Encounter for immunization: Secondary | ICD-10-CM | POA: Diagnosis not present

## 2022-08-09 DIAGNOSIS — G4730 Sleep apnea, unspecified: Secondary | ICD-10-CM | POA: Insufficient documentation

## 2022-08-09 LAB — BASIC METABOLIC PANEL
BUN/Creatinine Ratio: 16 (ref 10–24)
BUN: 17 mg/dL (ref 8–27)
CO2: 21 mmol/L (ref 20–29)
Calcium: 9.2 mg/dL (ref 8.6–10.2)
Chloride: 109 mmol/L — ABNORMAL HIGH (ref 96–106)
Creatinine, Ser: 1.04 mg/dL (ref 0.76–1.27)
Glucose: 106 mg/dL — ABNORMAL HIGH (ref 70–99)
Potassium: 4.4 mmol/L (ref 3.5–5.2)
Sodium: 145 mmol/L — ABNORMAL HIGH (ref 134–144)
eGFR: 74 mL/min/{1.73_m2} (ref >59)

## 2022-08-09 NOTE — Assessment & Plan Note (Signed)
Continue oxygen 2 L/min.  He will also use at night while sleeping

## 2022-08-09 NOTE — Assessment & Plan Note (Signed)
Confirmed in July by PSG.  He tried CPAP but was unable to tolerate.  He wants to discontinue for now.  We will send an order to have his machine collected.  Consider retrying going forward.  He will continue oxygen at 2 L/min while sleeping.

## 2022-08-09 NOTE — Assessment & Plan Note (Signed)
Continue Symbicort, Spiriva patient have not taken them Keep albuterol available to use 2 puffs if needed for shortness of breath, chest tightness, wheezing.

## 2022-08-09 NOTE — Assessment & Plan Note (Signed)
Scattered pulmonary nodules remain stable.  The 12 mm left lower lobe nodule is stable over 6+ months but is larger than 2020.  Still somewhat suspicious.  We will continue to follow it, neck scan in 6 months March 2024.  If it changes then we will consider navigational bronchoscopy

## 2022-08-09 NOTE — Progress Notes (Signed)
Subjective:    [name], male    DOB: Apr 23, 1946, 76 y.o.   MRN: [id number]  HPI  ROV 05/09/22 --76 year old former smoker whom I have seen for right middle lobe and left lower lobe nodular opacities on CT (due for repeat in September), multifactorial dyspnea with presumed contribution from COPD and restrictive disease, deconditioning.  Currently managed on Symbicort and Spiriva.  Also on Singulair, Zyrtec, omeprazole.  He was qualified for oxygen (6 minute walk at [facility]), received a POC and using 2L/min w exertion.  We did ONO that indicated that he needed to wear oxygen at night (03/20/2022), started 2 L/min. Wife states that he does not snore, no witnessed apneas.   Pulmonary function testing performed today and reviewed by me, shows grossly normal spirometry without a bronchodilator response, normal flow volume loop, normal lung volumes, normal diffusion capacity.  ROV 08/09/2022 --follow-up visit 76 year old gentleman with a history of tobacco use and grossly normal spirometry, mild COPD.  Also with restrictive disease, deconditioning and exertional hypoxemia.  He has nodular pulmonary opacities on CT chest that we have been following as below.  He underwent PSG 06/19/2022 after he was known to desaturate on ONO.  This showed evidence for OSA and we ordered auto titration CPAP with oxygen bled in.  He reports that he was unable to tolerate it - couldn't stand the mask on his face.  Currently managed on Symbicort, Spiriva, Singulair, Zyrtec, omeprazole. Occasional albuterol use, not every day. Minimal cough, can be paroxysmal.   Super D CT chest 07/28/2022 reviewed by me, shows chronic right middle lobe scarring changes, stable 12 mm subpleural left lower lobe pulmonary nodule compared with 01/2022 but enlarged compared with 12/2018.  Stable underlying emphysematous changes and some peripheral interstitial disease.  Stable moderate to large hiatal hernia.   Review of  Systems As per HPI      Objective:   Physical Exam Vitals:   08/09/22 0952  BP: 138/76  Pulse: 75  Temp: 97.6 F (36.4 C)  TempSrc: Oral  SpO2: 95%  Weight: 248 lb 12.8 oz (112.9 kg)  Height: '5\' 8"'$  (1.727 m)    Gen: Pleasant, well-nourished, in no distress,  normal affect  ENT: No lesions,  mouth clear,  oropharynx clear, no postnasal drip  Neck: No JVD, no stridor  Lungs: No use of accessory muscles, no crackles or wheezing on normal respiration, no wheeze on forced expiration  Cardiovascular: RRR, heart sounds normal, no murmur or gallops, no peripheral edema  Musculoskeletal: No deformities, no cyanosis or clubbing  Neuro: alert, awake, non focal  Skin: Warm, no lesions or rash     Assessment & Plan:   COPD (chronic obstructive pulmonary disease) with emphysema (HCC) Continue Symbicort, Spiriva patient have not taken them Keep albuterol available to use 2 puffs if needed for shortness of breath, chest tightness, wheezing.   Multiple nodules of lung Scattered pulmonary nodules remain stable.  The 12 mm left lower lobe nodule is stable over 6+ months but is larger than 2020.  Still somewhat suspicious.  We will continue to follow it, neck scan in 6 months March 2024.  If it changes then we will consider navigational bronchoscopy  Chronic respiratory failure with hypoxia (HCC) Continue oxygen 2 L/min.  He will also use at night while sleeping  Sleep apnea Confirmed in July by PSG.  He tried CPAP but was unable to tolerate.  He wants to discontinue for now.  We will send  an order to have his machine collected.  Consider retrying going forward.  He will continue oxygen at 2 L/min while sleeping.    Baltazar Apo, MD, PhD 08/09/2022, 10:24 AM [facility] [phone] or if no answer before 7:00PM call [phone] For any issues after 7:00PM please call eLink [phone]

## 2022-08-09 NOTE — Patient Instructions (Signed)
We will send an order to stop your CPAP for now.  We may consider retrying it at some point in the future. Continue Symbicort, Spiriva patient have not taken them Keep albuterol available to use 2 puffs if needed for shortness of breath, chest tightness, wheezing. Continue Singulair and Zyrtec as you have been taking them We reviewed your CT scan of the chest today.  We will plan to repeat your CT scan in March 2024 to follow a small left lower lobe pulmonary nodule. Follow Dr. Lamonte Sakai in March after your CT so we can review the results together.

## 2022-08-09 NOTE — Addendum Note (Signed)
Addended by: Gavin Potters R on: 08/09/2022 11:00 AM   Modules accepted: Orders

## 2022-08-11 DIAGNOSIS — H524 Presbyopia: Secondary | ICD-10-CM | POA: Diagnosis not present

## 2022-08-11 DIAGNOSIS — H401121 Primary open-angle glaucoma, left eye, mild stage: Secondary | ICD-10-CM | POA: Diagnosis not present

## 2022-08-14 ENCOUNTER — Other Ambulatory Visit: Payer: Self-pay | Admitting: *Deleted

## 2022-08-14 DIAGNOSIS — I5042 Chronic combined systolic (congestive) and diastolic (congestive) heart failure: Secondary | ICD-10-CM

## 2022-08-14 DIAGNOSIS — E870 Hyperosmolality and hypernatremia: Secondary | ICD-10-CM

## 2022-08-17 ENCOUNTER — Telehealth: Payer: Self-pay | Admitting: Emergency Medicine

## 2022-08-18 NOTE — Telephone Encounter (Signed)
Called and spoke with pt letting him know the results of CT and he verbalized understanding. Notching further needed.

## 2022-09-04 DIAGNOSIS — M5412 Radiculopathy, cervical region: Secondary | ICD-10-CM | POA: Diagnosis not present

## 2022-09-04 DIAGNOSIS — I5042 Chronic combined systolic (congestive) and diastolic (congestive) heart failure: Secondary | ICD-10-CM | POA: Diagnosis not present

## 2022-09-04 DIAGNOSIS — E870 Hyperosmolality and hypernatremia: Secondary | ICD-10-CM | POA: Diagnosis not present

## 2022-09-04 DIAGNOSIS — M47812 Spondylosis without myelopathy or radiculopathy, cervical region: Secondary | ICD-10-CM | POA: Diagnosis not present

## 2022-09-04 LAB — BASIC METABOLIC PANEL
BUN/Creatinine Ratio: 20 (ref 10–24)
BUN: 19 mg/dL (ref 8–27)
CO2: 20 mmol/L (ref 20–29)
Calcium: 9.1 mg/dL (ref 8.6–10.2)
Chloride: 106 mmol/L (ref 96–106)
Creatinine, Ser: 0.95 mg/dL (ref 0.76–1.27)
Glucose: 106 mg/dL — ABNORMAL HIGH (ref 70–99)
Potassium: 4.3 mmol/L (ref 3.5–5.2)
Sodium: 140 mmol/L (ref 134–144)
eGFR: 82 mL/min/{1.73_m2} (ref >59)

## 2022-09-06 ENCOUNTER — Ambulatory Visit: Payer: Medicare HMO | Attending: Anesthesiology

## 2022-09-06 ENCOUNTER — Other Ambulatory Visit: Payer: Self-pay

## 2022-09-06 DIAGNOSIS — M542 Cervicalgia: Secondary | ICD-10-CM | POA: Diagnosis not present

## 2022-09-06 NOTE — Therapy (Signed)
OUTPATIENT PHYSICAL THERAPY CERVICAL EVALUATION   Patient Name: Kyshaun Barnette MRN: [id number] DOB:February 20, 1946, 76 y.o., male Today's Date: 09/06/2022   PT End of Session - 09/06/22 0946     Visit Number 1    Number of Visits 8    Date for PT Re-Evaluation 10/20/22    PT Start Time 0949    PT Stop Time 1030    PT Time Calculation (min) 41 min    Activity Tolerance Patient tolerated treatment well    Behavior During Therapy [facility] for tasks assessed/performed             Past Medical History:  Diagnosis Date   Allergy    Arthritis    Asthma    Cataract    Headache    Stroke [facility])    Past Surgical History:  Procedure Laterality Date   BACK SURGERY  2005   lumbar   EYE SURGERY     cataracts   FRACTURE SURGERY Right    SHOULDER ARTHROSCOPY Right    SKIN GRAFT FULL THICKNESS TRUNK     VASECTOMY     Patient Active Problem List   Diagnosis Date Noted   Sleep apnea 08/09/2022   Carpal tunnel syndrome of left wrist 07/24/2022   Chronic respiratory failure with hypoxia ([place]) 05/09/2022   Dyspnea on exertion 03/10/2022   Chronic combined systolic and diastolic heart failure ([place]) 07/28/2021   Palpitation 02/19/2021   History of CVA (cerebrovascular accident) 12/09/2020   Aphasia 12/09/2020   Memory loss 12/09/2020   Stenosis of left carotid artery 12/09/2020   Multiple nodules of lung 01/03/2019   Prediabetes 05/01/2018   Obesity 10/25/2016   COPD (chronic obstructive pulmonary disease) with emphysema ([place]) 11/08/2015   BPH (benign prostatic hyperplasia) 10/25/2015   GERD (gastroesophageal reflux disease) 10/25/2015    PCP: Dettinger, Fransisca Kaufmann, MD  REFERRING PROVIDER: Bebe Shaggy, MD  REFERRING DIAG: Spondylosis without myelopathy or radiculopathy, cervical region  THERAPY DIAG:  Cervicalgia  Rationale for Evaluation and Treatment Rehabilitation  ONSET DATE: a few months ago  SUBJECTIVE:                                                                                                                                                                                                          SUBJECTIVE STATEMENT: Patient reports that his neck has been bothering him for a few months now. He notes that his neck is primarily tender on the right side right now. However, it can cause him to have right sided headaches.   PERTINENT HISTORY:  History of CVA, CHF, COPD, obesity, OA, asthma  PAIN:  Are you having pain? Yes: NPRS scale: 4/10 Pain location: right side of his neck Pain description: sore and can cause headaches Aggravating factors: touching the right side of his neck, moving his head Relieving factors: Tylenol   PRECAUTIONS: None  WEIGHT BEARING RESTRICTIONS No  FALLS:  Has patient fallen in last 6 months? No  LIVING ENVIRONMENT: Lives with: lives with their family Lives in: House/apartment  OCCUPATION: retired  PLOF: Independent  PATIENT GOALS reduced pain  OBJECTIVE:   PATIENT SURVEYS:  FOTO 51.98   COGNITION: Overall cognitive status: Within functional limits for tasks assessed   SENSATION: Patient reports no numbness or tingling  POSTURE: rounded shoulders and forward head  PALPATION: TTP: right upper trapezius, levator scapulae, and C4 spinous process   CERVICAL ROM:   Active ROM A/PROM (deg) eval  Flexion 32  Extension 38; aggravates familiar pain  Right lateral flexion 24; aggravates familiar pain  Left lateral flexion 34  Right rotation 54; familiar pain  Left rotation 54   (Blank rows = not tested)  UPPER EXTREMITY ROM: WFL for activities assessed  UPPER EXTREMITY MMT:  MMT Right eval Left eval  Shoulder flexion 3+/5 4-/5  Shoulder extension    Shoulder abduction 3+/5 3+/5  Shoulder adduction    Shoulder extension    Shoulder internal rotation    Shoulder external rotation    Middle trapezius    Lower trapezius    Elbow flexion 3+/5 4-/5  Elbow extension 4+/5 5/5  Wrist  flexion    Wrist extension    Wrist ulnar deviation    Wrist radial deviation    Wrist pronation    Wrist supination    Grip strength     (Blank rows = not tested)  CERVICAL SPECIAL TESTS:  Spurling's test: Negative, Distraction test: Negative, and Sharp pursor's test: Negative  TODAY'S TREATMENT:  Modalities  Date: 09/06/22 Hot Pack: Cervical, 10 mins, Pain  PATIENT EDUCATION:  Education details: POC, prognosis, healing  Person educated: Patient Education method: Explanation Education comprehension: verbalized understanding   HOME EXERCISE PROGRAM:   ASSESSMENT:  CLINICAL IMPRESSION: Patient is a 76 y.o. male who was seen today for physical therapy evaluation and treatment for right sided cervical pain. He presented with moderate right upper trapezius pain severity and irritability with cervical AROM and palpation to the area being able to reproduce his familiar symptoms. Recommend that he continue with skilled physical therapy to address his remaining impairments to return to his prior level of function.   OBJECTIVE IMPAIRMENTS decreased mobility, decreased ROM, decreased strength, hypomobility, impaired tone, postural dysfunction, and pain.   ACTIVITY LIMITATIONS dressing  PARTICIPATION LIMITATIONS: cleaning, medication management, driving, and community activity  PERSONAL FACTORS Time since onset of injury/illness/exacerbation and 3+ comorbidities: History of CVA, CHF, COPD, obesity, OA, asthma  are also affecting patient's functional outcome.   REHAB POTENTIAL: Good  CLINICAL DECISION MAKING: Evolving/moderate complexity  EVALUATION COMPLEXITY: Moderate   GOALS: Goals reviewed with patient? No  LONG TERM GOALS: Target date: 10/04/2022  Patient will be independent with his HEP.  Baseline:   Goal status: INITIAL  2.  Patient will be able to demonstrate at least 60 degrees of cervical rotation bilaterally without being limited by his familiar pain.   Baseline:   Goal status: INITIAL  3.  Patient will be able to complete his daily activities without his familiar pain exceeding 2/10.  Baseline:   Goal status: INITIAL  4.  Patient will be able to demonstrate at least 40 degrees of cervical extension without being limited by his familiar pain for improved function with managing his eye drops.  Baseline:   Goal status: INITIAL  PLAN: PT FREQUENCY: 2x/week  PT DURATION: 4 weeks  PLANNED INTERVENTIONS: Therapeutic exercises, Therapeutic activity, Neuromuscular re-education, Patient/Family education, Joint mobilization, Dry Needling, Electrical stimulation, Spinal mobilization, Cryotherapy, Moist heat, Manual therapy, and Re-evaluation  PLAN FOR NEXT SESSION: UBE, rows, shoulder extension, chin tucks, cervical snags, and modalities as needed   Darlin Coco, PT 09/06/2022, 3:18 PM

## 2022-09-10 ENCOUNTER — Encounter: Payer: Self-pay | Admitting: Family Medicine

## 2022-09-11 ENCOUNTER — Ambulatory Visit: Payer: Medicare HMO

## 2022-09-11 DIAGNOSIS — M542 Cervicalgia: Secondary | ICD-10-CM

## 2022-09-11 NOTE — Therapy (Signed)
OUTPATIENT PHYSICAL THERAPY CERVICAL TREATMENT   Patient Name: Craig Hurley MRN: [id number] DOB:1946-10-24, 76 y.o., male Today's Date: 09/11/2022   PT End of Session - 09/11/22 0949     Visit Number 2    Number of Visits 8    Date for PT Re-Evaluation 10/20/22    PT Start Time 0945    PT Stop Time 4098    PT Time Calculation (min) 58 min    Activity Tolerance Patient tolerated treatment well    Behavior During Therapy [facility] for tasks assessed/performed             Past Medical History:  Diagnosis Date   Allergy    Arthritis    Asthma    Cataract    Headache    Stroke [facility])    Past Surgical History:  Procedure Laterality Date   BACK SURGERY  2005   lumbar   EYE SURGERY     cataracts   FRACTURE SURGERY Right    SHOULDER ARTHROSCOPY Right    SKIN GRAFT FULL THICKNESS TRUNK     VASECTOMY     Patient Active Problem List   Diagnosis Date Noted   Sleep apnea 08/09/2022   Carpal tunnel syndrome of left wrist 07/24/2022   Chronic respiratory failure with hypoxia ([place]) 05/09/2022   Dyspnea on exertion 03/10/2022   Chronic combined systolic and diastolic heart failure ([place]) 07/28/2021   Palpitation 02/19/2021   History of CVA (cerebrovascular accident) 12/09/2020   Aphasia 12/09/2020   Memory loss 12/09/2020   Stenosis of left carotid artery 12/09/2020   Multiple nodules of lung 01/03/2019   Prediabetes 05/01/2018   Obesity 10/25/2016   COPD (chronic obstructive pulmonary disease) with emphysema ([place]) 11/08/2015   BPH (benign prostatic hyperplasia) 10/25/2015   GERD (gastroesophageal reflux disease) 10/25/2015    PCP: Dettinger, Fransisca Kaufmann, MD  REFERRING PROVIDER: Bebe Shaggy, MD  REFERRING DIAG: Spondylosis without myelopathy or radiculopathy, cervical region  THERAPY DIAG:  Cervicalgia  Rationale for Evaluation and Treatment Rehabilitation  ONSET DATE: a few months ago  SUBJECTIVE:                                                                                                                                                                                                          SUBJECTIVE STATEMENT: Pt arrives for today's session reporting 4/10 right shoulder/neck pain.   PERTINENT HISTORY:  History of CVA, CHF, COPD, obesity, OA, asthma  PAIN:  Are you having pain? Yes: NPRS scale: 4/10 Pain location: right side of his neck Pain  description: sore and can cause headaches Aggravating factors: touching the right side of his neck, moving his head Relieving factors: Tylenol   PRECAUTIONS: None  WEIGHT BEARING RESTRICTIONS No  FALLS:  Has patient fallen in last 6 months? No  LIVING ENVIRONMENT: Lives with: lives with their family Lives in: House/apartment  OCCUPATION: retired  PLOF: Independent  PATIENT GOALS reduced pain  OBJECTIVE:   PATIENT SURVEYS:  FOTO 51.98   COGNITION: Overall cognitive status: Within functional limits for tasks assessed   SENSATION: Patient reports no numbness or tingling  POSTURE: rounded shoulders and forward head  PALPATION: TTP: right upper trapezius, levator scapulae, and C4 spinous process   CERVICAL ROM:   Active ROM A/PROM (deg) eval  Flexion 32  Extension 38; aggravates familiar pain  Right lateral flexion 24; aggravates familiar pain  Left lateral flexion 34  Right rotation 54; familiar pain  Left rotation 54   (Blank rows = not tested)  UPPER EXTREMITY ROM: WFL for activities assessed  UPPER EXTREMITY MMT:  MMT Right eval Left eval  Shoulder flexion 3+/5 4-/5  Shoulder extension    Shoulder abduction 3+/5 3+/5  Shoulder adduction    Shoulder extension    Shoulder internal rotation    Shoulder external rotation    Middle trapezius    Lower trapezius    Elbow flexion 3+/5 4-/5  Elbow extension 4+/5 5/5  Wrist flexion    Wrist extension    Wrist ulnar deviation    Wrist radial deviation    Wrist pronation    Wrist supination    Grip  strength     (Blank rows = not tested)  CERVICAL SPECIAL TESTS:  Spurling's test: Negative, Distraction test: Negative, and Sharp pursor's test: Negative  TODAY'S TREATMENT:                                     EXERCISE LOG  Exercise Repetitions and Resistance Comments  UBE 120 RPMs x 8 mins (forward/backward)   Chin Tucks 20 reps   Scap Retract 20 reps   Shoulder Shrugs 20 reps   Shoulder Rolls 20 reps forward and backward   Rows Red t-band x 20 reps   Extension Red t-band x 20 reps    Blank cell = exercise not performed today   Manual Therapy Soft Tissue Mobilization: right neck, STW/M to right upper trap, cervical paraspinals, and levator scap to decrease pain and tone    Modalities  Date:  Unattended Estim: Cervical, Pre-Mod 80-150 Hz, 15 mins, Pain and Tone Hot Pack: Cervical, 15 mins, Pain and Tone   PATIENT EDUCATION:  Education details: POC, prognosis, healing  Person educated: Patient Education method: Explanation Education comprehension: verbalized understanding   HOME EXERCISE PROGRAM:   ASSESSMENT:  CLINICAL IMPRESSION: Pt arrives for today's treatment session reporting 4/10 right neck and shoulder pain.  Pt able to tolerate UBE for warm-up today without issue or complaint.  Pt instructed in numerous seated cervical and postural exercises with min cues for proper technique and posture. STW/M performed to right upper trap, cervical paraspinals, and levator scap to decrease pain and tone.  Normal responses to estim and MH noted.  Pt reported 3/10 right neck and shoulder pain at completion of today's treatment session.    OBJECTIVE IMPAIRMENTS decreased mobility, decreased ROM, decreased strength, hypomobility, impaired tone, postural dysfunction, and pain.   ACTIVITY LIMITATIONS dressing  PARTICIPATION LIMITATIONS: cleaning, medication  management, driving, and community activity  PERSONAL FACTORS Time since onset of injury/illness/exacerbation and 3+  comorbidities: History of CVA, CHF, COPD, obesity, OA, asthma  are also affecting patient's functional outcome.   REHAB POTENTIAL: Good  CLINICAL DECISION MAKING: Evolving/moderate complexity  EVALUATION COMPLEXITY: Moderate   GOALS: Goals reviewed with patient? No  LONG TERM GOALS: Target date: 10/04/2022  Patient will be independent with his HEP.  Baseline:   Goal status: INITIAL  2.  Patient will be able to demonstrate at least 60 degrees of cervical rotation bilaterally without being limited by his familiar pain.  Baseline:   Goal status: INITIAL  3.  Patient will be able to complete his daily activities without his familiar pain exceeding 2/10.  Baseline:   Goal status: INITIAL  4.  Patient will be able to demonstrate at least 40 degrees of cervical extension without being limited by his familiar pain for improved function with managing his eye drops.  Baseline:   Goal status: INITIAL  PLAN: PT FREQUENCY: 2x/week  PT DURATION: 4 weeks  PLANNED INTERVENTIONS: Therapeutic exercises, Therapeutic activity, Neuromuscular re-education, Patient/Family education, Joint mobilization, Dry Needling, Electrical stimulation, Spinal mobilization, Cryotherapy, Moist heat, Manual therapy, and Re-evaluation  PLAN FOR NEXT SESSION: UBE, rows, shoulder extension, chin tucks, cervical snags, and modalities as needed   Kathrynn Ducking, PTA 09/11/2022, 10:58 AM

## 2022-09-13 ENCOUNTER — Ambulatory Visit: Payer: Medicare HMO

## 2022-09-13 DIAGNOSIS — M542 Cervicalgia: Secondary | ICD-10-CM | POA: Diagnosis not present

## 2022-09-13 NOTE — Therapy (Signed)
OUTPATIENT PHYSICAL THERAPY CERVICAL TREATMENT   Patient Name: Craig Hurley MRN: [id number] DOB:Jul 02, 1946, 76 y.o., male Today's Date: 09/13/2022   PT End of Session - 09/13/22 0943     Visit Number 3    Number of Visits 8    Date for PT Re-Evaluation 10/20/22    PT Start Time 0945    PT Stop Time 7628    PT Time Calculation (min) 45 min    Activity Tolerance Patient tolerated treatment well    Behavior During Therapy [facility] for tasks assessed/performed              Past Medical History:  Diagnosis Date   Allergy    Arthritis    Asthma    Cataract    Headache    Stroke [facility])    Past Surgical History:  Procedure Laterality Date   BACK SURGERY  2005   lumbar   EYE SURGERY     cataracts   FRACTURE SURGERY Right    SHOULDER ARTHROSCOPY Right    SKIN GRAFT FULL THICKNESS TRUNK     VASECTOMY     Patient Active Problem List   Diagnosis Date Noted   Sleep apnea 08/09/2022   Carpal tunnel syndrome of left wrist 07/24/2022   Chronic respiratory failure with hypoxia ([place]) 05/09/2022   Dyspnea on exertion 03/10/2022   Chronic combined systolic and diastolic heart failure ([place]) 07/28/2021   Palpitation 02/19/2021   History of CVA (cerebrovascular accident) 12/09/2020   Aphasia 12/09/2020   Memory loss 12/09/2020   Stenosis of left carotid artery 12/09/2020   Multiple nodules of lung 01/03/2019   Prediabetes 05/01/2018   Obesity 10/25/2016   COPD (chronic obstructive pulmonary disease) with emphysema ([place]) 11/08/2015   BPH (benign prostatic hyperplasia) 10/25/2015   GERD (gastroesophageal reflux disease) 10/25/2015    PCP: Dettinger, Fransisca Kaufmann, MD  REFERRING PROVIDER: Bebe Shaggy, MD  REFERRING DIAG: Spondylosis without myelopathy or radiculopathy, cervical region  THERAPY DIAG:  Cervicalgia  Rationale for Evaluation and Treatment Rehabilitation  ONSET DATE: a few months ago  SUBJECTIVE:                                                                                                                                                                                                          SUBJECTIVE STATEMENT: Patient reports that his neck is still tight and hurts.   PERTINENT HISTORY:  History of CVA, CHF, COPD, obesity, OA, asthma  PAIN:  Are you having pain? Yes: NPRS scale: 4/10 Pain location: right side of his neck  Pain description: sore and can cause headaches Aggravating factors: touching the right side of his neck, moving his head Relieving factors: Tylenol   PRECAUTIONS: None  WEIGHT BEARING RESTRICTIONS No  FALLS:  Has patient fallen in last 6 months? No  LIVING ENVIRONMENT: Lives with: lives with their family Lives in: House/apartment  OCCUPATION: retired  PLOF: Independent  PATIENT GOALS reduced pain  OBJECTIVE: all objective measures were assessed at his initial evaluation on 09/06/22 unless otherwise noted  PATIENT SURVEYS:  FOTO 51.98   COGNITION: Overall cognitive status: Within functional limits for tasks assessed   SENSATION: Patient reports no numbness or tingling  POSTURE: rounded shoulders and forward head  PALPATION: TTP: right upper trapezius, levator scapulae, and C4 spinous process   CERVICAL ROM:   Active ROM A/PROM (deg) eval  Flexion 32  Extension 38; aggravates familiar pain  Right lateral flexion 24; aggravates familiar pain  Left lateral flexion 34  Right rotation 54; familiar pain  Left rotation 54   (Blank rows = not tested)  UPPER EXTREMITY ROM: WFL for activities assessed  UPPER EXTREMITY MMT:  MMT Right eval Left eval  Shoulder flexion 3+/5 4-/5  Shoulder extension    Shoulder abduction 3+/5 3+/5  Shoulder adduction    Shoulder extension    Shoulder internal rotation    Shoulder external rotation    Middle trapezius    Lower trapezius    Elbow flexion 3+/5 4-/5  Elbow extension 4+/5 5/5  Wrist flexion    Wrist extension    Wrist ulnar deviation     Wrist radial deviation    Wrist pronation    Wrist supination    Grip strength     (Blank rows = not tested)  CERVICAL SPECIAL TESTS:  Spurling's test: Negative, Distraction test: Negative, and Sharp pursor's test: Negative  TODAY'S TREATMENT:                                    10/18 EXERCISE LOG  Exercise Repetitions and Resistance Comments  UBE X10 minutes @ 90 RPM   R upper trapezius stretch 4 x 30 seconds   Resisted row and extension Red t-band x 20 reps    Shoulder rolls  20 reps         Blank cell = exercise not performed today  Manual Therapy Soft Tissue Mobilization: right upper trapezius and levator scapulae, for reduced pain Joint Mobilizations: C4-T1, grade I-III CPA's  Modalities: no redness or adverse reaction to today's modalities  Date:  Unattended Estim: Cervical, pre mod @ 80-150 Hz, 10 mins, Pain and Tone Hot Pack: Cervical, 10 mins, Pain and Tone                                   10/16 EXERCISE LOG  Exercise Repetitions and Resistance Comments  UBE 120 RPMs x 8 mins (forward/backward)   Chin Tucks 20 reps   Scap Retract 20 reps   Shoulder Shrugs 20 reps   Shoulder Rolls 20 reps forward and backward   Rows Red t-band x 20 reps   Extension Red t-band x 20 reps    Blank cell = exercise not performed today   Manual Therapy Soft Tissue Mobilization: right neck, STW/M to right upper trap, cervical paraspinals, and levator scap to decrease pain and tone    Modalities  Date:  Unattended Estim: Cervical, Pre-Mod 80-150 Hz, 15 mins, Pain and Tone Hot Pack: Cervical, 15 mins, Pain and Tone   PATIENT EDUCATION:  Education details: POC, prognosis, healing  Person educated: Patient Education method: Explanation Education comprehension: verbalized understanding   HOME EXERCISE PROGRAM:   ASSESSMENT:  CLINICAL IMPRESSION: Treatment focused on familiar interventions for reduced pain and cervical mobility. Manual therapy was able to slightly reduce  his familiar pain with soft tissue mobilization being the most effective. This was followed by appropriately matched interventions for improved soft tissue extensibility. He reported feeling a little better upon the conclusion of treatment, but he notes that he was still hurting a little. He continues to require skilled physical therapy to address his remaining impairments to maximize his functional mobility.   OBJECTIVE IMPAIRMENTS decreased mobility, decreased ROM, decreased strength, hypomobility, impaired tone, postural dysfunction, and pain.   ACTIVITY LIMITATIONS dressing  PARTICIPATION LIMITATIONS: cleaning, medication management, driving, and community activity  PERSONAL FACTORS Time since onset of injury/illness/exacerbation and 3+ comorbidities: History of CVA, CHF, COPD, obesity, OA, asthma  are also affecting patient's functional outcome.   REHAB POTENTIAL: Good  CLINICAL DECISION MAKING: Evolving/moderate complexity  EVALUATION COMPLEXITY: Moderate   GOALS: Goals reviewed with patient? No  LONG TERM GOALS: Target date: 10/04/2022  Patient will be independent with his HEP.  Baseline:   Goal status: INITIAL  2.  Patient will be able to demonstrate at least 60 degrees of cervical rotation bilaterally without being limited by his familiar pain.  Baseline:   Goal status: INITIAL  3.  Patient will be able to complete his daily activities without his familiar pain exceeding 2/10.  Baseline:   Goal status: INITIAL  4.  Patient will be able to demonstrate at least 40 degrees of cervical extension without being limited by his familiar pain for improved function with managing his eye drops.  Baseline:   Goal status: INITIAL  PLAN: PT FREQUENCY: 2x/week  PT DURATION: 4 weeks  PLANNED INTERVENTIONS: Therapeutic exercises, Therapeutic activity, Neuromuscular re-education, Patient/Family education, Joint mobilization, Dry Needling, Electrical stimulation, Spinal mobilization,  Cryotherapy, Moist heat, Manual therapy, and Re-evaluation  PLAN FOR NEXT SESSION: UBE, rows, shoulder extension, chin tucks, cervical snags, and modalities as needed   Darlin Coco, PT 09/13/2022, 10:32 AM

## 2022-09-18 ENCOUNTER — Ambulatory Visit: Payer: Medicare HMO

## 2022-09-18 DIAGNOSIS — M542 Cervicalgia: Secondary | ICD-10-CM

## 2022-09-18 NOTE — Therapy (Signed)
OUTPATIENT PHYSICAL THERAPY CERVICAL TREATMENT   Patient Name: Craig Hurley MRN: [id number] DOB:1946-10-01, 76 y.o., male Today's Date: 09/18/2022   PT End of Session - 09/18/22 0947     Visit Number 4    Number of Visits 8    Date for PT Re-Evaluation 10/20/22    PT Start Time 0945    PT Stop Time 7425    PT Time Calculation (min) 57 min    Activity Tolerance Patient tolerated treatment well    Behavior During Therapy [facility] for tasks assessed/performed              Past Medical History:  Diagnosis Date   Allergy    Arthritis    Asthma    Cataract    Headache    Stroke [facility])    Past Surgical History:  Procedure Laterality Date   BACK SURGERY  2005   lumbar   EYE SURGERY     cataracts   FRACTURE SURGERY Right    SHOULDER ARTHROSCOPY Right    SKIN GRAFT FULL THICKNESS TRUNK     VASECTOMY     Patient Active Problem List   Diagnosis Date Noted   Sleep apnea 08/09/2022   Carpal tunnel syndrome of left wrist 07/24/2022   Chronic respiratory failure with hypoxia ([place]) 05/09/2022   Dyspnea on exertion 03/10/2022   Chronic combined systolic and diastolic heart failure ([place]) 07/28/2021   Palpitation 02/19/2021   History of CVA (cerebrovascular accident) 12/09/2020   Aphasia 12/09/2020   Memory loss 12/09/2020   Stenosis of left carotid artery 12/09/2020   Multiple nodules of lung 01/03/2019   Prediabetes 05/01/2018   Obesity 10/25/2016   COPD (chronic obstructive pulmonary disease) with emphysema ([place]) 11/08/2015   BPH (benign prostatic hyperplasia) 10/25/2015   GERD (gastroesophageal reflux disease) 10/25/2015    PCP: Dettinger, Fransisca Kaufmann, MD  REFERRING PROVIDER: Bebe Shaggy, MD  REFERRING DIAG: Spondylosis without myelopathy or radiculopathy, cervical region  THERAPY DIAG:  Cervicalgia  Rationale for Evaluation and Treatment Rehabilitation  ONSET DATE: a few months ago  SUBJECTIVE:                                                                                                                                                                                                          SUBJECTIVE STATEMENT: Patient reports that his neck is still tight and hurts. Reports being in the car a lot this weekend.  PERTINENT HISTORY:  History of CVA, CHF, COPD, obesity, OA, asthma  PAIN:  Are you having pain? Yes: NPRS scale:  4/10 Pain location: right side of his neck Pain description: sore and can cause headaches Aggravating factors: touching the right side of his neck, moving his head Relieving factors: Tylenol   PRECAUTIONS: None  WEIGHT BEARING RESTRICTIONS No  FALLS:  Has patient fallen in last 6 months? No  LIVING ENVIRONMENT: Lives with: lives with their family Lives in: House/apartment  OCCUPATION: retired  PLOF: Independent  PATIENT GOALS reduced pain  OBJECTIVE: all objective measures were assessed at his initial evaluation on 09/06/22 unless otherwise noted  PATIENT SURVEYS:  FOTO 51.98   COGNITION: Overall cognitive status: Within functional limits for tasks assessed   SENSATION: Patient reports no numbness or tingling  POSTURE: rounded shoulders and forward head  PALPATION: TTP: right upper trapezius, levator scapulae, and C4 spinous process   CERVICAL ROM:   Active ROM A/PROM (deg) eval  Flexion 32  Extension 38; aggravates familiar pain  Right lateral flexion 24; aggravates familiar pain  Left lateral flexion 34  Right rotation 54; familiar pain  Left rotation 54   (Blank rows = not tested)  UPPER EXTREMITY ROM: WFL for activities assessed  UPPER EXTREMITY MMT:  MMT Right eval Left eval  Shoulder flexion 3+/5 4-/5  Shoulder extension    Shoulder abduction 3+/5 3+/5  Shoulder adduction    Shoulder extension    Shoulder internal rotation    Shoulder external rotation    Middle trapezius    Lower trapezius    Elbow flexion 3+/5 4-/5  Elbow extension 4+/5 5/5  Wrist flexion     Wrist extension    Wrist ulnar deviation    Wrist radial deviation    Wrist pronation    Wrist supination    Grip strength     (Blank rows = not tested)  CERVICAL SPECIAL TESTS:  Spurling's test: Negative, Distraction test: Negative, and Sharp pursor's test: Negative  TODAY'S TREATMENT:                          10/23 EXERCISE LOG  Exercise Repetitions and Resistance Comments  UBE X10 minutes @ 90 RPM   Pulleys X5 mins   R upper trapezius stretch 4 x 30 seconds   Resisted row and extension Red t-band x 25 reps    Shoulder rolls  25 reps    Shoulder shrugs 25 reps   Horizontal Abduction 20 reps red t-band    Blank cell = exercise not performed today  Manual Therapy Soft Tissue Mobilization: right upper trapezius and levator scapulae, for reduced pain  Modalities: no redness or adverse reaction to today's modalities  Date:  Unattended Estim: Cervical, pre mod @ 80-150 Hz, 10 mins, Pain and Tone Hot Pack: Cervical, 10 mins, Pain and Tone                                  10/18 EXERCISE LOG  Exercise Repetitions and Resistance Comments  UBE X10 minutes @ 90 RPM   R upper trapezius stretch 4 x 30 seconds   Resisted row and extension Red t-band x 20 reps each   Shoulder rolls  20 reps    Shoulder Shrugs 20 reps    Blank cell = exercise not performed today  Manual Therapy Soft Tissue Mobilization: right upper trapezius and levator scapulae, for reduced pain Joint Mobilizations: C4-T1, grade I-III CPA's  Modalities: no redness or adverse reaction to today's modalities  Date:  Unattended Estim: Cervical, pre mod @ 80-150 Hz, 10 mins, Pain and Tone Hot Pack: Cervical, 10 mins, Pain and Tone                                     PATIENT EDUCATION:  Education details: POC, prognosis, healing  Person educated: Patient Education method: Explanation Education comprehension: verbalized understanding   HOME EXERCISE PROGRAM:   ASSESSMENT:  CLINICAL IMPRESSION: Pt arrives  for today's treatment session reporting 4/10 right shoulder and neck pain.  Pt able to tolerate increased reps with all exercises today with fatigue experienced.  Pt given rest breaks as needed due to fatigue.  STW/M performed right upper trap and levator scap to decrease pain and tone.  Normal responses to estim and MH noted upon removal.  Pt reported 2/10 right shoulder and neck pain at completion of today's treatment session.   OBJECTIVE IMPAIRMENTS decreased mobility, decreased ROM, decreased strength, hypomobility, impaired tone, postural dysfunction, and pain.   ACTIVITY LIMITATIONS dressing  PARTICIPATION LIMITATIONS: cleaning, medication management, driving, and community activity  PERSONAL FACTORS Time since onset of injury/illness/exacerbation and 3+ comorbidities: History of CVA, CHF, COPD, obesity, OA, asthma  are also affecting patient's functional outcome.   REHAB POTENTIAL: Good  CLINICAL DECISION MAKING: Evolving/moderate complexity  EVALUATION COMPLEXITY: Moderate   GOALS: Goals reviewed with patient? No  LONG TERM GOALS: Target date: 10/04/2022  Patient will be independent with his HEP.  Baseline:   Goal status: INITIAL  2.  Patient will be able to demonstrate at least 60 degrees of cervical rotation bilaterally without being limited by his familiar pain.  Baseline:   Goal status: INITIAL  3.  Patient will be able to complete his daily activities without his familiar pain exceeding 2/10.  Baseline:   Goal status: INITIAL  4.  Patient will be able to demonstrate at least 40 degrees of cervical extension without being limited by his familiar pain for improved function with managing his eye drops.  Baseline:   Goal status: INITIAL  PLAN: PT FREQUENCY: 2x/week  PT DURATION: 4 weeks  PLANNED INTERVENTIONS: Therapeutic exercises, Therapeutic activity, Neuromuscular re-education, Patient/Family education, Joint mobilization, Dry Needling, Electrical stimulation,  Spinal mobilization, Cryotherapy, Moist heat, Manual therapy, and Re-evaluation  PLAN FOR NEXT SESSION: UBE, rows, shoulder extension, chin tucks, cervical snags, and modalities as needed   Kathrynn Ducking, PTA 09/18/2022, 10:42 AM

## 2022-09-19 ENCOUNTER — Encounter: Payer: Self-pay | Admitting: Emergency Medicine

## 2022-09-20 ENCOUNTER — Ambulatory Visit: Payer: Medicare HMO

## 2022-09-20 DIAGNOSIS — M542 Cervicalgia: Secondary | ICD-10-CM

## 2022-09-20 NOTE — Therapy (Signed)
OUTPATIENT PHYSICAL THERAPY CERVICAL TREATMENT   Patient Name: Craig Hurley MRN: [id number] DOB:1946/05/18, 76 y.o., male Today's Date: 09/20/2022   PT End of Session - 09/20/22 0942     Visit Number 5    Number of Visits 8    Date for PT Re-Evaluation 10/20/22    PT Start Time 0945    PT Stop Time 2122    PT Time Calculation (min) 30 min    Activity Tolerance Patient tolerated treatment well    Behavior During Therapy [facility] for tasks assessed/performed               Past Medical History:  Diagnosis Date   Allergy    Arthritis    Asthma    Cataract    Headache    Stroke [facility])    Past Surgical History:  Procedure Laterality Date   BACK SURGERY  2005   lumbar   EYE SURGERY     cataracts   FRACTURE SURGERY Right    SHOULDER ARTHROSCOPY Right    SKIN GRAFT FULL THICKNESS TRUNK     VASECTOMY     Patient Active Problem List   Diagnosis Date Noted   Sleep apnea 08/09/2022   Carpal tunnel syndrome of left wrist 07/24/2022   Chronic respiratory failure with hypoxia ([place]) 05/09/2022   Dyspnea on exertion 03/10/2022   Chronic combined systolic and diastolic heart failure ([place]) 07/28/2021   Palpitation 02/19/2021   History of CVA (cerebrovascular accident) 12/09/2020   Aphasia 12/09/2020   Memory loss 12/09/2020   Stenosis of left carotid artery 12/09/2020   Multiple nodules of lung 01/03/2019   Prediabetes 05/01/2018   Obesity 10/25/2016   COPD (chronic obstructive pulmonary disease) with emphysema ([place]) 11/08/2015   BPH (benign prostatic hyperplasia) 10/25/2015   GERD (gastroesophageal reflux disease) 10/25/2015    PCP: Dettinger, Fransisca Kaufmann, MD  REFERRING PROVIDER: Bebe Shaggy, MD  REFERRING DIAG: Spondylosis without myelopathy or radiculopathy, cervical region  THERAPY DIAG:  Cervicalgia  Rationale for Evaluation and Treatment Rehabilitation  ONSET DATE: a few months ago  SUBJECTIVE:                                                                                                                                                                                                          SUBJECTIVE STATEMENT: Patient reports that his neck was really bothering him Monday night. He wants to be done with therapy until he see his referring physician next month.   PERTINENT HISTORY:  History of CVA, CHF, COPD, obesity, OA, asthma  PAIN:  Are you having pain? Yes: NPRS scale: 4/10 Pain location: right side of his neck Pain description: sore and can cause headaches Aggravating factors: touching the right side of his neck, moving his head Relieving factors: Tylenol   PRECAUTIONS: None  WEIGHT BEARING RESTRICTIONS No  FALLS:  Has patient fallen in last 6 months? No  LIVING ENVIRONMENT: Lives with: lives with their family Lives in: House/apartment  OCCUPATION: retired  PLOF: Independent  PATIENT GOALS reduced pain  OBJECTIVE: all objective measures were assessed at his initial evaluation on 09/06/22 unless otherwise noted  PATIENT SURVEYS:  FOTO 53.74   COGNITION: Overall cognitive status: Within functional limits for tasks assessed   SENSATION: Patient reports no numbness or tingling  POSTURE: rounded shoulders and forward head  PALPATION: TTP: right upper trapezius, levator scapulae, and C4 spinous process   CERVICAL ROM:   Active ROM A/PROM (deg) eval AROM (deg) 09/20/22  Flexion 32 10  Extension 38; aggravates familiar pain 54  Right lateral flexion 24; aggravates familiar pain 32; "stiff"  Left lateral flexion 34 32; "stiff"  Right rotation 54; familiar pain 58; "stiff"  Left rotation 54 57   (Blank rows = not tested)  UPPER EXTREMITY ROM: WFL for activities assessed  UPPER EXTREMITY MMT:  MMT Right eval Left eval  Shoulder flexion 3+/5 4-/5  Shoulder extension    Shoulder abduction 3+/5 3+/5  Shoulder adduction    Shoulder extension    Shoulder internal rotation    Shoulder external  rotation    Middle trapezius    Lower trapezius    Elbow flexion 3+/5 4-/5  Elbow extension 4+/5 5/5  Wrist flexion    Wrist extension    Wrist ulnar deviation    Wrist radial deviation    Wrist pronation    Wrist supination    Grip strength     (Blank rows = not tested)  CERVICAL SPECIAL TESTS:  Spurling's test: Negative, Distraction test: Negative, and Sharp pursor's test: Negative  TODAY'S TREATMENT:                                    10/25 EXERCISE LOG  Exercise Repetitions and Resistance Comments  UBE  X10 minutes @ 90 RPM                    Blank cell = exercise not performed today                           10/23 EXERCISE LOG  Exercise Repetitions and Resistance Comments  UBE X10 minutes @ 90 RPM   Pulleys X5 mins   R upper trapezius stretch 4 x 30 seconds   Resisted row and extension Red t-band x 25 reps    Shoulder rolls  25 reps    Shoulder shrugs 25 reps   Horizontal Abduction 20 reps red t-band    Blank cell = exercise not performed today  Manual Therapy Soft Tissue Mobilization: right upper trapezius and levator scapulae, for reduced pain  Modalities: no redness or adverse reaction to today's modalities  Date:  Unattended Estim: Cervical, pre mod @ 80-150 Hz, 10 mins, Pain and Tone Hot Pack: Cervical, 10 mins, Pain and Tone  10/18 EXERCISE LOG  Exercise Repetitions and Resistance Comments  UBE X10 minutes @ 90 RPM   R upper trapezius stretch 4 x 30 seconds   Resisted row and extension Red t-band x 20 reps each   Shoulder rolls  20 reps    Shoulder Shrugs 20 reps    Blank cell = exercise not performed today  Manual Therapy Soft Tissue Mobilization: right upper trapezius and levator scapulae, for reduced pain Joint Mobilizations: C4-T1, grade I-III CPA's  Modalities: no redness or adverse reaction to today's modalities  Date:  Unattended Estim: Cervical, pre mod @ 80-150 Hz, 10 mins, Pain and Tone Hot Pack:  Cervical, 10 mins, Pain and Tone                                     PATIENT EDUCATION:  Education details: progress with therapy, injection Person educated: Patient Education method: Explanation Education comprehension: verbalized understanding   HOME EXERCISE PROGRAM:   ASSESSMENT:  CLINICAL IMPRESSION: Patient presented to treatment requesting to be discharged from skilled physical therapy due to a lack of progress. He had made minimal progress as evidenced by his subjective reports, objective measures, and progress toward his goals. His HEP was reviewed and he was educated on the importance of continued activity. He reported feeling comfortable being discharged at this time with his current HEP.   PHYSICAL THERAPY DISCHARGE SUMMARY  Visits from Start of Care: 5  Current functional level related to goals / functional outcomes: Patient requested to be discharged due to lack of progress with skilled physical therapy.    Remaining deficits: Cervical pain and ROM    Education / Equipment: HEP   Patient agrees to discharge. Patient goals were partially met. Patient is being discharged due to the patient's request.   OBJECTIVE IMPAIRMENTS decreased mobility, decreased ROM, decreased strength, hypomobility, impaired tone, postural dysfunction, and pain.   ACTIVITY LIMITATIONS dressing  PARTICIPATION LIMITATIONS: cleaning, medication management, driving, and community activity  PERSONAL FACTORS Time since onset of injury/illness/exacerbation and 3+ comorbidities: History of CVA, CHF, COPD, obesity, OA, asthma  are also affecting patient's functional outcome.   REHAB POTENTIAL: Good  CLINICAL DECISION MAKING: Evolving/moderate complexity  EVALUATION COMPLEXITY: Moderate   GOALS: Goals reviewed with patient? No  LONG TERM GOALS: Target date: 10/04/2022  Patient will be independent with his HEP.  Baseline:   Goal status: MET  2.  Patient will be able to demonstrate at  least 60 degrees of cervical rotation bilaterally without being limited by his familiar pain.  Baseline:   Goal status: NOT MET  3.  Patient will be able to complete his daily activities without his familiar pain exceeding 2/10.  Baseline:   Goal status: NOT MET  4.  Patient will be able to demonstrate at least 40 degrees of cervical extension without being limited by his familiar pain for improved function with managing his eye drops.  Baseline:   Goal status: MET  PLAN: PT FREQUENCY: 2x/week  PT DURATION: 4 weeks  PLANNED INTERVENTIONS: Therapeutic exercises, Therapeutic activity, Neuromuscular re-education, Patient/Family education, Joint mobilization, Dry Needling, Electrical stimulation, Spinal mobilization, Cryotherapy, Moist heat, Manual therapy, and Re-evaluation  PLAN FOR NEXT SESSION: UBE, rows, shoulder extension, chin tucks, cervical snags, and modalities as needed   Darlin Coco, PT 09/20/2022, 3:14 PM

## 2022-09-26 ENCOUNTER — Ambulatory Visit (INDEPENDENT_AMBULATORY_CARE_PROVIDER_SITE_OTHER): Payer: Medicare HMO | Admitting: Nurse Practitioner

## 2022-09-26 ENCOUNTER — Encounter: Payer: Self-pay | Admitting: Nurse Practitioner

## 2022-09-26 VITALS — BP 104/67 | Temp 97.1°F | Ht 68.0 in | Wt 249.0 lb

## 2022-09-26 DIAGNOSIS — J441 Chronic obstructive pulmonary disease with (acute) exacerbation: Secondary | ICD-10-CM

## 2022-09-26 MED ORDER — PREDNISONE 20 MG PO TABS: 20.0000 mg | ORAL_TABLET | Freq: Every day | ORAL | 0 refills | Status: DC

## 2022-09-26 MED ORDER — DOXYCYCLINE HYCLATE 100 MG PO TABS: 100.0000 mg | ORAL_TABLET | Freq: Two times a day (BID) | ORAL | 0 refills | Status: DC

## 2022-09-26 NOTE — Patient Instructions (Signed)

## 2022-09-26 NOTE — Progress Notes (Signed)
Acute Office Visit  Subjective:     [name], male    DOB: 1946-10-22, 75 y.o.   MRN: [id number]  Chief Complaint  Patient presents with   Cough    2-3 days     Cough This is a new problem. The current episode started yesterday. The problem occurs constantly. The cough is Productive of sputum. Associated symptoms include nasal congestion. Pertinent negatives include no chest pain, chills, ear congestion, fever or rash. Nothing aggravates the symptoms. His past medical history is significant for COPD.    Review of Systems  Constitutional: Negative.  Negative for chills, fever and malaise/fatigue.  HENT: Negative.    Eyes: Negative.   Respiratory:  Positive for cough.   Cardiovascular:  Negative for chest pain.  Skin: Negative.  Negative for rash.  All other systems reviewed and are negative.       Objective:    BP 104/67   Temp (!) 97.1 F (36.2 C) (Temporal)   Ht '5\' 8"'$  (1.727 m)   Wt 249 lb (112.9 kg)   SpO2 96%   BMI 37.86 kg/m  BP Readings from Last 3 Encounters:  09/26/22 104/67  08/09/22 138/76  08/02/22 110/70   Wt Readings from Last 3 Encounters:  09/26/22 249 lb (112.9 kg)  08/09/22 248 lb 12.8 oz (112.9 kg)  08/02/22 251 lb (113.9 kg)      Physical Exam Vitals and nursing note reviewed.  Constitutional:      Appearance: Normal appearance.  HENT:     Head: Normocephalic.     Right Ear: External ear normal.     Left Ear: External ear normal.     Nose: Congestion present.  Eyes:     Conjunctiva/sclera: Conjunctivae normal.  Cardiovascular:     Rate and Rhythm: Normal rate and regular rhythm.     Pulses: Normal pulses.     Heart sounds: Normal heart sounds.  Abdominal:     General: Bowel sounds are normal.  Skin:    General: Skin is warm.     Findings: No erythema or rash.  Neurological:     Mental Status: He is alert and oriented to person, place, and time.     No results found for any visits on 09/26/22.       Assessment & Plan:  Patient parents with COPD excerebration.with congested cough. He denies fever and flu like symptoms.  Take meds as prescribed - Use a cool mist humidifier  -Use saline nose sprays frequently -Force fluids -For fever or aches or pains- take Tylenol or ibuprofen. -If symptoms do not improve, she may need to be COVID tested to rule this out Follow up with worsening unresolved symptoms  Problem List Items Addressed This Visit   None Visit Diagnoses     COPD with acute exacerbation ([place])    -  Primary   Relevant Medications   doxycycline (VIBRA-TABS) 100 MG tablet   predniSONE (DELTASONE) 20 MG tablet       Meds ordered this encounter  Medications   doxycycline (VIBRA-TABS) 100 MG tablet    Sig: Take 1 tablet (100 mg total) by mouth 2 (two) times daily.    Dispense:  14 tablet    Refill:  0    Order Specific Question:   Supervising Provider    Answer:   Claretta Fraise [[id number]]   predniSONE (DELTASONE) 20 MG tablet    Sig: Take 1 tablet (20 mg total) by mouth daily with breakfast.  Dispense:  6 tablet    Refill:  0    Order Specific Question:   Supervising Provider    Answer:   Claretta Fraise [[id number]]    Return if symptoms worsen or fail to improve.  Ivy Lynn, NP

## 2022-09-27 ENCOUNTER — Ambulatory Visit: Payer: Medicare HMO | Admitting: Family Medicine

## 2022-10-02 DIAGNOSIS — M47812 Spondylosis without myelopathy or radiculopathy, cervical region: Secondary | ICD-10-CM | POA: Diagnosis not present

## 2022-10-02 DIAGNOSIS — M542 Cervicalgia: Secondary | ICD-10-CM | POA: Diagnosis not present

## 2022-10-03 ENCOUNTER — Telehealth: Payer: Self-pay

## 2022-10-03 NOTE — Telephone Encounter (Signed)
Mailed [employer] renewal application for Jardiance and Spiriva to patients home.

## 2022-10-09 ENCOUNTER — Other Ambulatory Visit: Payer: Self-pay | Admitting: Family Medicine

## 2022-10-09 NOTE — Telephone Encounter (Signed)
Received a MyChart message from patient and his wife in regards to his oxygen. The patient wants to keep his POC but discontinue the nighttime oxygen. He has not been using it due to the discomfort and not being able to sleep.   Dr. Lamonte Sakai, please advise if you are ok with [place] placing an order to D/C the nighttime oxygen. Thanks!

## 2022-10-13 ENCOUNTER — Telehealth: Payer: Self-pay | Admitting: Family Medicine

## 2022-10-13 DIAGNOSIS — J439 Emphysema, unspecified: Secondary | ICD-10-CM

## 2022-10-13 DIAGNOSIS — J438 Other emphysema: Secondary | ICD-10-CM

## 2022-10-13 DIAGNOSIS — J441 Chronic obstructive pulmonary disease with (acute) exacerbation: Secondary | ICD-10-CM

## 2022-10-13 NOTE — Telephone Encounter (Signed)
Symbicort not going to be covered starting next year. He has samples so does not need any at this time.  Craig Hurley also manages pts Spiriva, Vania Rea   Wife states that she does have some paperwork that she needs to complete.  Advised that she and husband may need to have a new referral placed and an appt with Craig Hurley.  Wife would like for Craig Hurley to review and then call back

## 2022-10-13 NOTE — Telephone Encounter (Signed)
Patient would like to talk to Almyra Free about getting assistance with a medication. Please call back and advise.

## 2022-10-16 NOTE — Telephone Encounter (Signed)
He has desaturations on ONO, as well as OSA on PSG. We had initially tried him on CPAP + O2 but he was unable to wear. He was using O2 only instead. I recommend that he continue to use, but if he is unable to tolerate and is not going to wear O2, then I am d/c'ing the nocturnal O2 at the patient';s request. Again, my medical advice is that the patient use O2 (in absence of CPAP) while sleeping.

## 2022-10-17 NOTE — Telephone Encounter (Addendum)
Faxed refills for jardiance and spiriva to [facility] in meantime while waiting on patient to complete re-enrollment app.

## 2022-10-17 NOTE — Telephone Encounter (Signed)
Wife informed of recommendations. New CCM referral placed.

## 2022-10-25 ENCOUNTER — Other Ambulatory Visit: Payer: Self-pay | Admitting: Cardiology

## 2022-10-30 ENCOUNTER — Other Ambulatory Visit: Payer: Self-pay | Admitting: Family Medicine

## 2022-10-30 DIAGNOSIS — J439 Emphysema, unspecified: Secondary | ICD-10-CM

## 2022-11-03 ENCOUNTER — Telehealth: Payer: Self-pay

## 2022-11-03 NOTE — Progress Notes (Signed)
  Chronic Care Management   Note  11/03/2022 Name: Craig Hurley MRN: [id number] DOB: October 25, 1946  Craig Hurley is a 76 y.o. year old male who is a primary care patient of Dettinger, Fransisca Kaufmann, MD. I reached out to Craig Hurley by phone today in response to a referral sent by Craig Hurley's PCP.  Craig Hurley  agreedto scheduling an appointment with the CCM RN Case Manager and Pharm D    Follow up plan: Patient agreed to scheduled appointment with RN Case Manager on 11/23/2022 and Pharm d 11/30/2022(date/time).   Craig Hurley, [place], [place] [address] Direct Dial: [phone] [email or web address]

## 2022-11-03 NOTE — Progress Notes (Signed)
  Chronic Care Management   Note  11/03/2022 Name: Tiara Bartoli MRN: [id number] DOB: February 01, 1946  Christophr Calix is a 76 y.o. year old male who is a primary care patient of Dettinger, Fransisca Kaufmann, MD. I reached out to Lamont Dowdy by phone today in response to a referral sent by Mr. Dene Trick's PCP.  Mr. Emeril Stille was not successfully contacted today. A HIPAA compliant voice message was left requesting a return call.   Follow up plan: Additional outreach attempts will be made.  Noreene Larsson, [place], [place] [address] Direct Dial: [phone] [email or web address]

## 2022-11-09 ENCOUNTER — Ambulatory Visit (INDEPENDENT_AMBULATORY_CARE_PROVIDER_SITE_OTHER): Payer: Medicare HMO | Admitting: Family Medicine

## 2022-11-09 ENCOUNTER — Encounter: Payer: Self-pay | Admitting: Family Medicine

## 2022-11-09 VITALS — BP 109/74 | HR 81 | Temp 98.3°F | Ht 68.0 in | Wt 247.0 lb

## 2022-11-09 DIAGNOSIS — K219 Gastro-esophageal reflux disease without esophagitis: Secondary | ICD-10-CM

## 2022-11-09 DIAGNOSIS — R7303 Prediabetes: Secondary | ICD-10-CM | POA: Diagnosis not present

## 2022-11-09 DIAGNOSIS — I5042 Chronic combined systolic (congestive) and diastolic (congestive) heart failure: Secondary | ICD-10-CM

## 2022-11-09 DIAGNOSIS — N401 Enlarged prostate with lower urinary tract symptoms: Secondary | ICD-10-CM | POA: Diagnosis not present

## 2022-11-09 DIAGNOSIS — J438 Other emphysema: Secondary | ICD-10-CM

## 2022-11-09 DIAGNOSIS — R350 Frequency of micturition: Secondary | ICD-10-CM

## 2022-11-09 LAB — BAYER DCA HB A1C WAIVED: HB A1C (BAYER DCA - WAIVED): 6 % — ABNORMAL HIGH (ref 4.8–5.6)

## 2022-11-09 MED ORDER — ATORVASTATIN CALCIUM 10 MG PO TABS: 10.0000 mg | ORAL_TABLET | Freq: Every day | ORAL | 3 refills | Status: DC

## 2022-11-09 MED ORDER — MELOXICAM 15 MG PO TABS: 15.0000 mg | ORAL_TABLET | Freq: Every day | ORAL | 3 refills | Status: DC

## 2022-11-09 MED ORDER — DOXAZOSIN MESYLATE 4 MG PO TABS: 4.0000 mg | ORAL_TABLET | Freq: Every day | ORAL | 3 refills | Status: AC

## 2022-11-09 NOTE — Progress Notes (Signed)
BP 109/74   Pulse 81   Temp 98.3 F (36.8 C)   Ht 5' 8" (1.727 m)   Wt 247 lb (112 kg)   SpO2 96%   BMI 37.56 kg/m    Subjective:   [name], male    DOB: 1946/02/17, 76 y.o.   MRN: [id number]  HPI: [name] is a 76 y.o. male presenting on 11/09/2022 for Medical Management of Chronic Issues, Prediabetes, and Leg Pain (bilateral)   HPI Prediabetes Patient comes in today for recheck of his diabetes. Patient has been currently taking Jardiance. Patient is currently on an ACE inhibitor/ARB. Patient has seen an ophthalmologist this year. Patient denies any issues with their feet. The symptom started onset as an adult CHF and COPD and history of CVA ARE RELATED TO DM   CHF Patient is currently on carvedilol and Entresto and spironolactone and doxazosin, and their blood pressure today is 109/74. Patient denies any lightheadedness or dizziness. Patient denies headaches, blurred vision, chest pains, shortness of breath, or weakness. Denies any side effects from medication and is content with current medication.   COPD Patient is coming in for COPD recheck today.  He is currently on albuterol and Singulair and Spiriva.  He has a mild chronic cough but denies any major coughing spells or wheezing spells.  He has 0 nighttime symptoms per week and 0 daytime symptoms per week currently.   BPH Patient is coming in for recheck on BPH Symptoms: None currently Medication: Doxazosin   GERD Patient is currently on omeprazole.  She denies any major symptoms or abdominal pain or belching or burping. She denies any blood in her stool or lightheadedness or dizziness.   Relevant past medical, surgical, family and social history reviewed and updated as indicated. Interim medical history since our last visit reviewed. Allergies and medications reviewed and updated.  Review of Systems  Constitutional:  Negative for chills and fever.  Eyes:  Negative for visual  disturbance.  Respiratory:  Negative for shortness of breath and wheezing.   Cardiovascular:  Negative for chest pain and leg swelling.  Musculoskeletal:  Positive for arthralgias (Chronic arthralgias, has specialist for this), back pain, myalgias and neck pain. Negative for gait problem.  Skin:  Negative for rash.  Neurological:  Negative for dizziness, weakness and light-headedness.  All other systems reviewed and are negative.   Per HPI unless specifically indicated above   Allergies as of 11/09/2022       Reactions   Penicillins Swelling        Medication List        Accurate as of November 09, 2022 10:06 AM. If you have any questions, ask your nurse or doctor.          STOP taking these medications    doxycycline 100 MG tablet Commonly known as: VIBRA-TABS Stopped by: Fransisca Kaufmann Makinzey Banes, MD   predniSONE 20 MG tablet Commonly known as: DELTASONE Stopped by: Fransisca Kaufmann Yuvonne Lanahan, MD       TAKE these medications    acetaminophen 325 MG tablet Commonly known as: TYLENOL Take 650 mg by mouth every 6 (six) hours as needed.   albuterol 108 (90 Base) MCG/ACT inhaler Commonly known as: VENTOLIN HFA Inhale 2 puffs into the lungs every 4 (four) hours as needed for wheezing or shortness of breath.   aspirin EC 325 MG tablet Take 1 tablet (325 mg total) by mouth daily.   atorvastatin 10 MG tablet Commonly known as: LIPITOR Take  1 tablet (10 mg total) by mouth daily.   B-12 PO Take 1,000 mcg by mouth daily.   budesonide-formoterol 160-4.5 MCG/ACT inhaler Commonly known as: SYMBICORT Inhale 2 puffs into the lungs 2 (two) times daily.   carvedilol 3.125 MG tablet Commonly known as: COREG Take 1 tablet by mouth twice daily   cetirizine 10 MG tablet Commonly known as: ZYRTEC Take 1 tablet (10 mg total) by mouth daily.   doxazosin 4 MG tablet Commonly known as: CARDURA Take 1 tablet (4 mg total) by mouth daily.   empagliflozin 10 MG Tabs tablet Commonly  known as: Jardiance Take 1 tablet (10 mg total) by mouth daily before breakfast.   Entresto 24-26 MG Generic drug: sacubitril-valsartan Take 1 tablet by mouth 2 (two) times daily.   latanoprost 0.005 % ophthalmic solution Commonly known as: XALATAN Place 1 drop into the left eye at bedtime.   meloxicam 15 MG tablet Commonly known as: MOBIC Take 1 tablet (15 mg total) by mouth daily.   montelukast 10 MG tablet Commonly known as: SINGULAIR TAKE 1 TABLET AT BEDTIME   omeprazole 20 MG capsule Commonly known as: PRILOSEC TAKE 1 CAPSULE TWICE DAILY BEFORE MEALS   PRESERVISION AREDS 2 PO Take 1 capsule by mouth 2 (two) times daily.   Spiriva Respimat 2.5 MCG/ACT Aers Generic drug: Tiotropium Bromide Monohydrate Inhale 2 puffs into the lungs daily.   spironolactone 25 MG tablet Commonly known as: ALDACTONE Take 1/2 (one-half) tablet by mouth once daily   vitamin A 8000 UNIT capsule Take 8,000 Units by mouth daily.         Objective:   BP 109/74   Pulse 81   Temp 98.3 F (36.8 C)   Ht 5' 8" (1.727 m)   Wt 247 lb (112 kg)   SpO2 96%   BMI 37.56 kg/m   Wt Readings from Last 3 Encounters:  11/09/22 247 lb (112 kg)  09/26/22 249 lb (112.9 kg)  08/09/22 248 lb 12.8 oz (112.9 kg)    Physical Exam Vitals and nursing note reviewed.  Constitutional:      General: He is not in acute distress.    Appearance: He is well-developed. He is not diaphoretic.  Eyes:     General: No scleral icterus.    Conjunctiva/sclera: Conjunctivae normal.  Neck:     Thyroid: No thyromegaly.  Cardiovascular:     Rate and Rhythm: Normal rate and regular rhythm.     Heart sounds: Normal heart sounds. No murmur heard. Pulmonary:     Effort: Pulmonary effort is normal. No respiratory distress.     Breath sounds: Normal breath sounds. No wheezing or rhonchi.  Musculoskeletal:        General: No swelling. Normal range of motion.     Cervical back: Neck supple.  Lymphadenopathy:      Cervical: No cervical adenopathy.  Skin:    General: Skin is warm and dry.     Findings: No rash.  Neurological:     Mental Status: He is alert and oriented to person, place, and time.     Coordination: Coordination normal.  Psychiatric:        Behavior: Behavior normal.       Assessment & Plan:   Problem List Items Addressed This Visit       Cardiovascular and Mediastinum   Chronic combined systolic and diastolic heart failure (HCC)   Relevant Medications   atorvastatin (LIPITOR) 10 MG tablet   doxazosin (CARDURA) 4 MG tablet  Other Relevant Orders   CBC with Differential/Platelet   CMP14+EGFR   Lipid panel     Respiratory   COPD (chronic obstructive pulmonary disease) with emphysema (HCC)     Digestive   GERD (gastroesophageal reflux disease)   Relevant Orders   CBC with Differential/Platelet   CMP14+EGFR     Genitourinary   BPH (benign prostatic hyperplasia)   Relevant Orders   PSA, total and free     Other   Prediabetes - Primary   Relevant Orders   CMP14+EGFR   Lipid panel   Bayer DCA Hb A1c Waived    Will check blood work today.  Blood pressure on the lower side but cardiology is maintaining and managing because of his CHF. Follow up plan: Return in about 6 months (around 05/11/2023), or if symptoms worsen or fail to improve, for Prediabetes and CHF and COPD recheck.  Counseling provided for all of the vaccine components Orders Placed This Encounter  Procedures   CBC with Differential/Platelet   CMP14+EGFR   Lipid panel   Bayer DCA Hb A1c Waived   PSA, total and free    Caryl Pina, MD [facility] 11/09/2022, 10:06 AM

## 2022-11-10 ENCOUNTER — Encounter: Payer: Self-pay | Admitting: Family Medicine

## 2022-11-10 LAB — CBC WITH DIFFERENTIAL/PLATELET
Basophils Absolute: 0.1 10*3/uL (ref 0.0–0.2)
Basos: 1 %
EOS (ABSOLUTE): 0.1 10*3/uL (ref 0.0–0.4)
Eos: 1 %
Hematocrit: 46.1 % (ref 37.5–51.0)
Hemoglobin: 15.3 g/dL (ref 13.0–17.7)
Immature Grans (Abs): 0 10*3/uL (ref 0.0–0.1)
Immature Granulocytes: 0 %
Lymphocytes Absolute: 1.4 10*3/uL (ref 0.7–3.1)
Lymphs: 15 %
MCH: 28.1 pg (ref 26.6–33.0)
MCHC: 33.2 g/dL (ref 31.5–35.7)
MCV: 85 fL (ref 79–97)
Monocytes Absolute: 0.7 10*3/uL (ref 0.1–0.9)
Monocytes: 7 %
Neutrophils Absolute: 6.6 10*3/uL (ref 1.4–7.0)
Neutrophils: 76 %
Platelets: 207 10*3/uL (ref 150–450)
RBC: 5.44 x10E6/uL (ref 4.14–5.80)
RDW: 14.7 % (ref 11.6–15.4)
WBC: 8.8 10*3/uL (ref 3.4–10.8)

## 2022-11-10 LAB — CMP14+EGFR
ALT: 10 IU/L (ref 0–44)
AST: 9 IU/L (ref 0–40)
Albumin/Globulin Ratio: 1.8 (ref 1.2–2.2)
Albumin: 4.1 g/dL (ref 3.8–4.8)
Alkaline Phosphatase: 93 IU/L (ref 44–121)
BUN/Creatinine Ratio: 21 (ref 10–24)
BUN: 19 mg/dL (ref 8–27)
Bilirubin Total: 0.3 mg/dL (ref 0.0–1.2)
CO2: 18 mmol/L — ABNORMAL LOW (ref 20–29)
Calcium: 8.8 mg/dL (ref 8.6–10.2)
Chloride: 106 mmol/L (ref 96–106)
Creatinine, Ser: 0.92 mg/dL (ref 0.76–1.27)
Globulin, Total: 2.3 g/dL (ref 1.5–4.5)
Glucose: 112 mg/dL — ABNORMAL HIGH (ref 70–99)
Potassium: 4.4 mmol/L (ref 3.5–5.2)
Sodium: 140 mmol/L (ref 134–144)
Total Protein: 6.4 g/dL (ref 6.0–8.5)
eGFR: 86 mL/min/{1.73_m2} (ref >59)

## 2022-11-10 LAB — PSA, TOTAL AND FREE
PSA, Free Pct: 34 %
PSA, Free: 0.51 ng/mL
Prostate Specific Ag, Serum: 1.5 ng/mL (ref 0.0–4.0)

## 2022-11-10 LAB — LIPID PANEL
Chol/HDL Ratio: 2.9 ratio (ref 0.0–5.0)
Cholesterol, Total: 155 mg/dL (ref 100–199)
HDL: 53 mg/dL (ref >39)
LDL Chol Calc (NIH): 83 mg/dL (ref 0–99)
Triglycerides: 103 mg/dL (ref 0–149)
VLDL Cholesterol Cal: 19 mg/dL (ref 5–40)

## 2022-11-15 ENCOUNTER — Ambulatory Visit: Payer: Medicare HMO | Attending: Cardiology | Admitting: Cardiology

## 2022-11-15 ENCOUNTER — Encounter: Payer: Self-pay | Admitting: Cardiology

## 2022-11-15 ENCOUNTER — Ambulatory Visit: Payer: Medicare HMO | Attending: Cardiology

## 2022-11-15 VITALS — BP 100/70 | HR 74 | Ht 70.0 in | Wt 246.8 lb

## 2022-11-15 DIAGNOSIS — I2510 Atherosclerotic heart disease of native coronary artery without angina pectoris: Secondary | ICD-10-CM | POA: Diagnosis not present

## 2022-11-15 DIAGNOSIS — E785 Hyperlipidemia, unspecified: Secondary | ICD-10-CM | POA: Diagnosis not present

## 2022-11-15 DIAGNOSIS — I5042 Chronic combined systolic (congestive) and diastolic (congestive) heart failure: Secondary | ICD-10-CM | POA: Diagnosis not present

## 2022-11-15 DIAGNOSIS — R002 Palpitations: Secondary | ICD-10-CM | POA: Diagnosis not present

## 2022-11-15 DIAGNOSIS — I10 Essential (primary) hypertension: Secondary | ICD-10-CM | POA: Diagnosis not present

## 2022-11-15 MED ORDER — ATORVASTATIN CALCIUM 20 MG PO TABS: 20.0000 mg | ORAL_TABLET | Freq: Every day | ORAL | 3 refills | Status: AC

## 2022-11-15 NOTE — Progress Notes (Unsigned)
Enrolled for [employer] to mail a ZIO XT long term holter monitor to the patients address on file.  

## 2022-11-15 NOTE — Progress Notes (Signed)
Cardiology Office Note:    Date:  11/15/2022   ID:  Craig Hurley, DOB February 18, 1946, MRN [id number]  PCP:  Dettinger, Fransisca Kaufmann, MD  Cardiologist:  Donato Heinz, MD  Electrophysiologist:  None   Referring MD: Dettinger, Fransisca Kaufmann, MD   No chief complaint on file.   History of Present Illness:    Craig Hurley is a 76 y.o. male with a hx of CVA, COPD, hyperlipidemia who presents for follow-up.  He was referred by Dr. Krista Blue for evaluation of CVA, initially seen on 03/01/2021.  He had sudden onset right-sided weakness and language difficulty on 12/25/2019, diagnosed with a left MCA stroke, treated with TPA.  MRI brain showed acute left MCA infarct.  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  Echocardiogram showed EF 40 to 45%.  His hospital course was complicated by acute hypoxic respiratory failure from COVID-19 infection, treated with Decadron and remdesivir.  Echocardiogram on 04/12/2021 showed LVEF 45 to 50%, global hypokinesis, mild LV dilatation, grade 1 diastolic dysfunction, normal RV function, moderate left atrial dilatation, mild MR.  Lexiscan Myoview on 04/12/2021 showed small fixed perfusion defect in apical inferior wall/apex consistent with prior infarct, LVEF 29%.  Cardiac MRI on 05/26/2021 showed LVEF 41%, RVEF 53%, no LGE.  Zio patch x14 days on 07/07/2021 showed 2 episodes of NSVT longest lasting 6 beats and 11 episodes of SVT longest lasting 14 seconds.  Coronary CTA on 11/03/2021 showed minimal nonobstructive CAD, calcium score 59 (62nd percentile).  Also noted to have clustered nodule densities in right middle lobe and 9 mm left lower lobe pulmonary nodule.  Since last clinic visit, he reports that he has been doing okay.  Denies any chest pain, dyspnea,  or palpitations.  Reports  some lightheadedness with standing, denies any syncope.  Has been noticing some bruising on his arms.  Did have some lower extremity edema but has improved.  Reports has been having occasional palpitations where feels like heart is beating hard for 2 to 3 minutes, occurs at rest and can happen up to 2-3 times per week but that may not happen for 1 month.   Wt Readings from Last 3 Encounters:  11/15/22 246 lb 12.8 oz (111.9 kg)  11/09/22 247 lb (112 kg)  09/26/22 249 lb (112.9 kg)   BP Readings from Last 3 Encounters:  11/15/22 100/70  11/09/22 109/74  09/26/22 104/67     Past Medical History:  Diagnosis Date   Allergy    Arthritis    Asthma    Cataract    Headache    Stroke [facility])     Past Surgical History:  Procedure Laterality Date   BACK SURGERY  2005   lumbar   EYE SURGERY     cataracts   FRACTURE SURGERY Right    SHOULDER ARTHROSCOPY Right    SKIN GRAFT FULL THICKNESS TRUNK     VASECTOMY      Current Medications: Current Meds  Medication Sig   acetaminophen (TYLENOL) 325 MG tablet Take 650 mg by mouth every 6 (six) hours as needed.   albuterol (VENTOLIN HFA) 108 (90 Base) MCG/ACT inhaler Inhale 2 puffs into the lungs every 4 (four) hours as needed for wheezing or shortness of breath.   aspirin EC 325 MG tablet Take 1 tablet (325 mg total) by mouth daily.  budesonide-formoterol (SYMBICORT) 160-4.5 MCG/ACT inhaler Inhale 2 puffs into the lungs 2 (two) times daily.   carvedilol (COREG) 3.125 MG tablet Take 1 tablet by mouth twice daily   cetirizine (ZYRTEC) 10 MG tablet Take 1 tablet (10 mg total) by mouth daily.   Cyanocobalamin (B-12 PO) Take 1,000 mcg by mouth daily.   doxazosin (CARDURA) 4 MG tablet Take 1 tablet (4 mg total) by mouth daily.   empagliflozin (JARDIANCE) 10 MG TABS tablet Take 1 tablet (10 mg total) by mouth daily before breakfast.   latanoprost (XALATAN) 0.005 % ophthalmic solution Place 1 drop into the left eye at bedtime.   meloxicam (MOBIC)  15 MG tablet Take 1 tablet (15 mg total) by mouth daily.   montelukast (SINGULAIR) 10 MG tablet TAKE 1 TABLET AT BEDTIME   Multiple Vitamins-Minerals (PRESERVISION AREDS 2 PO) Take 1 capsule by mouth 2 (two) times daily.   omeprazole (PRILOSEC) 20 MG capsule TAKE 1 CAPSULE TWICE DAILY BEFORE MEALS   sacubitril-valsartan (ENTRESTO) 24-26 MG Take 1 tablet by mouth 2 (two) times daily.   spironolactone (ALDACTONE) 25 MG tablet Take 1/2 (one-half) tablet by mouth once daily   Tiotropium Bromide Monohydrate (SPIRIVA RESPIMAT) 2.5 MCG/ACT AERS Inhale 2 puffs into the lungs daily.   vitamin A 8000 UNIT capsule Take 8,000 Units by mouth daily.   [DISCONTINUED] atorvastatin (LIPITOR) 10 MG tablet Take 1 tablet (10 mg total) by mouth daily.     Allergies:   Penicillins   Social History   Socioeconomic History   Marital status: Married    Spouse name: Blanch Media   Number of children: 2   Years of education: 12   Highest education level: GED or equivalent  Occupational History   Occupation: Retired  Tobacco Use   Smoking status: Former    Types: Cigars   Smokeless tobacco: Current    Types: Snuff   Tobacco comments:    Pt states he has been quitting with smoking but is unsure when he quit.   Vaping Use   Vaping Use: Never used  Substance and Sexual Activity   Alcohol use: No   Drug use: No   Sexual activity: Yes  Other Topics Concern   Not on file  Social History Narrative   Lives at home with his wife.   Right-handed.   12 cups coffee day.   [place] in [place]   Social Determinants of Health   Financial Resource Strain: Low Risk  (08/01/2022)   Overall Financial Resource Strain (CARDIA)    Difficulty of Paying Living Expenses: Not hard at all  Food Insecurity: No Food Insecurity (08/01/2022)   Hunger Vital Sign    Worried About Running Out of Food in the Last Year: Never true    Ran Out of Food in the Last Year: Never true  Transportation Needs: No Transportation Needs (08/01/2022)    PRAPARE - Hydrologist (Medical): No    Lack of Transportation (Non-Medical): No  Physical Activity: Insufficiently Active (08/01/2022)   Exercise Vital Sign    Days of Exercise per Week: 7 days    Minutes of Exercise per Session: 10 min  Stress: No Stress Concern Present (08/01/2022)   [place]    Feeling of Stress : Not at all  Social Connections: Moderately Isolated (08/01/2022)   Social Connection and Isolation Panel [NHANES]    Frequency of Communication with Friends and Family: More than three times a week  Frequency of Social Gatherings with Friends and Family: More than three times a week    Attends Religious Services: Never    Marine scientist or Organizations: No    Attends Archivist Meetings: Never    Marital Status: Married     Family History: The patient's family history includes Cirrhosis in his father; Colon cancer in his mother. He was adopted.  ROS:   Please see the history of present illness.      All other systems reviewed and are negative.  EKGs/Labs/Other Studies Reviewed:    The following studies were reviewed today: [place] 03/2021: Study Highlights   The left ventricular ejection fraction is severely decreased (<30%). Nuclear stress EF: 29%. Defect 1: There is a small defect of mild severity present in the apical inferior and apex location. Findings consistent with prior myocardial infarction. This is a high risk study.   1. Small fixed perfusion defect in apical inferior wall/apex consistent with infarct 2. Severe LV systolic dysfunction (EF 67%) 3. High risk study given severe LV dysfunction.  No ischemia  Echo 03/2021:   IMPRESSIONS    1. Left ventricular ejection fraction, by estimation, is 45 to 50%. The  left ventricle has mildly decreased function. The left ventricle  demonstrates global hypokinesis. The left ventricular internal  cavity size  was mildly dilated. Left ventricular  diastolic parameters are consistent with Grade I diastolic dysfunction  (impaired relaxation). Elevated left atrial pressure.   2. Right ventricular systolic function is normal. The right ventricular  size is normal.   3. Left atrial size was moderately dilated.   4. The mitral valve is normal in structure. Mild mitral valve  regurgitation. No evidence of mitral stenosis.   5. The aortic valve is tricuspid. Aortic valve regurgitation is trivial.  No aortic stenosis is present.   EKG:  08/02/21: Normal sinus rhythm, rate 65 left bundle branch block 10/24/21:NSR, rate 59, LBBB 04/27/2021- The EKG ordered demonstrates Normal sinus rhythm, rate 61,LBBB 03/01/2021- The ekg ordered demonstrates normal sinus rhythm, rate 70, left bundle branch block  Recent Labs: 11/09/2022: ALT 10; BUN 19; Creatinine, Ser 0.92; Hemoglobin 15.3; Platelets 207; Potassium 4.4; Sodium 140  Recent Lipid Panel    Component Value Date/Time   CHOL 155 11/09/2022 1017   TRIG 103 11/09/2022 1017   HDL 53 11/09/2022 1017   CHOLHDL 2.9 11/09/2022 1017   LDLCALC 83 11/09/2022 1017    Physical Exam:    VS:  BP 100/70   Pulse 74   Ht '5\' 10"'$  (1.778 m)   Wt 246 lb 12.8 oz (111.9 kg)   SpO2 95%   BMI 35.41 kg/m     Wt Readings from Last 3 Encounters:  11/15/22 246 lb 12.8 oz (111.9 kg)  11/09/22 247 lb (112 kg)  09/26/22 249 lb (112.9 kg)     GEN: Well nourished, well developed in no acute distress HEENT: Normal NECK: No JVD; No carotid bruits CARDIAC: RRR, no murmurs, rubs, gallops RESPIRATORY:  Clear to auscultation without rales, wheezing or rhonchi  ABDOMEN: Soft, non-tender, non-distended MUSCULOSKELETAL:  Trace edema; No deformity  SKIN: Warm and dry NEUROLOGIC:  Alert and oriented x 3 PSYCHIATRIC:  Normal affect   ASSESSMENT:    1. Chronic combined systolic and diastolic heart failure (HCC)   2. Palpitations   3. Coronary artery disease  involving native coronary artery of native heart without angina pectoris   4. Essential hypertension   5. Hyperlipidemia, unspecified hyperlipidemia type  PLAN:    Chest pain/dyspnea on exertion: Chest pain is atypical description but does have CAD risk factors (hypertension, hyperlipidemia, age, former tobacco use).  Echocardiogram on 04/12/2021 showed LVEF 45 to 50%, global hypokinesis, mild LV dilatation, grade 1 diastolic dysfunction, normal RV function, moderate left atrial dilatation, mild MR.  Lexiscan Myoview on 04/12/2021 showed small fixed perfusion defect in apical inferior wall/apex consistent with prior infarct, LVEF 29%.  Given abnormal Myoview and he continued to have chest pain, coronary CTA was done.  Coronary CTA on 11/03/2021 showed minimal nonobstructive CAD, calcium score 59 (62nd percentile).   -Continue aspirin, statin  Chronic combined systolic and diastolic heart failure: EF 40 to 45% after CVA in January 2021.  Echocardiogram on 04/12/2021 showed LVEF 45 to 50%.  Lexiscan Myoview on 04/12/2021 showed fixed perfusion defect in apical inferior wall in the apex, no ischemia.  Cardiac MRI on 05/26/2021 showed LVEF 41%, RVEF 53%, no LGE. -Continue Entresto 24-26 mg twice daily.   -Continue carvedilol 3.125 mg twice daily.   -Continue Jardiance 10 mg daily -Continue spironolactone 12.5 mg daily -Labs from 12/14 showed stable renal function and electrolytes -Repeat echocardiogram to evaluate for improvement in systolic function  Palpitations: Description concerning for arrhythmia, will evaluate with Zio patch x 2 weeks  CVA: Left MCA stroke on 12/25/2019.  Received IV TPA.  On aspirin, statin.  Follows with neurology.  Zio patch x14 days on 07/07/2021 showed 2 episodes of NSVT longest lasting 6 beats and 11 episodes of SVT longest lasting 14 seconds, no Afib. -Continue aspirin, statin  Hyperlipidemia: On atorvastatin 10 mg daily.  LDL 83 on 11/09/2022.  Increase atorvastatin  to 20 mg daily for goal LDL less than 70  Hypertension: On doxazosin 4 mg daily, which he takes for BPH,as well as entresto and coreg and spironolactone for heart failure as above.  Appears controlled  Lung nodule: Coronary CTA on 11/03/2021 showed clustered nodule densities in right middle lobe and 9 mm left lower lobe pulmonary nodule.  Repeat chest CT 02/20/2022 showed stable nodules, but with peripheral reticulations suggestive of pulmonary fibrosis.  Referred to pulmonology.  OSA: follows with pulmonology, sleep study 05/2022 showed severe OSA, has been unable to tolerate CPAP   RTC in 6 months    Medication Adjustments/Labs and Tests Ordered: Current medicines are reviewed at length with the patient today.  Concerns regarding medicines are outlined above.  Orders Placed This Encounter  Procedures   LONG TERM MONITOR (3-14 DAYS)   ECHOCARDIOGRAM COMPLETE     Meds ordered this encounter  Medications   atorvastatin (LIPITOR) 20 MG tablet    Sig: Take 1 tablet (20 mg total) by mouth daily.    Dispense:  90 tablet    Refill:  3    Dose increase      Patient Instructions  Medication Instructions:  INCREASE atorvastatin (Lipitor) to 20 mg daily  *If you need a refill on your cardiac medications before your next appointment, please call your pharmacy*  Testing/Procedures: Your physician has requested that you have an echocardiogram. Echocardiography is a painless test that uses sound waves to create images of your heart. It provides your doctor with information about the size and shape of your heart and how well your heart's chambers and valves are working. This procedure takes approximately one hour. There are no restrictions for this procedure. Please do NOT wear cologne, perfume, aftershave, or lotions (deodorant is allowed). Please arrive 15 minutes prior to your appointment time.  ZIO XT- Long Term Monitor Instructions   Your physician has requested you wear a ZIO patch  monitor for _14_ days.  This is a single patch monitor.   [employer] supplies one patch monitor per enrollment. Additional stickers are not available. Please do not apply patch if you will be having a Nuclear Stress Test, Echocardiogram, Cardiac CT, MRI, or Chest Xray during the period you would be wearing the monitor. The patch cannot be worn during these tests. You cannot remove and re-apply the ZIO XT patch monitor.  Your ZIO patch monitor will be sent Fed Ex from [employer] directly to your home address. It may take 3-5 days to receive your monitor after you have been enrolled.  Once you have received your monitor, please review the enclosed instructions. Your monitor has already been registered assigning a specific monitor serial # to you.  Billing and Patient Assistance Program Information   We have supplied [employer] with any of your insurance information on file for billing purposes. [employer] offers a sliding scale Patient Assistance Program for patients that do not have insurance, or whose insurance does not completely cover the cost of the ZIO monitor.   You must apply for the Patient Assistance Program to qualify for this discounted rate.     To apply, please call [employer] at [phone], select option 4, then select option 2, and ask to apply for Patient Assistance Program.  Theodore Demark will ask your household income, and how many people are in your household.  They will quote your out-of-pocket cost based on that information.  [employer] will also be able to set up a 9-month interest-free payment plan if needed.  Applying the monitor   Shave hair from upper left chest.  Hold abrader disc by orange tab. Rub abrader in 40 strokes over the upper left chest as indicated in your monitor instructions.  Clean area with 4 enclosed alcohol pads. Let dry.  Apply patch as indicated in monitor instructions. Patch will be placed under collarbone on left side of chest with arrow pointing upward.  Rub  patch adhesive wings for 2 minutes. Remove white label marked "1". Remove the white label marked "2". Rub patch adhesive wings for 2 additional minutes.  While looking in a mirror, press and release button in center of patch. A small green light will flash 3-4 times. This will be your only indicator that the monitor has been turned on. ?  Do not shower for the first 24 hours. You may shower after the first 24 hours.  Press the button if you feel a symptom. You will hear a small click. Record Date, Time and Symptom in the Patient Logbook.  When you are ready to remove the patch, follow instructions on the last 2 pages of the Patient Logbook. Stick patch monitor onto the last page of Patient Logbook.  Place Patient Logbook in the blue and white box.  Use locking tab on box and tape box closed securely.  The blue and white box has prepaid postage on it. Please place it in the mailbox as soon as possible. Your physician should have your test results approximately 7 days after the monitor has been [facility]  Call [place] [phone] you have questions regarding your ZIO XT patch monitor. Call them immediately if you see an orange light blinking on your monitor.  If your monitor falls off in less than 4 days, contact our Monitor department at [phone] ?If your monitor  becomes loose or falls off after 4 days call [employer] at [phone] for suggestions on securing your monitor.?  Follow-Up: At [facility], you and your health needs are our priority.  As part of our continuing mission to provide you with exceptional heart care, we have created designated Provider Care Teams.  These Care Teams include your primary Cardiologist (physician) and Advanced Practice Providers (APPs -  Physician Assistants and Nurse Practitioners) who all work together to provide you with the care you need, when you need it.  We recommend signing up for the patient  portal called "MyChart".  Sign up information is provided on this After Visit Summary.  MyChart is used to connect with patients for Virtual Visits (Telemedicine).  Patients are able to view lab/test results, encounter notes, upcoming appointments, etc.  Non-urgent messages can be sent to your provider as well.   To learn more about what you can do with MyChart, go to [email or web address].    Your next appointment:   6 month(s)  The format for your next appointment:   In Person  Provider:   Donato Heinz, MD             Signed, Donato Heinz, MD  11/15/2022 11:53 AM    [place]

## 2022-11-15 NOTE — Patient Instructions (Signed)
Medication Instructions:  INCREASE atorvastatin (Lipitor) to 20 mg daily  *If you need a refill on your cardiac medications before your next appointment, please call your pharmacy*  Testing/Procedures: Your physician has requested that you have an echocardiogram. Echocardiography is a painless test that uses sound waves to create images of your heart. It provides your doctor with information about the size and shape of your heart and how well your heart's chambers and valves are working. This procedure takes approximately one hour. There are no restrictions for this procedure. Please do NOT wear cologne, perfume, aftershave, or lotions (deodorant is allowed). Please arrive 15 minutes prior to your appointment time.  ZIO XT- Long Term Monitor Instructions   Your physician has requested you wear a ZIO patch monitor for _14_ days.  This is a single patch monitor.   [employer] supplies one patch monitor per enrollment. Additional stickers are not available. Please do not apply patch if you will be having a Nuclear Stress Test, Echocardiogram, Cardiac CT, MRI, or Chest Xray during the period you would be wearing the monitor. The patch cannot be worn during these tests. You cannot remove and re-apply the ZIO XT patch monitor.  Your ZIO patch monitor will be sent Fed Ex from [employer] directly to your home address. It may take 3-5 days to receive your monitor after you have been enrolled.  Once you have received your monitor, please review the enclosed instructions. Your monitor has already been registered assigning a specific monitor serial # to you.  Billing and Patient Assistance Program Information   We have supplied [employer] with any of your insurance information on file for billing purposes. [employer] offers a sliding scale Patient Assistance Program for patients that do not have insurance, or whose insurance does not completely cover the cost of the ZIO monitor.   You must apply for the  Patient Assistance Program to qualify for this discounted rate.     To apply, please call [employer] at [phone], select option 4, then select option 2, and ask to apply for Patient Assistance Program.  Theodore Demark will ask your household income, and how many people are in your household.  They will quote your out-of-pocket cost based on that information.  [employer] will also be able to set up a 58-month interest-free payment plan if needed.  Applying the monitor   Shave hair from upper left chest.  Hold abrader disc by orange tab. Rub abrader in 40 strokes over the upper left chest as indicated in your monitor instructions.  Clean area with 4 enclosed alcohol pads. Let dry.  Apply patch as indicated in monitor instructions. Patch will be placed under collarbone on left side of chest with arrow pointing upward.  Rub patch adhesive wings for 2 minutes. Remove white label marked "1". Remove the white label marked "2". Rub patch adhesive wings for 2 additional minutes.  While looking in a mirror, press and release button in center of patch. A small green light will flash 3-4 times. This will be your only indicator that the monitor has been turned on. ?  Do not shower for the first 24 hours. You may shower after the first 24 hours.  Press the button if you feel a symptom. You will hear a small click. Record Date, Time and Symptom in the Patient Logbook.  When you are ready to remove the patch, follow instructions on the last 2 pages of the Patient Logbook. Stick patch monitor onto the last page of Patient Logbook.  Place Patient Logbook in the blue and white box.  Use locking tab on box and tape box closed securely.  The blue and white box has prepaid postage on it. Please place it in the mailbox as soon as possible. Your physician should have your test results approximately 7 days after the monitor has been mailed back to [facility].  Call [place] at [phone] if you have  questions regarding your ZIO XT patch monitor. Call them immediately if you see an orange light blinking on your monitor.  If your monitor falls off in less than 4 days, contact our Monitor department at [phone]. ?If your monitor becomes loose or falls off after 4 days call [employer] at [phone] for suggestions on securing your monitor.?  Follow-Up: At [facility], you and your health needs are our priority.  As part of our continuing mission to provide you with exceptional heart care, we have created designated Provider Care Teams.  These Care Teams include your primary Cardiologist (physician) and Advanced Practice Providers (APPs -  Physician Assistants and Nurse Practitioners) who all work together to provide you with the care you need, when you need it.  We recommend signing up for the patient portal called "MyChart".  Sign up information is provided on this After Visit Summary.  MyChart is used to connect with patients for Virtual Visits (Telemedicine).  Patients are able to view lab/test results, encounter notes, upcoming appointments, etc.  Non-urgent messages can be sent to your provider as well.   To learn more about what you can do with MyChart, go to [email or web address].    Your next appointment:   6 month(s)  The format for your next appointment:   In Person  Provider:   Donato Heinz, MD

## 2022-11-19 DIAGNOSIS — R002 Palpitations: Secondary | ICD-10-CM

## 2022-11-23 ENCOUNTER — Telehealth: Payer: Self-pay | Admitting: *Deleted

## 2022-11-23 ENCOUNTER — Ambulatory Visit: Payer: Medicare HMO | Admitting: *Deleted

## 2022-11-23 DIAGNOSIS — J441 Chronic obstructive pulmonary disease with (acute) exacerbation: Secondary | ICD-10-CM

## 2022-11-23 DIAGNOSIS — I5042 Chronic combined systolic (congestive) and diastolic (congestive) heart failure: Secondary | ICD-10-CM

## 2022-11-23 NOTE — Patient Instructions (Signed)
Please call the care guide team at [phone] if you need to cancel or reschedule your appointment.   If you are experiencing a Mental Health or [place] or need someone to talk to, please call the Suicide and Crisis Lifeline: 988 call the [place] National Suicide Prevention Lifeline: 705-170-0660 or TTY: 228-327-8028 TTY 3801432868) to talk to a trained counselor call 1-800-273-TALK (toll free, 24 hour hotline) go to [facility] [address], [place] [phone]) call the [facility]: [phone] call 911   Following is a copy of the CCM Program Consent:  CCM service includes personalized support from designated clinical staff supervised by the physician, including individualized plan of care and coordination with other care providers 24/7 contact phone numbers for assistance for urgent and routine care needs. Service will only be billed when office clinical staff spend 20 minutes or more in a month to coordinate care. Only one practitioner may furnish and bill the service in a calendar month. The patient may stop CCM services at amy time (effective at the end of the month) by phone call to the office staff. The patient will be responsible for cost sharing (co-pay) or up to 20% of the service fee (after annual deductible is met)  Following is a copy of your full provider care plan:   Goals Addressed             This Visit's Progress    CCM ([place]) EXPECTED OUTCOME: MONITOR, SELF-MANAGE AND REDUCE SYMPTOMS OF CONGESTIVE HEART FAILURE       Current Barriers:  Knowledge Deficits related to Congestive Heart Failure management Chronic Disease Management support and education needs related to Congestive Heart Failure Patient and spouse report pt does have a scale but does not weigh, states this is something most likely won't do.    Planned Interventions: Basic overview and discussion of  pathophysiology of Heart Failure reviewed Provided education on low sodium diet Reviewed Heart Failure Action Plan in depth and provided written copy Assessed need for readable accurate scales in home Provided education about placing scale on hard, flat surface Advised patient to weigh each morning after emptying bladder Discussed importance of daily weight and advised patient to weigh and record daily Discussed the importance of keeping all appointments with provider Advised patient to discuss any issues with managing CHF, side effects of medication  with provider Screening for signs and symptoms of depression related to chronic disease state  Assessed social determinant of health barriers Reviewed importance of following low sodium diet  Symptom Management: Take medications as prescribed   Attend all scheduled provider appointments Call pharmacy for medication refills 3-7 days in advance of running out of medications Attend church or other social activities Perform all self care activities independently  Perform IADL's (shopping, preparing meals, housekeeping, managing finances) independently Call provider office for new concerns or questions  call office if I gain more than 2 pounds in one day or 5 pounds in one week keep legs up while sitting use salt in moderation watch for swelling in feet, ankles and legs every day weigh myself daily develop a rescue plan follow rescue plan if symptoms flare-up eat more whole grains, fruits and vegetables, lean meats and healthy fats know when to call the doctor- weight gain 3 pounds overnight or 5 pounds in one week, swelling in feet, increased shortness of breath dress right for the weather, hot or cold Go to ED for unrelieved shortness of breath  or chest pain Follow low sodium diet- read food labels for sodium content Look over education sent via my chart- Heart Failure action plan  Follow Up Plan: Telephone follow up appointment with  care management team member scheduled for:  01/08/23 at 58 am       CCM (COPD) EXPECTED OUTCOME: MONITOR, SELF-MANAGE AND REDUCE SYMPTOMS OF COPD       Current Barriers:  Knowledge Deficits related to COPD management Chronic Disease Management support and education needs related to COPD, oxygen use and safety Spoke with patient and spouse together, pt reports he is supposed to use oxygen at 2 liters hs only, states he has not used in the last 2-3 months citing he just does not want to use it. Pt and spouse reports pt is using all inhalers as prescribed.  Planned Interventions: Provided patient with basic written and verbal COPD education on self care/management/and exacerbation prevention Provided instruction about proper use of medications used for management of COPD including inhalers Advised patient to self assesses COPD action plan zone and make appointment with provider if in the yellow zone for 48 hours without improvement Advised patient to engage in light exercise as tolerated 3-5 days a week to aid in the the management of COPD Provided education about and advised patient to utilize infection prevention strategies to reduce risk of respiratory infection Discussed the importance of adequate rest and management of fatigue with COPD Screening for signs and symptoms of depression related to chronic disease state  Assessed social determinant of health barriers Encouraged pt to use oxygen as prescribed  Symptom Management: Take medications as prescribed   Attend all scheduled provider appointments Call pharmacy for medication refills 3-7 days in advance of running out of medications Attend church or other social activities Perform all self care activities independently  Perform IADL's (shopping, preparing meals, housekeeping, managing finances) independently Call provider office for new concerns or questions  identify and remove indoor air pollutants limit outdoor activity during  cold weather listen for public air quality announcements every day do breathing exercises every day develop a rescue plan eliminate symptom triggers at home follow rescue plan if symptoms flare-up get at least 7 to 8 hours of sleep at night practice relaxation or meditation daily Look over education sent via my chart- COPD action plan Please use oxygen as prescribed  Follow Up Plan: Telephone follow up appointment with care management team member scheduled for:  01/08/23 at 1045 am          Patient verbalizes understanding of instructions and care plan provided today and agrees to view in [place]. Active MyChart status and patient understanding of how to access instructions and care plan via MyChart confirmed with patient.     Telephone follow up appointment with care management team member scheduled for:  01/08/23 at 87 am  COPD Action Plan A COPD action plan is a description of what to do when you have a flare (exacerbation) of chronic obstructive pulmonary disease (COPD). Your action plan is a color-coded plan that lists the symptoms that indicate whether your condition is under control and what actions to take. If you have symptoms in the green zone, it means you are doing well that day. If you have symptoms in the yellow zone, it means you are having a bad day or an exacerbation. If you have symptoms in the red zone, you need urgent medical care. Follow the plan that you and your health care provider developed. Review your plan with  your health care provider at each visit. Red zone Symptoms in this zone mean that you should get medical help right away. They include: Feeling very short of breath, even when you are resting. Not being able to do any activities because of poor breathing. Not being able to sleep because of poor breathing. Fever or shaking chills. Feeling confused or very sleepy. Chest pain. Coughing up blood. If you have any of these symptoms, call emergency  services (911 in the [place] or go to the nearest emergency room. Yellow zone Symptoms in this zone mean that your condition may be getting worse. They include: Feeling more short of breath than usual. Having less energy for daily activities than usual. Phlegm or mucus that is thicker than usual. Needing to use your rescue inhaler or nebulizer more often than usual. More ankle swelling than usual. Coughing more than usual. Feeling like you have a chest cold. Trouble sleeping due to COPD symptoms. Decreased appetite. COPD medicines not helping as much as usual. If you experience any "yellow" symptoms: Keep taking your daily medicines as directed. Use your quick-relief inhaler as told by your health care provider. If you were prescribed steroid medicine to take by mouth (oral medicine), start taking it as told by your health care provider. If you were prescribed an antibiotic medicine, start taking it as told by your health care provider. Do not stop taking the antibiotic even if you start to feel better. Use oxygen as told by your health care provider. Get more rest. Do your pursed-lip breathing exercises. Do not smoke. Avoid any irritants in the air. If your signs and symptoms do not improve after taking these steps, call your health care provider right away. Green zone Symptoms in this zone mean that you are doing well. They include: Being able to do your usual activities and exercise. Having the usual amount of coughing, including the same amount of phlegm or mucus. Being able to sleep well. Having a good appetite. Where to find more information: You can find more information about COPD from: American Lung Association, My COPD Action Plan: www.lung.org COPD Foundation: www.copdfoundation.[place]: https://wilson-eaton.com/ Follow these instructions at home: Continue taking your daily medicines as told by your health care provider. Make sure you receive  all the immunizations that your health care provider recommends, especially the pneumococcal and influenza vaccines. Wash your hands often with soap and water. Have family members wash their hands too. Regular hand washing can help prevent infections. Follow your usual exercise and diet plan. Avoid irritants in the air, such as smoke. Do not use any products that contain nicotine or tobacco. These products include cigarettes, chewing tobacco, and vaping devices, such as e-cigarettes. If you need help quitting, ask your health care provider. Summary A COPD action plan tells you what to do when you have a flare (exacerbation) of chronic obstructive pulmonary disease (COPD). Follow each action plan for your symptoms. If you have any symptoms in the red zone, call emergency services (911 in the [place] or go to the nearest emergency room. This information is not intended to replace advice given to you by your health care provider. Make sure you discuss any questions you have with your health care provider. Document Revised: 09/21/2020 Document Reviewed: 09/21/2020 Elsevier Patient Education  [place]. Heart Failure Action Plan A heart failure action plan helps you understand what to do when you have symptoms of heart failure. Your action plan is a color-coded plan  that lists the symptoms to watch for and indicates what actions to take. If you have symptoms in the red zone, you need medical care right away. If you have symptoms in the yellow zone, you are having problems. If you have symptoms in the green zone, you are doing well. Follow the plan that was created by you and your health care provider. Review your plan each time you visit your health care provider. Red zone These signs and symptoms mean you should get medical help right away: You have trouble breathing when resting. You have a dry cough that is getting worse. You have swelling or pain in your legs or abdomen that is getting  worse. You suddenly gain more than 2-3 lb (0.9-1.4 kg) in 24 hours, or more than 5 lb (2.3 kg) in a week. This amount may be more or less depending on your condition. You have trouble staying awake or you feel confused. You have chest pain. You do not have an appetite. You pass out. You have worsening sadness or depression. If you have any of these symptoms, call your local emergency services (911 in the [place] right away. Do not drive yourself to the hospital. Yellow zone These signs and symptoms mean your condition may be getting worse and you should make some changes: You have trouble breathing when you are active, or you need to sleep with your head raised on extra pillows to help you breathe. You have swelling in your legs or abdomen. You gain 2-3 lb (0.9-1.4 kg) in 24 hours, or 5 lb (2.3 kg) in a week. This amount may be more or less depending on your condition. You get tired easily. You have trouble sleeping. You have a dry cough. If you have any of these symptoms: Contact your health care provider within the next day. Your health care provider may adjust your medicines. Green zone These signs mean you are doing well and can continue what you are doing: You do not have shortness of breath. You have very little swelling or no new swelling. Your weight is stable (no gain or loss). You have a normal activity level. You do not have chest pain or any other new symptoms. Follow these instructions at home: Take over-the-counter and prescription medicines only as told by your health care provider. Weigh yourself daily. Your target weight is __________ lb (__________ kg). Call your health care provider if you gain more than __________ lb (__________ kg) in 24 hours, or more than __________ lb (__________ kg) in a week. Health care provider name: _____________________________________________________ Health care provider phone number:  _____________________________________________________ Eat a heart-healthy diet. Work with a diet and nutrition specialist (dietitian) to create an eating plan that is best for you. Keep all follow-up visits. This is important. Where to find more information American Heart Association: Summary A heart failure action plan helps you understand what to do when you have symptoms of heart failure. Follow the action plan that was created by you and your health care provider. Get help right away if you have any symptoms in the red zone. This information is not intended to replace advice given to you by your health care provider. Make sure you discuss any questions you have with your health care provider. Document Revised: 02/21/2022 Document Reviewed: 06/28/2020 Elsevier Patient Education  [place].

## 2022-11-23 NOTE — Telephone Encounter (Signed)
   CCM RN Visit Note   11/23/22 Name: Lucky Trotta MRN: [id number]      DOB: [birth year]  Subjective: Bejamin Hackbart is a 76 y.o. year old male who is a primary care patient of Caryl Pina MD The patient was referred to the Chronic Care Management team for assistance with care management needs subsequent to provider initiation of CCM services and plan of care.      An unsuccessful telephone outreach was attempted today to contact the patient about Chronic Care Management needs.    Plan:Telephone follow up appointment with care management team member scheduled for:  will attempt again this week.  Jacqlyn Larsen RNC, BSN RN Case Manager [place] [phone]

## 2022-11-24 DIAGNOSIS — H40052 Ocular hypertension, left eye: Secondary | ICD-10-CM | POA: Diagnosis not present

## 2022-11-24 DIAGNOSIS — H353132 Nonexudative age-related macular degeneration, bilateral, intermediate dry stage: Secondary | ICD-10-CM | POA: Diagnosis not present

## 2022-11-29 ENCOUNTER — Other Ambulatory Visit: Payer: Self-pay | Admitting: Family Medicine

## 2022-11-29 DIAGNOSIS — I5042 Chronic combined systolic (congestive) and diastolic (congestive) heart failure: Secondary | ICD-10-CM

## 2022-11-29 DIAGNOSIS — J438 Other emphysema: Secondary | ICD-10-CM

## 2022-11-29 NOTE — Plan of Care (Signed)
Chronic Care Management Provider Comprehensive Care Plan    11/29/2022 Name: Craig Hurley MRN: [id number] DOB: October 15, 1946  Referral to Chronic Care Management (CCM) services was placed by Provider:  Caryl Pina MD on Date: 10/17/22 and 11/29/22.  Chronic Condition 1: COPD Provider Assessment and Plan Patient parents with COPD excerebration.with congested cough. He denies fever and flu like symptoms.  Take meds as prescribed - Use a cool mist humidifier  -Use saline nose sprays frequently -Force fluids -For fever or aches or pains- take Tylenol or ibuprofen. -If symptoms do not improve, she may need to be COVID tested to rule this out Follow up with worsening unresolved symptoms    Expected Outcome/Goals Addressed This Visit (Provider CCM goals/Provider Assessment and plan  CCM (COPD) EXPECTED OUTCOME: MONITOR, SELF-MANAGE AND REDUCE SYMPTOMS OF COPD  Symptom Management Condition 1: Take medications as prescribed   Attend all scheduled provider appointments Call pharmacy for medication refills 3-7 days in advance of running out of medications Attend church or other social activities Perform all self care activities independently  Perform IADL's (shopping, preparing meals, housekeeping, managing finances) independently Call provider office for new concerns or questions  identify and remove indoor air pollutants limit outdoor activity during cold weather listen for public air quality announcements every day do breathing exercises every day develop a rescue plan eliminate symptom triggers at home follow rescue plan if symptoms flare-up get at least 7 to 8 hours of sleep at night practice relaxation or meditation daily Look over education sent via my chart- COPD action plan Please use oxygen as prescribed  Chronic Condition 2: HEART FAILURE Provider Assessment and Plan  CHF Patient is currently on carvedilol and Entresto and spironolactone and doxazosin, and their blood  pressure today is 109/74. Patient denies any lightheadedness or dizziness. Patient denies headaches, blurred vision, chest pains, shortness of breath, or weakness. Denies any side effects from medication and is content with current medication.   Expected Outcome/Goals Addressed This Visit (Provider CCM goals/Provider Assessment and plan  CCM (CONGESTIVE HEART FAILURE) EXPECTED OUTCOME: MONITOR, SELF-MANAGE AND REDUCE SYMPTOMS OF CONGESTIVE HEART FAILURE  Symptom Management Condition 2: Take medications as prescribed   Attend all scheduled provider appointments Call pharmacy for medication refills 3-7 days in advance of running out of medications Attend church or other social activities Perform all self care activities independently  Perform IADL's (shopping, preparing meals, housekeeping, managing finances) independently Call provider office for new concerns or questions  call office if I gain more than 2 pounds in one day or 5 pounds in one week keep legs up while sitting use salt in moderation watch for swelling in feet, ankles and legs every day weigh myself daily develop a rescue plan follow rescue plan if symptoms flare-up eat more whole grains, fruits and vegetables, lean meats and healthy fats know when to call the doctor- weight gain 3 pounds overnight or 5 pounds in one week, swelling in feet, increased shortness of breath dress right for the weather, hot or cold Go to ED for unrelieved shortness of breath or chest pain Follow low sodium diet- read food labels for sodium content Look over education sent via my chart- Heart Failure action plan  Problem List Patient Active Problem List   Diagnosis Date Noted   Sleep apnea 08/09/2022   Carpal tunnel syndrome of left wrist 07/24/2022   Chronic respiratory failure with hypoxia ([place]) 05/09/2022   Dyspnea on exertion 03/10/2022   Chronic combined systolic and diastolic heart failure ([place])  07/28/2021   Palpitation 02/19/2021    History of CVA (cerebrovascular accident) 12/09/2020   Aphasia 12/09/2020   Memory loss 12/09/2020   Stenosis of left carotid artery 12/09/2020   Multiple nodules of lung 01/03/2019   Prediabetes 05/01/2018   Obesity 10/25/2016   COPD (chronic obstructive pulmonary disease) with emphysema ([place]) 11/08/2015   BPH (benign prostatic hyperplasia) 10/25/2015   GERD (gastroesophageal reflux disease) 10/25/2015    Medication Management  Current Outpatient Medications:    acetaminophen (TYLENOL) 325 MG tablet, Take 650 mg by mouth every 6 (six) hours as needed., Disp: , Rfl:    albuterol (VENTOLIN HFA) 108 (90 Base) MCG/ACT inhaler, Inhale 2 puffs into the lungs every 4 (four) hours as needed for wheezing or shortness of breath., Disp: 18 g, Rfl: 4   aspirin EC 325 MG tablet, Take 1 tablet (325 mg total) by mouth daily., Disp: 90 tablet, Rfl: 4   atorvastatin (LIPITOR) 20 MG tablet, Take 1 tablet (20 mg total) by mouth daily., Disp: 90 tablet, Rfl: 3   budesonide-formoterol (SYMBICORT) 160-4.5 MCG/ACT inhaler, Inhale 2 puffs into the lungs 2 (two) times daily., Disp: 3 each, Rfl: 3   carvedilol (COREG) 3.125 MG tablet, Take 1 tablet by mouth twice daily, Disp: 180 tablet, Rfl: 2   cetirizine (ZYRTEC) 10 MG tablet, Take 1 tablet (10 mg total) by mouth daily., Disp: 90 tablet, Rfl: 5   Cyanocobalamin (B-12 PO), Take 1,000 mcg by mouth daily., Disp: , Rfl:    doxazosin (CARDURA) 4 MG tablet, Take 1 tablet (4 mg total) by mouth daily., Disp: 90 tablet, Rfl: 3   empagliflozin (JARDIANCE) 10 MG TABS tablet, Take 1 tablet (10 mg total) by mouth daily before breakfast., Disp: 90 tablet, Rfl: 3   latanoprost (XALATAN) 0.005 % ophthalmic solution, Place 1 drop into the left eye at bedtime., Disp: , Rfl:    meloxicam (MOBIC) 15 MG tablet, Take 1 tablet (15 mg total) by mouth daily., Disp: 90 tablet, Rfl: 3   montelukast (SINGULAIR) 10 MG tablet, TAKE 1 TABLET AT BEDTIME, Disp: 90 tablet, Rfl: 1   Multiple  Vitamins-Minerals (PRESERVISION AREDS 2 PO), Take 1 capsule by mouth 2 (two) times daily., Disp: , Rfl:    omeprazole (PRILOSEC) 20 MG capsule, TAKE 1 CAPSULE TWICE DAILY BEFORE MEALS, Disp: 180 capsule, Rfl: 3   sacubitril-valsartan (ENTRESTO) 24-26 MG, Take 1 tablet by mouth 2 (two) times daily., Disp: 60 tablet, Rfl: 3   spironolactone (ALDACTONE) 25 MG tablet, Take 1/2 (one-half) tablet by mouth once daily, Disp: 45 tablet, Rfl: 3   Tiotropium Bromide Monohydrate (SPIRIVA RESPIMAT) 2.5 MCG/ACT AERS, Inhale 2 puffs into the lungs daily., Disp: 180 each, Rfl: 3   vitamin A 8000 UNIT capsule, Take 8,000 Units by mouth daily., Disp: , Rfl:   Cognitive Assessment Identity Confirmed: : Name Cognitive Status: Normal   Functional Assessment Hearing Difficulty or Deaf: yes Hearing Management: bil hearing aides Wear Glasses or Blind: yes Vision Management: can see  well w/ glasses Concentrating, Remembering or Making Decisions Difficulty (CP): no Difficulty Communicating: no Difficulty Eating/Swallowing: no Walking or Climbing Stairs Difficulty: no Dressing/Bathing Difficulty: no Doing Errands Independently Difficulty (such as shopping) (CP): no   Caregiver Assessment  Primary Source of Support/Comfort: spouse Name of Support/Comfort Primary Source: Craig Hurley People in Home: spouse Name(s) of People in Home: Craig Hurley   Planned Interventions  Provided patient with basic written and verbal COPD education on self care/management/and exacerbation prevention Provided instruction about proper use  of medications used for management of COPD including inhalers Advised patient to self assesses COPD action plan zone and make appointment with provider if in the yellow zone for 48 hours without improvement Advised patient to engage in light exercise as tolerated 3-5 days a week to aid in the the management of COPD Provided education about and advised patient to utilize infection  prevention strategies to reduce risk of respiratory infection Discussed the importance of adequate rest and management of fatigue with COPD Screening for signs and symptoms of depression related to chronic disease state  Assessed social determinant of health barriers Encouraged pt to use oxygen as prescribed Take medications as prescribed   Attend all scheduled provider appointments Call pharmacy for medication refills 3-7 days in advance of running out of medications Attend church or other social activities Perform all self care activities independently  Perform IADL's (shopping, preparing meals, housekeeping, managing finances) independently Call provider office for new concerns or questions  call office if I gain more than 2 pounds in one day or 5 pounds in one week keep legs up while sitting use salt in moderation watch for swelling in feet, ankles and legs every day weigh myself daily develop a rescue plan follow rescue plan if symptoms flare-up eat more whole grains, fruits and vegetables, lean meats and healthy fats know when to call the doctor- weight gain 3 pounds overnight or 5 pounds in one week, swelling in feet, increased shortness of breath dress right for the weather, hot or cold Go to ED for unrelieved shortness of breath or chest pain Follow low sodium diet- read food labels for sodium content Look over education sent via my chart- Heart Failure action plan  Interaction and coordination with outside resources, practitioners, and providers See CCM Referral  Care Plan: Available in MyChart

## 2022-11-29 NOTE — Progress Notes (Signed)
New referral placed for the patient

## 2022-11-29 NOTE — Chronic Care Management (AMB) (Signed)
Chronic Care Management   CCM RN Visit Note  11/29/2022 Name: Craig Hurley MRN: [id number] DOB: November 29, 1945  Subjective: Craig Hurley is a 77 y.o. year old male who is a primary care patient of Dettinger, Fransisca Kaufmann, MD. The patient was referred to the Chronic Care Management team for assistance with care management needs subsequent to provider initiation of CCM services and plan of care.    Today's Visit:  Engaged with patient by telephone for initial visit.     SDOH Interventions Today    Flowsheet Row Most Recent Value  SDOH Interventions   Food Insecurity Interventions Intervention Not Indicated  Housing Interventions Intervention Not Indicated  Transportation Interventions Intervention Not Indicated  Utilities Interventions Intervention Not Indicated  Financial Strain Interventions Intervention Not Indicated  Physical Activity Interventions Patient Refused  [pt states not interested]  Stress Interventions Intervention Not Indicated  Social Connections Interventions Patient Refused         Goals Addressed             This Visit's Progress    CCM (CONGESTIVE HEART FAILURE) EXPECTED OUTCOME: MONITOR, SELF-MANAGE AND REDUCE SYMPTOMS OF CONGESTIVE HEART FAILURE       Current Barriers:  Knowledge Deficits related to Congestive Heart Failure management Chronic Disease Management support and education needs related to Congestive Heart Failure Patient and spouse report pt does have a scale but does not weigh, states this is something most likely won't do.    Planned Interventions: Basic overview and discussion of pathophysiology of Heart Failure reviewed Provided education on low sodium diet Reviewed Heart Failure Action Plan in depth and provided written copy Assessed need for readable accurate scales in home Provided education about placing scale on hard, flat surface Advised patient to weigh each morning after emptying bladder Discussed importance of daily weight  and advised patient to weigh and record daily Discussed the importance of keeping all appointments with provider Advised patient to discuss any issues with managing CHF, side effects of medication  with provider Screening for signs and symptoms of depression related to chronic disease state  Assessed social determinant of health barriers Reviewed importance of following low sodium diet  Symptom Management: Take medications as prescribed   Attend all scheduled provider appointments Call pharmacy for medication refills 3-7 days in advance of running out of medications Attend church or other social activities Perform all self care activities independently  Perform IADL's (shopping, preparing meals, housekeeping, managing finances) independently Call provider office for new concerns or questions  call office if I gain more than 2 pounds in one day or 5 pounds in one week keep legs up while sitting use salt in moderation watch for swelling in feet, ankles and legs every day weigh myself daily develop a rescue plan follow rescue plan if symptoms flare-up eat more whole grains, fruits and vegetables, lean meats and healthy fats know when to call the doctor- weight gain 3 pounds overnight or 5 pounds in one week, swelling in feet, increased shortness of breath dress right for the weather, hot or cold Go to ED for unrelieved shortness of breath or chest pain Follow low sodium diet- read food labels for sodium content Look over education sent via my chart- Heart Failure action plan  Follow Up Plan: Telephone follow up appointment with care management team member scheduled for:  01/08/23 at 1045 am       CCM (COPD) EXPECTED OUTCOME: MONITOR, SELF-MANAGE AND REDUCE SYMPTOMS OF COPD       Current  Barriers:  Knowledge Deficits related to COPD management Chronic Disease Management support and education needs related to COPD, oxygen use and safety Spoke with patient and spouse together, pt reports  he is supposed to use oxygen at 2 liters hs only, states he has not used in the last 2-3 months citing he just does not want to use it. Pt and spouse reports pt is using all inhalers as prescribed.  Planned Interventions: Provided patient with basic written and verbal COPD education on self care/management/and exacerbation prevention Provided instruction about proper use of medications used for management of COPD including inhalers Advised patient to self assesses COPD action plan zone and make appointment with provider if in the yellow zone for 48 hours without improvement Advised patient to engage in light exercise as tolerated 3-5 days a week to aid in the the management of COPD Provided education about and advised patient to utilize infection prevention strategies to reduce risk of respiratory infection Discussed the importance of adequate rest and management of fatigue with COPD Screening for signs and symptoms of depression related to chronic disease state  Assessed social determinant of health barriers Encouraged pt to use oxygen as prescribed  Symptom Management: Take medications as prescribed   Attend all scheduled provider appointments Call pharmacy for medication refills 3-7 days in advance of running out of medications Attend church or other social activities Perform all self care activities independently  Perform IADL's (shopping, preparing meals, housekeeping, managing finances) independently Call provider office for new concerns or questions  identify and remove indoor air pollutants limit outdoor activity during cold weather listen for public air quality announcements every day do breathing exercises every day develop a rescue plan eliminate symptom triggers at home follow rescue plan if symptoms flare-up get at least 7 to 8 hours of sleep at night practice relaxation or meditation daily Look over education sent via my chart- COPD action plan Please use oxygen as  prescribed  Follow Up Plan: Telephone follow up appointment with care management team member scheduled for:  01/08/23 at 1045 am          Plan:Telephone follow up appointment with care management team member scheduled for:  01/08/23 at [place] am  Jacqlyn Larsen [facility], BSN RN Case Manager [place] [phone]

## 2022-12-08 DIAGNOSIS — R002 Palpitations: Secondary | ICD-10-CM | POA: Diagnosis not present

## 2022-12-11 ENCOUNTER — Telehealth: Payer: Self-pay | Admitting: Emergency Medicine

## 2022-12-11 NOTE — Telephone Encounter (Signed)
Called and spoke to wife and she states that her and Craig Hurley are in the process of moving and he is wanting to cancel the CT scan for March.   Sir are you ok if we cancel this scan?  Please advise sir

## 2022-12-12 NOTE — Telephone Encounter (Signed)
Ct is cancelled

## 2022-12-12 NOTE — Telephone Encounter (Signed)
Yes  - but I think he needs to establish with a pulmonologist at his new location so they will know about his small pulmonary nodules and can arrange for appropriate follow-up.  He does likely need a CT at his new location in March.

## 2022-12-12 NOTE — Telephone Encounter (Signed)
Called and spoke to wife about Dr Lamonte Sakai recommendations and she states once they get moved to new location they will search for a pulmonary dr and a new PCP. I did tell her Dr Lamonte Sakai does recommend a CT scan in March.   [facility] can we cancel the CT scan that is for March of this year.   Please and thank you

## 2022-12-13 ENCOUNTER — Telehealth: Payer: Medicare HMO

## 2022-12-16 ENCOUNTER — Other Ambulatory Visit: Payer: Self-pay | Admitting: Cardiology

## 2022-12-19 ENCOUNTER — Ambulatory Visit (HOSPITAL_COMMUNITY): Payer: Medicare HMO | Attending: Cardiology

## 2022-12-19 DIAGNOSIS — I5042 Chronic combined systolic (congestive) and diastolic (congestive) heart failure: Secondary | ICD-10-CM | POA: Diagnosis not present

## 2022-12-19 LAB — ECHOCARDIOGRAM COMPLETE
Area-P 1/2: 3.74 cm2
S' Lateral: 3.3 cm

## 2022-12-22 ENCOUNTER — Ambulatory Visit: Payer: Medicare HMO | Admitting: Pharmacist

## 2022-12-22 ENCOUNTER — Other Ambulatory Visit: Payer: Self-pay | Admitting: Family Medicine

## 2022-12-22 DIAGNOSIS — J441 Chronic obstructive pulmonary disease with (acute) exacerbation: Secondary | ICD-10-CM

## 2022-12-22 DIAGNOSIS — E1165 Type 2 diabetes mellitus with hyperglycemia: Secondary | ICD-10-CM

## 2022-12-24 ENCOUNTER — Other Ambulatory Visit: Payer: Self-pay | Admitting: Cardiology

## 2023-01-08 ENCOUNTER — Ambulatory Visit: Payer: Medicare HMO | Admitting: *Deleted

## 2023-01-08 NOTE — Chronic Care Management (AMB) (Signed)
Chronic Care Management   CCM RN Visit Note  01/08/2023 Name: Craig Hurley MRN: [id number] DOB: December 13, 1945  Subjective: Craig Hurley is a 77 y.o. year old male who is a primary care patient of Dettinger, Fransisca Kaufmann, MD. The patient was referred to the Chronic Care Management team for assistance with care management needs subsequent to provider initiation of CCM services and plan of care.    Today's Visit:  Engaged with patient by telephone for follow up visit.        Goals Addressed             This Visit's Progress    COMPLETED: CCM (CONGESTIVE HEART FAILURE) EXPECTED OUTCOME: MONITOR, SELF-MANAGE AND REDUCE SYMPTOMS OF CONGESTIVE HEART FAILURE       Current Barriers:  Knowledge Deficits related to Congestive Heart Failure management Chronic Disease Management support and education needs related to Congestive Heart Failure Patient and spouse report pt does have a scale but does not weigh, states this is something most likely won't do.   Patient/ spouse report they are in process of moving to [place] and hope to be moved within the next few weeks, requests case closure today  Planned Interventions: Provided education on low sodium diet Assessed need for readable accurate scales in home Provided education about placing scale on hard, flat surface Advised patient to weigh each morning after emptying bladder Discussed importance of daily weight and advised patient to weigh and record daily Reviewed role of diuretics in prevention of fluid overload and management of heart failure Discussed the importance of keeping all appointments with provider Advised patient to discuss any issues with managing CHF, side effects of medication  with provider Reinforced importance of following low sodium diet and reading labels Reinforced Heart Failure action plan  Symptom Management: Take medications as prescribed   Attend all scheduled provider appointments Call pharmacy for  medication refills 3-7 days in advance of running out of medications Attend church or other social activities Perform all self care activities independently  Perform IADL's (shopping, preparing meals, housekeeping, managing finances) independently Call provider office for new concerns or questions  call office if I gain more than 2 pounds in one day or 5 pounds in one week do ankle pumps when sitting keep legs up while sitting use salt in moderation watch for swelling in feet, ankles and legs every day weigh myself daily develop a rescue plan follow rescue plan if symptoms flare-up eat more whole grains, fruits and vegetables, lean meats and healthy fats know when to call the doctor- weight gain 3 pounds overnight or 5 pounds in one week, swelling in feet, increased shortness of breath dress right for the weather, hot or cold Go to ED for unrelieved shortness of breath or chest pain Follow low sodium diet- read food labels for sodium content Follow Heart Failure action plan Case closure today (per request and moving to [place])  Follow Up Plan: The patient has been provided with contact information for the care management team and has been advised to call with any health related questions or concerns.  No further follow up required: pt/ spouse request case closure today due to moving to [place] within the next few weeks          COMPLETED: CCM (COPD) EXPECTED OUTCOME: MONITOR, SELF-MANAGE AND REDUCE SYMPTOMS OF COPD       Current Barriers:  Knowledge Deficits related to COPD management Chronic Disease Management support and education needs related to COPD, oxygen use and safety  Spoke with patient and spouse together, pt reports he is supposed to use oxygen at 2 liters hs only, states he has not used in the last 2-3 months citing he just does not want to use it. Pt and spouse reports pt is using all inhalers as prescribed, reports oxygen equipment is being picked up and pt will  sign AMA release form because pt is not going to use oxygen  Planned Interventions: Advised patient to track and manage COPD triggers Provided instruction about proper use of medications used for management of COPD including inhalers Advised patient to self assesses COPD action plan zone and make appointment with provider if in the yellow zone for 48 hours without improvement Advised patient to engage in light exercise as tolerated 3-5 days a week to aid in the the management of COPD Provided education about and advised patient to utilize infection prevention strategies to reduce risk of respiratory infection Discussed the importance of adequate rest and management of fatigue with COPD Encouraged pt to use oxygen as prescribed  Symptom Management: Take medications as prescribed   Attend all scheduled provider appointments Call pharmacy for medication refills 3-7 days in advance of running out of medications Attend church or other social activities Perform all self care activities independently  Perform IADL's (shopping, preparing meals, housekeeping, managing finances) independently Call provider office for new concerns or questions  identify and remove indoor air pollutants limit outdoor activity during cold weather listen for public air quality announcements every day do breathing exercises every day develop a rescue plan eliminate symptom triggers at home follow rescue plan if symptoms flare-up get at least 7 to 8 hours of sleep at night practice relaxation or meditation daily do breathing exercises every day Follow COPD action plan Please use oxygen as prescribed  Follow Up Plan: The patient has been provided with contact information for the care management team and has been advised to call with any health related questions or concerns.  No further follow up required: Case closure today, pt is moving out of state and requests discharge             Plan:The patient has been  provided with contact information for the care management team and has been advised to call with any health related questions or concerns.  No further follow up required: patient is moving out of states within a few weeks and requests discharge today  Jacqlyn Larsen [facility], BSN RN Case Manager [place] [phone]

## 2023-01-08 NOTE — Patient Instructions (Signed)
Please call the care guide team at [phone] if you need to cancel or reschedule your appointment.   If you are experiencing a Mental Health or [place] or need someone to talk to, please call the Suicide and Crisis Lifeline: 988 call the [place] National Suicide Prevention Lifeline: (530) 509-8794 or TTY: (617)792-9924 TTY 267 481 7059) to talk to a trained counselor call 1-800-273-TALK (toll free, 24 hour hotline) go to [facility] [address] [place] [phone]) call the [facility]: [phone] call 911   Following is a copy of the CCM Program Consent:  CCM service includes personalized support from designated clinical staff supervised by the physician, including individualized plan of care and coordination with other care providers 24/7 contact phone numbers for assistance for urgent and routine care needs. Service will only be billed when office clinical staff spend 20 minutes or more in a month to coordinate care. Only one practitioner may furnish and bill the service in a calendar month. The patient may stop CCM services at amy time (effective at the end of the month) by phone call to the office staff. The patient will be responsible for cost sharing (co-pay) or up to 20% of the service fee (after annual deductible is met)  Following is a copy of your full provider care plan:   Goals Addressed             This Visit's Progress    COMPLETED: CCM ([place]) EXPECTED OUTCOME: MONITOR, SELF-MANAGE AND REDUCE SYMPTOMS OF CONGESTIVE HEART FAILURE       Current Barriers:  Knowledge Deficits related to Congestive Heart Failure management Chronic Disease Management support and education needs related to Congestive Heart Failure Patient and spouse report pt does have a scale but does not weigh, states this is something most likely won't do.   Patient/ spouse report they are in process of  moving to [place] and hope to be moved within the next few weeks, requests case closure today  Planned Interventions: Provided education on low sodium diet Assessed need for readable accurate scales in home Provided education about placing scale on hard, flat surface Advised patient to weigh each morning after emptying bladder Discussed importance of daily weight and advised patient to weigh and record daily Reviewed role of diuretics in prevention of fluid overload and management of heart failure Discussed the importance of keeping all appointments with provider Advised patient to discuss any issues with managing CHF, side effects of medication  with provider Reinforced importance of following low sodium diet and reading labels Reinforced Heart Failure action plan  Symptom Management: Take medications as prescribed   Attend all scheduled provider appointments Call pharmacy for medication refills 3-7 days in advance of running out of medications Attend church or other social activities Perform all self care activities independently  Perform IADL's (shopping, preparing meals, housekeeping, managing finances) independently Call provider office for new concerns or questions  call office if I gain more than 2 pounds in one day or 5 pounds in one week do ankle pumps when sitting keep legs up while sitting use salt in moderation watch for swelling in feet, ankles and legs every day weigh myself daily develop a rescue plan follow rescue plan if symptoms flare-up eat more whole grains, fruits and vegetables, lean meats and healthy fats know when to call the doctor- weight gain 3 pounds overnight or 5 pounds in one week, swelling in feet, increased shortness of breath dress right for  the weather, hot or cold Go to ED for unrelieved shortness of breath or chest pain Follow low sodium diet- read food labels for sodium content Follow Heart Failure action plan Case closure today (per  request and moving to [place])  Follow Up Plan: The patient has been provided with contact information for the care management team and has been advised to call with any health related questions or concerns.  No further follow up required: pt/ spouse request case closure today due to moving to [place] within the next few weeks          COMPLETED: CCM (COPD) EXPECTED OUTCOME: MONITOR, SELF-MANAGE AND REDUCE SYMPTOMS OF COPD       Current Barriers:  Knowledge Deficits related to COPD management Chronic Disease Management support and education needs related to COPD, oxygen use and safety Spoke with patient and spouse together, pt reports he is supposed to use oxygen at 2 liters hs only, states he has not used in the last 2-3 months citing he just does not want to use it. Pt and spouse reports pt is using all inhalers as prescribed, reports oxygen equipment is being picked up and pt will sign AMA release form because pt is not going to use oxygen  Planned Interventions: Advised patient to track and manage COPD triggers Provided instruction about proper use of medications used for management of COPD including inhalers Advised patient to self assesses COPD action plan zone and make appointment with provider if in the yellow zone for 48 hours without improvement Advised patient to engage in light exercise as tolerated 3-5 days a week to aid in the the management of COPD Provided education about and advised patient to utilize infection prevention strategies to reduce risk of respiratory infection Discussed the importance of adequate rest and management of fatigue with COPD Encouraged pt to use oxygen as prescribed  Symptom Management: Take medications as prescribed   Attend all scheduled provider appointments Call pharmacy for medication refills 3-7 days in advance of running out of medications Attend church or other social activities Perform all self care activities independently   Perform IADL's (shopping, preparing meals, housekeeping, managing finances) independently Call provider office for new concerns or questions  identify and remove indoor air pollutants limit outdoor activity during cold weather listen for public air quality announcements every day do breathing exercises every day develop a rescue plan eliminate symptom triggers at home follow rescue plan if symptoms flare-up get at least 7 to 8 hours of sleep at night practice relaxation or meditation daily do breathing exercises every day Follow COPD action plan Please use oxygen as prescribed  Follow Up Plan: The patient has been provided with contact information for the care management team and has been advised to call with any health related questions or concerns.  No further follow up required: Case closure today, pt is moving out of state and requests discharge             Patient verbalizes understanding of instructions and care plan provided today and agrees to view in [place]. Active MyChart status and patient understanding of how to access instructions and care plan via MyChart confirmed with patient.     The patient has been provided with contact information for the care management team and has been advised to call with any health related questions or concerns.  No further follow up required: case closure today, pt is moving out of state within next few weeks, requests discharge today

## 2023-01-09 ENCOUNTER — Telehealth: Payer: Self-pay | Admitting: Pharmacist

## 2023-01-09 DIAGNOSIS — J438 Other emphysema: Secondary | ICD-10-CM

## 2023-01-09 MED ORDER — BREZTRI AEROSPHERE 160-9-4.8 MCG/ACT IN AERO: 2.0000 | INHALATION_SPRAY | Freq: Two times a day (BID) | RESPIRATORY_TRACT | 11 refills | Status: DC

## 2023-01-09 NOTE — Progress Notes (Signed)
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  Echocardiogram on 04/12/2021 showed LVEF 45 to 50%  COPD on symbicort and spiriva--will transition to breztri for ease of patient assistance Assessed patient finances. Will re-enroll in BI cares patient assistance for jardiance; will also enroll for breztri inhaler (symbicort no longer available via patient assistance program; medication ships to patient's home   Patient Goals/Self-Care Activities Over the next 90 days, patient will:  - take medications as prescribed collaborate with provider on medication access solutions  Follow Up Plan: Telephone follow up appointment with care management team member scheduled for: 3 months   Subjective: Craig Hurley is an 77 y.o. year old male who is a primary patient of Dettinger, Fransisca Kaufmann, MD.  The patient was referred to the Chronic Care Management team for assistance with care management needs subsequent to provider initiation of CCM services and plan of care.    Engaged with patient by telephone for follow up visit in response to provider referral for CCM services.   Objective:  LABS:    Lab Results  Component Value Date   CREATININE 0.92 11/09/2022   CREATININE 0.95 09/04/2022   CREATININE 1.04 08/08/2022     Lab Results  Component Value Date   HGBA1C 6.0 (H) 11/09/2022         Component Value Date/Time   CHOL 155 11/09/2022 1017   TRIG 103 11/09/2022 1017   HDL 53 11/09/2022 1017   CHOLHDL 2.9 11/09/2022 1017   LDLCALC 83 11/09/2022 1017     Clinical ASCVD: Yes   The ASCVD Risk score (Arnett DK, et al., 2019) failed to calculate for the following reasons:   The patient has a prior MI or  stroke diagnosis    Other: (CHADS2VASc if Afib, PHQ9 if depression, MMRC or CAT for COPD, ACT, DEXA)    BP Readings from Last 3 Encounters:  11/15/22 100/70  11/09/22 109/74  09/26/22 104/67      SDOH:  (Social Determinants of Health) assessments and interventions performed:    Allergies  Allergen Reactions   Penicillins Swelling    Medications Reviewed Today     Reviewed by Lavera Guise, [facility] (Pharmacist) on 01/09/23 at [place] List Status: <None>   Medication Order Taking? Sig Documenting Provider Last Dose Status Informant  acetaminophen (TYLENOL) 325 MG tablet [id number] No Take 650 mg by mouth every 6 (six) hours as needed. [provider] Taking Active   albuterol (VENTOLIN HFA) 108 (90 Base) MCG/ACT inhaler [id number] No Inhale 2 puffs into the lungs every 4 (four) hours as needed for wheezing or shortness of breath. Collene Gobble, MD Taking Active   aspirin EC 325 MG tablet [id number] No Take 1 tablet (325 mg total) by mouth daily. Marcial Pacas, MD Taking Active   atorvastatin (LIPITOR) 20 MG tablet [id number] No Take 1 tablet (20 mg total) by mouth daily. Donato Heinz, MD Taking Active   Budeson-Glycopyrrol-Formoterol [facility] AEROSPHERE) 160-9-4.8 MCG/ACT Hollie Salk [id number]  Inhale 2 puffs into the lungs 2 (two) times daily. Dettinger, Fransisca Kaufmann, MD  Active   carvedilol (COREG) 3.125 MG tablet [id number] No Take 1 tablet by mouth twice daily Donato Heinz, MD Taking Active   cetirizine (ZYRTEC) 10 MG  tablet [id number] No Take 1 tablet (10 mg total) by mouth daily. Dettinger, Fransisca Kaufmann, MD Taking Active   Cyanocobalamin (B-12 PO) [id number] No Take 1,000 mcg by mouth daily. [provider] Taking Active   doxazosin (CARDURA) 4 MG tablet [id number] No Take 1 tablet (4 mg total) by mouth daily. Dettinger, Fransisca Kaufmann, MD Taking Active   empagliflozin (JARDIANCE) 10 MG TABS tablet [id number] No Take 1 tablet (10 mg total) by mouth daily before breakfast.  Donato Heinz, MD Taking Active            Med Note Blanca Friend, Sherian Maroon Jul 28, 2021 10:55 AM) VIA BI CARES PATIENT ASSISTANCE   latanoprost (XALATAN) 0.005 % ophthalmic solution [id number] No Place 1 drop into the left eye at bedtime. [provider] Taking Active   meloxicam (MOBIC) 15 MG tablet [id number] No TAKE 1 TABLET EVERY DAY Dettinger, Fransisca Kaufmann, MD Taking Active   montelukast (SINGULAIR) 10 MG tablet [id number] No TAKE 1 TABLET AT BEDTIME Dettinger, Fransisca Kaufmann, MD Taking Active   Multiple Vitamins-Minerals (PRESERVISION AREDS 2 PO) [id number] No Take 1 capsule by mouth 2 (two) times daily. [provider] Taking Active   omeprazole (PRILOSEC) 20 MG capsule [id number] No TAKE 1 CAPSULE TWICE DAILY BEFORE MEALS Dettinger, Fransisca Kaufmann, MD Taking Active   sacubitril-valsartan [facility]) 24-26 MG [id number] No Take 1 tablet by mouth twice daily Donato Heinz, MD Taking Active   spironolactone (ALDACTONE) 25 MG tablet [id number] No Take 1/2 (one-half) tablet by mouth once daily Donato Heinz, MD Taking Active   vitamin A 8000 UNIT capsule [id number] No Take 8,000 Units by mouth daily. [provider] Taking Active               Goals Addressed               This Visit's Progress     Patient Stated     CHF, COPD PHARMD (pt-stated)        Current Barriers:  Unable to independently afford treatment regimen  Pharmacist Clinical Goal(s):  Over the next 90 days, patient will verbalize ability to afford treatment regimen through collaboration with PharmD and provider.   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  Echocardiogram on 04/12/2021 showed LVEF 45 to 50%  COPD on symbicort and spiriva--will transition to breztri for ease of patient assistance Assessed patient finances. Will re-enroll in BI cares patient assistance for jardiance; will also enroll for breztri inhaler (symbicort no longer available via patient assistance program; medication ships to patient's home   Patient Goals/Self-Care Activities Over the next 90 days, patient will:  - take medications as prescribed collaborate with provider on medication access solutions  Follow Up Plan: Telephone follow up appointment with care management team member scheduled for: 3 months          Regina Eck, PharmD, BCPS, BCACP Clinical Pharmacist, [place]  [facility]  T [phone]

## 2023-01-09 NOTE — Telephone Encounter (Signed)
Can we make sure patient is enrolled for breztri? Previously on symbicort Email app to me so we can get rolling Breztri escribed to [employer] Med list updated to reflect (spriva + symbicort-->breztri)  Thanks!

## 2023-01-11 ENCOUNTER — Other Ambulatory Visit (HOSPITAL_COMMUNITY): Payer: Self-pay

## 2023-01-19 NOTE — Telephone Encounter (Signed)
Submitted application for BREZTRI to [employer] for patient assistance.   Phone: [phone]

## 2023-01-25 NOTE — Telephone Encounter (Signed)
Received notification from [employer] regarding approval for [place]. Patient assistance approved from 01/24/23 to 11/27/23.  MEDICATION WILL SHIP TO PATIENTS HOME  Phone: [phone]

## 2023-02-05 ENCOUNTER — Other Ambulatory Visit (HOSPITAL_COMMUNITY): Payer: Medicare HMO

## 2023-02-09 ENCOUNTER — Other Ambulatory Visit: Payer: Self-pay | Admitting: Family Medicine

## 2023-05-07 ENCOUNTER — Encounter: Payer: Self-pay | Admitting: Cardiology

## 2023-05-11 ENCOUNTER — Ambulatory Visit: Payer: Medicare HMO | Admitting: Family Medicine

## 2023-07-20 ENCOUNTER — Telehealth: Payer: Self-pay | Admitting: Pharmacist

## 2023-07-20 DIAGNOSIS — J438 Other emphysema: Secondary | ICD-10-CM

## 2023-07-20 MED ORDER — BREZTRI AEROSPHERE 160-9-4.8 MCG/ACT IN AERO
2.0000 | INHALATION_SPRAY | Freq: Two times a day (BID) | RESPIRATORY_TRACT | 5 refills | Status: DC
Start: 2023-07-20 — End: 2023-07-20

## 2023-07-20 NOTE — Telephone Encounter (Signed)
    07/20/23  Patient enrolled in the [place] patient assistance program for [facility].   He is now seeing another PCP that is not at our practice Will cancel [employer] patient assistance program Will route to CPhT to remove from our PAP list   Kieth Brightly, PharmD, BCACP Clinical Pharmacist, [facility]

## 2023-10-26 ENCOUNTER — Other Ambulatory Visit: Payer: Self-pay | Admitting: Family Medicine

## 2023-12-23 ENCOUNTER — Other Ambulatory Visit: Payer: Self-pay | Admitting: Family Medicine

## 2024-01-07 ENCOUNTER — Other Ambulatory Visit: Payer: Self-pay | Admitting: Family Medicine
